# Patient Record
Sex: Female | Born: 1935 | Race: Black or African American | Hispanic: No
Health system: Midwestern US, Community
[De-identification: ages and names within clinical notes are randomized; demographics above are authoritative.]

## PROBLEM LIST (undated history)

## (undated) DIAGNOSIS — R609 Edema, unspecified: Secondary | ICD-10-CM

## (undated) DIAGNOSIS — R935 Abnormal findings on diagnostic imaging of other abdominal regions, including retroperitoneum: Secondary | ICD-10-CM

## (undated) DIAGNOSIS — G40209 Localization-related (focal) (partial) symptomatic epilepsy and epileptic syndromes with complex partial seizures, not intractable, without status epilepticus: Secondary | ICD-10-CM

## (undated) DIAGNOSIS — K319 Disease of stomach and duodenum, unspecified: Secondary | ICD-10-CM

## (undated) DIAGNOSIS — E785 Hyperlipidemia, unspecified: Secondary | ICD-10-CM

## (undated) DIAGNOSIS — E119 Type 2 diabetes mellitus without complications: Secondary | ICD-10-CM

## (undated) DIAGNOSIS — L89152 Pressure ulcer of sacral region, stage 2: Secondary | ICD-10-CM

## (undated) DIAGNOSIS — F411 Generalized anxiety disorder: Secondary | ICD-10-CM

## (undated) DIAGNOSIS — C50912 Malignant neoplasm of unspecified site of left female breast: Secondary | ICD-10-CM

## (undated) DIAGNOSIS — H409 Unspecified glaucoma: Secondary | ICD-10-CM

## (undated) DIAGNOSIS — I10 Essential (primary) hypertension: Secondary | ICD-10-CM

## (undated) DIAGNOSIS — Z853 Personal history of malignant neoplasm of breast: Secondary | ICD-10-CM

## (undated) DIAGNOSIS — Z86718 Personal history of other venous thrombosis and embolism: Secondary | ICD-10-CM

## (undated) DIAGNOSIS — Z794 Long term (current) use of insulin: Secondary | ICD-10-CM

## (undated) DIAGNOSIS — N390 Urinary tract infection, site not specified: Principal | ICD-10-CM

## (undated) DIAGNOSIS — G459 Transient cerebral ischemic attack, unspecified: Secondary | ICD-10-CM

## (undated) DIAGNOSIS — C50919 Malignant neoplasm of unspecified site of unspecified female breast: Secondary | ICD-10-CM

## (undated) DIAGNOSIS — M25561 Pain in right knee: Secondary | ICD-10-CM

## (undated) DIAGNOSIS — L299 Pruritus, unspecified: Secondary | ICD-10-CM

## (undated) DIAGNOSIS — L89309 Pressure ulcer of unspecified buttock, unspecified stage: Secondary | ICD-10-CM

## (undated) DIAGNOSIS — M1990 Unspecified osteoarthritis, unspecified site: Secondary | ICD-10-CM

## (undated) HISTORY — DX: Long term (current) use of insulin: Z79.4

## (undated) HISTORY — PX: CHOLECYSTECTOMY: SHX55

## (undated) HISTORY — DX: Pain in right knee: M25.561

## (undated) HISTORY — DX: Malignant neoplasm of unspecified site of unspecified female breast: C50.919

## (undated) HISTORY — DX: Generalized anxiety disorder: F41.1

## (undated) HISTORY — DX: Essential (primary) hypertension: I10

## (undated) HISTORY — DX: Type 2 diabetes mellitus without complications: E11.9

## (undated) HISTORY — PX: MASTECTOMY: SHX3

## (undated) HISTORY — DX: Unspecified osteoarthritis, unspecified site: M19.90

## (undated) HISTORY — DX: Unspecified glaucoma: H40.9

## (undated) HISTORY — PX: MEDIAL PARTIAL KNEE REPLACEMENT: SHX5965

## (undated) HISTORY — DX: Personal history of malignant neoplasm of breast: Z85.3

## (undated) HISTORY — PX: BREAST BIOPSY: SHX20

## (undated) HISTORY — DX: Pressure ulcer of sacral region, stage 2: L89.152

## (undated) HISTORY — DX: Pruritus, unspecified: L29.9

## (undated) HISTORY — DX: Malignant neoplasm of unspecified site of left female breast: C50.912

## (undated) HISTORY — DX: Hyperlipidemia, unspecified: E78.5

## (undated) HISTORY — DX: Urinary tract infection, site not specified: N39.0

## (undated) HISTORY — PX: ABDOMINAL HYSTERECTOMY: SHX81

## (undated) HISTORY — DX: Personal history of other venous thrombosis and embolism: Z86.718

---

## 1979-02-13 DIAGNOSIS — Z86718 Personal history of other venous thrombosis and embolism: Secondary | ICD-10-CM

## 1979-02-13 HISTORY — DX: Personal history of other venous thrombosis and embolism: Z86.718

## 2010-09-25 LAB — AMB POC PVR, MEAS,POST-VOID RES,US,NON-IMAGING: PVR: 200 cc

## 2010-09-25 LAB — AMB POC URINALYSIS DIP STICK AUTO W/O MICRO
Bilirubin (UA POC): NEGATIVE
Glucose (UA POC): NEGATIVE
Nitrites (UA POC): POSITIVE
Specific gravity (UA POC): 1.02 (ref 1.001–1.035)
Urobilinogen (UA POC): 0.2
pH (UA POC): 5.5 (ref 4.6–8.0)

## 2010-09-25 NOTE — Patient Instructions (Addendum)
MyChart Activation    Thank you for requesting access to MyChart. Please follow the instructions below to securely access and download your online medical record. MyChart allows you to send messages to your doctor, view your test results, renew your prescriptions, schedule appointments, and more.    How Do I Sign Up?    1. In your internet browser, go to [email or web address]  2. Click on the First Time User? Click Here link in the Sign In box. You will be redirect to the New Member Sign Up page.  3. Enter your MyChart Access Code exactly as it appears below. You will not need to use this code after you???ve completed the sign-up process. If you do not sign up before the expiration date, you must request a new code.    MyChart Access Code: [email or web address]  Expires: 12/24/2010 12:30 PM (This is the date your MyChart access code will expire)    4. Enter the last four digits of your Social Security Number (xxxx) and Date of Birth (mm/dd/yyyy) as indicated and click Submit. You will be taken to the next sign-up page.  5. Create a MyChart ID. This will be your MyChart login ID and cannot be changed, so think of one that is secure and easy to remember.  6. Create a MyChart password. You can change your password at any time.  7. Enter your Password Reset Question and Answer. This can be used at a later time if you forget your password.   8. Enter your e-mail address. You will receive e-mail notification when new information is available in MyChart.  9. Click Sign Up. You can now view and download portions of your medical record.  10. Click the Download Summary menu link to download a portable copy of your medical information.    Additional Information    If you have questions, please call [phone]. Remember, MyChart is NOT to be used for urgent needs. For medical emergencies, dial 911.    Have a great weekend!

## 2010-09-25 NOTE — Progress Notes (Signed)
HPI     Karen James is a 77 y.o. female  with incontinence YES ,  voiding dysfunction NO. referred by:  Karen Hides, MD  Voids 9/day, 0/night.   Describes urgency YES, urge incontinence YES.   Describes incontinence of stress variety YES. Wears 1 pads/day.Volume per leak is small to moderate.  Voiding symptoms are none.  Nocturnal enuresis YES. Dysuria YES, hematuria NO, stones NO, utis YES. Feels she does notempty well .     Other symptoms include.     Has had other GYN surgery:NO  Hysterectomy; [id number]    There is no problem list on file for this patient.    Current Outpatient Prescriptions   Medication Sig Dispense Refill   ??? hydrALAZINE (APRESOLINE) 50 mg tablet Take 25 mg by mouth three (3) times daily.         ??? torsemide (DEMADEX) 5 mg tablet Take  by mouth daily.         ??? atorvastatin (LIPITOR) 20 mg tablet Take  by mouth daily.         ??? lisinopril (PRINIVIL, ZESTRIL) 20 mg tablet Take  by mouth daily.         ??? cilostazol (PLETAL) 100 mg tablet Take  by mouth Before breakfast and dinner.         ??? metoprolol (LOPRESSOR) 50 mg tablet Take  by mouth two (2) times a day.         ??? clonazePAM (KLONOPIN) 0.5 mg tablet Take  by mouth nightly as needed.         ??? aspirin 81 mg tablet Take 81 mg by mouth.         ??? insulin mixture 75-25 (HUMALOG MIX 75-25) 100 unit/mL (75-25) Susp by SubCUTAneous route once.           No Known Allergies  Past Medical History   Diagnosis Date   ??? Hypertension    ??? Diabetes mellitus    ??? PVD (peripheral vascular disease)    ??? Breast cancer      Past Surgical History   Procedure Date   ??? Hx coronary artery bypass graft    ??? Hx cholecystectomy    ??? Hx other surgical      Bilat Legs bypass   ??? Hx mastectomy      Left Breast     History     Social History   ??? Marital Status: Widowed     Spouse Name: N/A     Number of Children: N/A   ??? Years of Education: N/A     Occupational History   ??? Not on file.     Social History Main Topics   ??? Smoking status: Never Smoker     ??? Smokeless tobacco: Never Used   ??? Alcohol Use: Not on file   ??? Drug Use: Not on file   ??? Sexually Active: Not on file     Other Topics Concern   ??? Not on file     Social History Narrative   ??? No narrative on file     No family history on file.    Review of Systems    Const: Neg for fever, neg for chills, neg for weight loss, neg for change in appetite, neg for changes in energy level  Eyes: Neg for visual disturbance, neg for pain, neg for discharge  ENT: Neg for difficulty speaking, neg for pain with swallowing, neg for hearing difficulty  Resp: Neg  for Shortness of breath, neg for cough, neg for sputum, neg for hemoptysis  Cardio: Neg for chest pain, neg for rapid heartbeat, neg for irregular heartbeat  GU: Neg for history of kidney stones, neg for frequent UTI, neg for flank pain  GI: Neg for constipation, neg for diarrhea, neg for melena, neg for hematochezia  MSK: + for bone pain, neg for muscular weakness, neg for muscular tenderness  Skin: Neg for skin conditions, neg for skin rashes  Neuro: Neg for focal weakness, neg for numbness, neg for seizures  Psych: Neg for history of psychiatric illness  Endo: Neg for diabetes, neg for thyroid disease, + for excessive urination, neg for excessive thirst, neg for heat intolerance  Lymph: Neg for frequent infections, neg for easy bruising, neg for  lymph node enlargement    OBJECTIVE    General appearance: alert, cooperative, no distress, appears stated age  Neck: supple, symmetrical, trachea midline, no adenopathy, thyroid: not enlarged, symmetric, no tenderness/mass/nodules and no JVD  Lungs: Nl respirations  Heart: RRR  Abdomen: soft, non-tender.  No masses,  no organomegaly  GU Female  Ext Genitalia:within normal limits  Urethral Meatus:within normal limits  Urethra:within normal limits,  Bladder:within normal limits  Vagina:    Atrophy YES;  DC NO; lesions NO; no cystocele; no rectocele;   Cervix:within normal limits  Uterus:within normal limits       No results found for this or any previous visit.    Results for orders placed in visit on 09/25/10   AMB POC URINALYSIS DIP STICK AUTO W/O MICRO       Component Value Range    Color Yellow  (none)     Clarity Cloudy  (none)     Glucose Negative  (none)     Bilirubin Negative  (none)     Ketones Trace  (none)     Spec.Grav. 1.020  1.001 - 1.035     Blood Trace  (none)     pH 5.5  4.6 - 8.0     Protein 100mg /dL  ZOX:WRUEAVWU(JW/JX)    Urobilinogen 0.2 mg/dL      Nitrites Positive  (none)     Leukocyte esterase 4+  (none)    AMB POC PVR, MEAS,POST-VOID RES,US,NON-IMAGING       Component Value Range    PVR 200       Domitila was seen today for new patient.    Diagnoses and associated orders for this visit:    Urinary tract infection, site not specified  - AMB POC URINALYSIS DIP STICK AUTO W/O MICRO  - CULTURE, URINE  - AMB POC PVR, MEAS,POST-VOID RES,US,NON-IMAGING    Nocturnal enuresis  - AMB POC URINALYSIS DIP STICK AUTO W/O MICRO  - CULTURE, URINE  - AMB POC PVR, MEAS,POST-VOID RES,US,NON-IMAGING    Urge incontinence  - AMB POC URINALYSIS DIP STICK AUTO W/O MICRO  - CULTURE, URINE  - AMB POC PVR, MEAS,POST-VOID RES,US,NON-IMAGING    Other Orders  - hydrALAZINE (APRESOLINE) 50 mg tablet; Take 25 mg by mouth three (3) times daily.    - torsemide (DEMADEX) 5 mg tablet; Take  by mouth daily.    - atorvastatin (LIPITOR) 20 mg tablet; Take  by mouth daily.    - lisinopril (PRINIVIL, ZESTRIL) 20 mg tablet; Take  by mouth daily.    - cilostazol (PLETAL) 100 mg tablet; Take  by mouth Before breakfast and dinner.    - metoprolol (LOPRESSOR) 50 mg tablet; Take  by mouth two (  2) times a day.    - clonazePAM (KLONOPIN) 0.5 mg tablet; Take  by mouth nightly as needed.    - aspirin 81 mg tablet; Take 81 mg by mouth.    - insulin mixture 75-25 (HUMALOG MIX 75-25) 100 unit/mL (75-25) Susp; by SubCUTAneous route once.          Will await C&S results and treat then place on prophylaxis.    Given strategies for nocturia.             Alvino Chapel MD FACS           Quality Measure--Urinary Incontinence Characterization Women Aged 2 Years and Older.  Urinary incontinence needs to be characterized at least once within 12 months for patients with a diagnosis of urinary incontinence.    Urinary Incontinence Characteristics:  leaks with urgency  Quality Measure--Urinary Incontinence Characterization Women Aged 24 Years and Older.  Urinary incontinence needs to be characterized at least once within 12 months for patients with a diagnosis of urinary incontinence.    Urinary Incontinence Characteristics:  leaks with urgency

## 2010-09-28 LAB — CULTURE, URINE

## 2010-09-28 NOTE — Telephone Encounter (Signed)
Message copied by Henreitta Leber on Mon Sep 28, 2010  3:13 PM  ------       Message from: Armen Pickup       Created: Mon Sep 28, 2010  2:51 PM         Pt has a UTI with 2 bacteria. She should begin the following treatment and prevention of further UTIs.              Septra DS, #40, 1 tab twice daily for 10 days then 1 tab daily for 6 months to prevent UTIs. Use generic and 5 refills       ----- Message -----          From: Henreitta Leber          Sent: 09/28/2010   1:04 PM            To: Alvino Chapel, MD

## 2010-10-01 MED ORDER — TRIMETHOPRIM-SULFAMETHOXAZOLE 160 MG-800 MG TAB
160-800 mg | ORAL_TABLET | ORAL | Status: DC
Start: 2010-10-01 — End: 2014-01-15

## 2010-10-01 NOTE — Telephone Encounter (Signed)
Spoke to daughter Corrie Dandy and reviewed results.  Rx sent to pharmacy on file.

## 2010-10-23 LAB — AMB POC URINALYSIS DIP STICK AUTO W/ MICRO (MICRO RESULTS)
Bilirubin (UA POC): NEGATIVE
Blood (UA POC): NEGATIVE
Glucose (UA POC): NEGATIVE
Ketones (UA POC): NEGATIVE
Nitrites (UA POC): NEGATIVE
Protein (UA POC): NEGATIVE mg/dL
Specific gravity (UA POC): 1.015 (ref 1.001–1.035)
Urobilinogen (UA POC): 0.2
pH (UA POC): 5.5 (ref 4.6–8.0)

## 2010-10-23 LAB — AMB POC PVR, MEAS,POST-VOID RES,US,NON-IMAGING: PVR: 252 cc

## 2010-10-23 NOTE — Patient Instructions (Signed)
MyChart Activation    Thank you for requesting access to MyChart. Please follow the instructions below to securely access and download your online medical record. MyChart allows you to send messages to your doctor, view your test results, renew your prescriptions, schedule appointments, and more.    How Do I Sign Up?    1. In your internet browser, go to [email or web address]  2. Click on the First Time User? Click Here link in the Sign In box. You will be redirect to the New Member Sign Up page.  3. Enter your MyChart Access Code exactly as it appears below. You will not need to use this code after you???ve completed the sign-up process. If you do not sign up before the expiration date, you must request a new code.    MyChart Access Code: [email or web address]  Expires: 12/24/2010 12:30 PM (This is the date your MyChart access code will expire)    4. Enter the last four digits of your Social Security Number (xxxx) and Date of Birth (mm/dd/yyyy) as indicated and click Submit. You will be taken to the next sign-up page.  5. Create a MyChart ID. This will be your MyChart login ID and cannot be changed, so think of one that is secure and easy to remember.  6. Create a MyChart password. You can change your password at any time.  7. Enter your Password Reset Question and Answer. This can be used at a later time if you forget your password.   8. Enter your e-mail address. You will receive e-mail notification when new information is available in MyChart.  9. Click Sign Up. You can now view and download portions of your medical record.  10. Click the Download Summary menu link to download a portable copy of your medical information.    Additional Information    Remember, MyChart is NOT to be used for urgent needs. For medical emergencies, dial 911.      Have a great weekend!

## 2010-10-23 NOTE — Progress Notes (Signed)
Progress Note    Karen James is a 77 y.o. AA female with nocturia and nocturnal enuresis.  Here for review of this problem.    She had a UTI and was treated with Septra and placed on prophylaxis. Her symptoms have not improved.    PVR today is 257 ML.    UA once again is suspicious for a UTI      Assessment:     1. Recurrent UTI  2. Incomplete bladder emptying  3. Nocturia and nocturnal enuresis       Plan:     - Lengthy discussion with pt and daughter who is a Engineer, civil (consulting). Her daughter will cath her mother prior to bedtime and in the morning.   - Will send urine for C&S  - Will see them back in 4-6 weeks.    Alvino Chapel MD FACS

## 2010-10-23 NOTE — Progress Notes (Signed)
Addended by: Nemiah Commander R on: 10/23/2010 11:40 AM     Modules accepted: Orders

## 2010-10-28 NOTE — Telephone Encounter (Signed)
Message copied by Henreitta Leber on Wed Oct 28, 2010  5:18 PM  ------       Message from: Alvino Chapel       Created: Tue Oct 27, 2010  5:36 PM         No evidence of a UTI.       ----- Message -----          From: Jabier Gauss          Sent: 10/27/2010   2:58 PM            To: Alvino Chapel, MD

## 2010-10-30 NOTE — Telephone Encounter (Signed)
Patient's daughter Corrie Dandy called back and results were reviewed.  She is stating now also that she will be unable to perform the catheterizations for her mother after all.  She would like a home health nurse to be able to come in and do this for her mother.  Will talk to Dr. Gracelyn Nurse

## 2010-11-17 LAB — PTT: aPTT: 37.3 s (ref 24.6–37.7)

## 2010-11-17 LAB — METABOLIC PANEL, BASIC
Anion gap: 5 mmol/L (ref 5–15)
BUN/Creatinine ratio: 20 (ref 12–20)
BUN: 30 MG/DL — ABNORMAL HIGH (ref 7–18)
CO2: 27 MMOL/L (ref 21–32)
Calcium: 9.5 MG/DL (ref 8.4–10.4)
Chloride: 107 MMOL/L (ref 100–108)
Creatinine: 1.5 MG/DL — ABNORMAL HIGH (ref 0.6–1.3)
GFR est AA: 42 mL/min/{1.73_m2} — ABNORMAL LOW (ref >60)
GFR est non-AA: 35 mL/min/{1.73_m2} — ABNORMAL LOW (ref >60)
Glucose: 120 MG/DL — ABNORMAL HIGH (ref 74–99)
Potassium: 4.8 MMOL/L (ref 3.5–5.5)
Sodium: 139 MMOL/L (ref 136–145)

## 2010-11-17 LAB — PROTHROMBIN TIME + INR
INR: 1 (ref 0.0–1.2)
Prothrombin time: 12.6 s (ref 11.5–15.2)

## 2010-11-17 LAB — CBC WITH AUTOMATED DIFF
ABS. BASOPHILS: 0 10*3/uL (ref 0.0–0.06)
ABS. EOSINOPHILS: 0.3 10*3/uL (ref 0.0–0.4)
ABS. LYMPHOCYTES: 2.7 10*3/uL (ref 0.9–3.6)
ABS. MONOCYTES: 0.8 10*3/uL (ref 0.05–1.2)
ABS. NEUTROPHILS: 4.8 10*3/uL (ref 1.8–8.0)
BASOPHILS: 0 % (ref 0–2)
EOSINOPHILS: 3 % (ref 0–5)
HCT: 34.8 % — ABNORMAL LOW (ref 35.0–45.0)
HGB: 11.3 g/dL — ABNORMAL LOW (ref 12.0–16.0)
LYMPHOCYTES: 31 % (ref 21–52)
MCH: 31 PG (ref 24.0–34.0)
MCHC: 32.5 g/dL (ref 31.0–37.0)
MCV: 95.6 FL (ref 74.0–97.0)
MONOCYTES: 9 % (ref 3–10)
MPV: 9.7 FL (ref 9.2–11.8)
NEUTROPHILS: 57 % (ref 40–73)
PLATELET: 314 10*3/uL (ref 135–420)
RBC: 3.64 M/uL — ABNORMAL LOW (ref 4.20–5.30)
RDW: 14 % (ref 11.6–14.5)
WBC: 8.6 10*3/uL (ref 4.6–13.2)

## 2010-11-19 LAB — POC ACTIVATED CLOTTING TIME
Activated Clotting Time (POC): 199 SECS — ABNORMAL HIGH (ref 79–138)
Activated Clotting Time (POC): 157 SECS — ABNORMAL HIGH (ref 79–138)
Activated Clotting Time (POC): 169 SECS — ABNORMAL HIGH (ref 79–138)

## 2010-11-19 LAB — GLUCOSE, POC
Glucose (POC): 106 mg/dL (ref 70–110)
Glucose (POC): 113 mg/dL — ABNORMAL HIGH (ref 70–110)

## 2010-11-19 NOTE — Op Note (Signed)
[facility]                  [address], [place], [place]  [address]                                 OPERATIVE REPORT    PATIENT:     Karen James, Karen James  MRN              [id number]   DATE:       11/19/2010  BILLING:         [id number]  LOCATION:   [id number]  ATTENDING:   Norberto Sorenson, MD  SURGEON:     Norberto Sorenson, MD      PREOPERATIVE DIAGNOSIS: Atherosclerosis with a failing right femoral to  anterior tibial artery bypass graft.    POSTOPERATIVE DIAGNOSIS: Atherosclerosis with a failing right femoral to  anterior tibial artery bypass graft.    PROCEDURE  1. Abdominal aortogram.  2. Bilateral lower extremity runoff.  3. Percutaneous transluminal angioplasty, right femoral to anterior tibial  artery bypass graft.    SURGEON: Norberto Sorenson, MD    ASSISTANTCoralie Carpen    ANESTHESIA: Moderate sedation.    INDICATIONS: This is an 77 year old female, well known to [facility]. The patient was referred to [facility] for evaluation of a right lower extremity bypass graft.  Noninvasive studies were obtained which showed an increased velocity in the  proximal bypass graft of 808 cm per second. This was consistent with a  greater than 70% stenosis. The patient was brought to the angio suite for  further intervention.    FINDINGS  1. Patent infrarenal aorta and bilateral renal arteries. There was some  mild stenosis of the right renal artery.  2. Patent bilateral common iliac, external iliac and hypogastric  arteries.  3. Right lower extremity  a. Patent right femoral to anterior tibial artery bypass graft with a  greater than 80% stenosis proximally. This underwent successful angioplasty  with a 4 x 4 and 5 x 4 balloon with less than 10% residual stenosis. The  profundus femoris and common femoral arteries were also widely patent.  b. Occluded popliteal artery.   c. Single-vessel runoff to the foot. The anterior tibial artery was patent  distal to the previous bypass graft. The peroneal and anterior tibial  arteries were occluded.  4. Left lower extremity  a. Patent common femoral and profundus femoris. The profundus femoris was  diseased with very poor collateralization distally. The superficial femoral  artery was occluded. There was reconstitution of a small portion distally.  Very poor collateralization was seen in the lower thigh.  b. The popliteal artery was reconstituted in its entirety.  c. The tibioperoneal trunk was occluded. The proximal peroneal artery was  patent, the other trifurcation vessels were occluded. There was  reconstitution of the peroneal artery distally, as well as the posterior  tibial artery. The anterior tibial artery was not seen.    DESCRIPTION OF PROCEDURE: After informed consent was obtained, the patient  was brought to the angio suite and placed in the supine position. The left  groin was prepped and draped in the usual sterile fashion. Using ultrasound  guidance, a micropuncture needle was placed into the left common femoral  artery under direct ultrasonic guidance. A microwire was advanced. The  microneedle was exchanged for a  stiffened micro catheter. The dilator and  wire were removed and an Amplatz wire was placed. The micro sheath was  exchanged for a 5-French sheath. Universal flush catheter was placed at the  level of T12 and abdominal aortogram was performed. The findings are as  above. The catheter was brought down the aortic bifurcation and a bilateral  extremity runoff was performed. The findings are as above. Next, the  catheter was advanced into the contralateral external iliac artery with a  Glidewire. The Glidewire was exchanged for an Amplatz. The 5-French sheath  was then exchanged for a 40-cm, 6-French Balkin sheath. Using a road  mapping technique, the image intensifier was placed at 33 degrees LAO. This   revealed a greater than 80% stenosis of the proximal ________ anterior  tibial artery bypass graft. The bypass graft was intubated with a  Glidewire. Next, a 4 x 4 balloon was placed over the wire. It was inflated  to 10 atmospheres and held in place for 2 minutes. This was repeated twice.  Repeat hand injection showed significant residual stenosis of greater than  50%. Next, a 5 x 4 balloon was placed over the wire and this too was was  inflated to 10 atmospheres and held in place for 2 minutes. This was  repeated twice. Repeat hand injections showed some mild residual stenosis  which was felt to be spasm. Next, 200 mcg of nitroglycerin was placed into  the sheath and a repeat hand injection showed a widely patent bypass graft  with no evidence of significant residual stenosis. At this point, the  balloons and wires were removed. The Balkin sheath was exchanged for a  short 6-French sheath. The patient tolerated the procedures well. There  were no complications. She received 5000 units of heparin.             Date:______Time:______Signature________________________________                                     Norberto Sorenson, MD    GAB:wmx  D: 11/19/2010  2:43 P T: 11/19/2010  7:23 P  Job#:  [id number]  CScriptDoc #:  [id number]  cc:   Norberto Sorenson, MD        Maye Hides, MD

## 2010-11-19 NOTE — Op Note (Signed)
[facility]                  [address] [place], [place]  [address]                                 OPERATIVE REPORT    PATIENT:     Karen James, Karen James  MRN              [id number]   DATE:       11/19/2010  BILLING:         [id number]  LOCATION:   [id number]  ATTENDING:   Norberto Sorenson, MD  SURGEON:     Norberto Sorenson, MD      PREOPERATIVE DIAGNOSIS: Atherosclerosis with a failing right femoral to  anterior tibial artery bypass graft.    POSTOPERATIVE DIAGNOSIS: Atherosclerosis with a failing right femoral to  anterior tibial artery bypass graft.    PROCEDURE  1. Abdominal aortogram.  2. Bilateral lower extremity runoff.  3. Percutaneous transluminal angioplasty, right femoral to anterior tibial  artery bypass graft.    SURGEON: Norberto Sorenson, MD    ASSISTANTCoralie Carpen    ANESTHESIA: Moderate sedation.    INDICATIONS: This is an 77 year old female, well known to [facility]. The patient was referred to [facility] for evaluation of a right lower extremity bypass graft.  Noninvasive studies were obtained which showed an increased velocity in the  proximal bypass graft of 808 cm per second. This was consistent with a  greater than 70% stenosis. The patient was brought to the angio suite for  further intervention.    FINDINGS  1. Patent infrarenal aorta and bilateral renal arteries. There was some  mild stenosis of the right renal artery.  2. Patent bilateral common iliac, external iliac and hypogastric  arteries.  3. Right lower extremity  a. Patent right femoral to anterior tibial artery bypass graft with a  greater than 80% stenosis proximally. This underwent successful angioplasty  with a 4 x 4 and 5 x 4 balloon with less than 10% residual stenosis. The  profundus femoris and common femoral arteries were also widely patent.  b. Occluded popliteal artery.  c. Single-vessel runoff to the foot. The anterior tibial  artery was patent  distal to the previous bypass graft. The peroneal and anterior tibial  arteries were occluded.  4. Left lower extremity  a. Patent common femoral and profundus femoris. The profundus femoris was  diseased with very poor collateralization distally. The superficial femoral  artery was occluded. There was reconstitution of a small portion distally.  Very poor collateralization was seen in the lower thigh.  b. The popliteal artery was reconstituted in its entirety.  c. The tibioperoneal trunk was occluded. The proximal peroneal artery was  patent, the other trifurcation vessels were occluded. There was  reconstitution of the peroneal artery distally, as well as the posterior  tibial artery. The anterior tibial artery was not seen.    DESCRIPTION OF PROCEDURE: After informed consent was obtained, the patient  was brought to the angio suite and placed in the supine position. The left  groin was prepped and draped in the usual sterile fashion. Using ultrasound  guidance, a micropuncture needle was placed into the left common femoral  artery under direct ultrasonic guidance. A microwire was advanced. The  microneedle was exchanged for a  stiffened micro catheter. The dilator and  wire were removed and an Amplatz wire was placed. The micro sheath was  exchanged for a 5-French sheath. Universal flush catheter was placed at the  level of T12 and abdominal aortogram was performed. The findings are as  above. The catheter was brought down the aortic bifurcation and a bilateral  extremity runoff was performed. The findings are as above. Next, the  catheter was advanced into the contralateral external iliac artery with a  Glidewire. The Glidewire was exchanged for an Amplatz. The 5-French sheath  was then exchanged for a 40-cm, 6-French Balkin sheath. Using a road  mapping technique, the image intensifier was placed at 33 degrees LAO. This  revealed a greater than 80% stenosis of the proximal ________  anterior  tibial artery bypass graft. The bypass graft was intubated with a  Glidewire. Next, a 4 x 4 balloon was placed over the wire. It was inflated  to 10 atmospheres and held in place for 2 minutes. This was repeated twice.  Repeat hand injection showed significant residual stenosis of greater than  50%. Next, a 5 x 4 balloon was placed over the wire and this too was was  inflated to 10 atmospheres and held in place for 2 minutes. This was  repeated twice. Repeat hand injections showed some mild residual stenosis  which was felt to be spasm. Next, 200 mcg of nitroglycerin was placed into  the sheath and a repeat hand injection showed a widely patent bypass graft  with no evidence of significant residual stenosis. At this point, the  balloons and wires were removed. The Balkin sheath was exchanged for a  short 6-French sheath. The patient tolerated the procedures well. There  were no complications. She received 5000 units of heparin.             Date:______Time:______Signature________________________________                                     Norberto Sorenson, MD    GAB:wmx  D: 11/19/2010  2:43 P T: 11/19/2010  7:23 P  Job#:  [id number]  CScriptDoc #:  [id number]  cc:   Norberto Sorenson, MD        Maye Hides, MD

## 2010-11-20 NOTE — Progress Notes (Signed)
Progress Note    Karen James is a 77 y.o. AA female with urinary incontinence. Here for review of this problem.    Since last seen the pt states that things have dramatically improved. She is doing scheduled voiding and doing much better. She is dry during the day and not wearing a pad. She has some nocturnal enuresis but not that bad.    She states she is emptying her bladder better as well. Very happy with the outcome.    With this new change in her status there is no need to pursue intermittent cath.  There is no problem list on file for this patient.    Current Outpatient Prescriptions   Medication Sig Dispense Refill   ??? trimethoprim-sulfamethoxazole (BACTRIM DS, SEPTRA DS) 160-800 mg per tablet Take one tab twice daily for 10 days and continue with one tab every day for 6 months  40 Tab  5   ??? hydrALAZINE (APRESOLINE) 50 mg tablet Take 25 mg by mouth three (3) times daily.         ??? torsemide (DEMADEX) 5 mg tablet Take  by mouth daily.         ??? atorvastatin (LIPITOR) 20 mg tablet Take  by mouth daily.         ??? lisinopril (PRINIVIL, ZESTRIL) 20 mg tablet Take  by mouth daily.         ??? cilostazol (PLETAL) 100 mg tablet Take  by mouth Before breakfast and dinner.         ??? metoprolol (LOPRESSOR) 50 mg tablet Take  by mouth two (2) times a day.         ??? clonazePAM (KLONOPIN) 0.5 mg tablet Take  by mouth nightly as needed.         ??? aspirin 81 mg tablet Take 81 mg by mouth.         ??? insulin mixture 75-25 (HUMALOG MIX 75-25) 100 unit/mL (75-25) Susp by SubCUTAneous route once.           No Known Allergies  Past Medical History   Diagnosis Date   ??? Hypertension    ??? Diabetes mellitus    ??? PVD (peripheral vascular disease)    ??? Breast cancer      Past Surgical History   Procedure Date   ??? Hx coronary artery bypass graft    ??? Hx cholecystectomy    ??? Hx other surgical      Bilat Legs bypass   ??? Hx mastectomy      Left Breast     History     Social History   ??? Marital Status: Widowed     Spouse Name: N/A      Number of Children: N/A   ??? Years of Education: N/A     Occupational History   ??? Not on file.     Social History Main Topics   ??? Smoking status: Never Smoker    ??? Smokeless tobacco: Never Used   ??? Alcohol Use: Not on file   ??? Drug Use: Not on file   ??? Sexually Active: Not on file     Other Topics Concern   ??? Not on file     Social History Narrative   ??? No narrative on file     No family history on file.        Assessment:     1. Urinary incontinence  2. UTIs  3. Nocturnal enuresis  Plan:     - Scheduled voiding  - Fluid management  - FU prn    Alvino Chapel MD FACS

## 2010-11-20 NOTE — Patient Instructions (Signed)
Please call if you need anything. Have a great weekend!

## 2011-06-28 NOTE — Telephone Encounter (Signed)
Pt does not need refill. She needed to be on prophylaxis for 6 months.

## 2012-03-01 ENCOUNTER — Emergency Department: Payer: Self-pay | Admitting: *Deleted

## 2012-03-01 LAB — COMPREHENSIVE METABOLIC PANEL
Albumin: 2.8 g/dL — ABNORMAL LOW (ref 3.4–5.0)
Anion Gap: 6 — ABNORMAL LOW (ref 7–16)
BUN: 25 mg/dL — ABNORMAL HIGH (ref 7–18)
Calcium, Total: 9.2 mg/dL (ref 8.5–10.1)
Chloride: 112 mmol/L — ABNORMAL HIGH (ref 98–107)
Co2: 27 mmol/L (ref 21–32)
EGFR (African American): 54 — ABNORMAL LOW
EGFR (Non-African Amer.): 47 — ABNORMAL LOW
Glucose: 72 mg/dL (ref 65–99)
Osmolality: 292 (ref 275–301)
Potassium: 5.6 mmol/L — ABNORMAL HIGH (ref 3.5–5.1)
Sodium: 145 mmol/L (ref 136–145)

## 2012-03-01 LAB — CBC WITH DIFFERENTIAL/PLATELET
Basophil #: 0.1 10*3/uL (ref 0.0–0.1)
Eosinophil %: 1.2 %
HCT: 37.8 % (ref 35.0–47.0)
Lymphocyte %: 18.3 %
MCH: 30.9 pg (ref 26.0–34.0)
Monocyte %: 10.3 %
Platelet: 284 10*3/uL (ref 150–440)
RBC: 4.01 10*6/uL (ref 3.80–5.20)
RDW: 13.9 % (ref 11.5–14.5)
WBC: 12.1 10*3/uL — ABNORMAL HIGH (ref 3.6–11.0)

## 2012-03-01 LAB — URINALYSIS, COMPLETE
Bilirubin,UR: NEGATIVE
Glucose,UR: NEGATIVE mg/dL (ref 0–75)
Ketone: NEGATIVE
Leukocyte Esterase: NEGATIVE
Nitrite: NEGATIVE
Ph: 5 (ref 4.5–8.0)
RBC,UR: 1 /HPF (ref 0–5)
Squamous Epithelial: 4
WBC UR: 3 /HPF (ref 0–5)

## 2012-03-01 LAB — TSH: Thyroid Stimulating Horm: 6.97 u[IU]/mL — ABNORMAL HIGH

## 2012-04-05 ENCOUNTER — Encounter

## 2012-05-23 ENCOUNTER — Encounter

## 2013-06-14 HISTORY — PX: BREAST EXCISIONAL BIOPSY: SUR124

## 2013-10-23 ENCOUNTER — Encounter

## 2014-01-10 ENCOUNTER — Inpatient Hospital Stay: Payer: MEDICARE

## 2014-01-10 NOTE — Anesthesia Pre-Procedure Evaluation (Deleted)
Anesthetic History   No history of anesthetic complications            Review of Systems / Medical History  Patient summary reviewed, nursing notes reviewed and pertinent labs reviewed    Pulmonary  Within defined limits                 Neuro/Psych   Within defined limits           Cardiovascular  Within defined limits                     GI/Hepatic/Renal  Within defined limits              Endo/Other  Within defined limits           Other Findings                   Anesthesia Plan

## 2014-01-10 NOTE — H&P (Signed)
Date of Surgery Update:  Karen James was seen and examined.  History and physical has been reviewed. There have been no significant clinical changes since the completion of the originally dated History and Physical.    Signed By: Elberta Spaniel, MD     January 10, 2014 3:48 PM

## 2014-01-10 NOTE — Anesthesia Pre-Procedure Evaluation (Deleted)
Anesthetic History   No history of anesthetic complications            Review of Systems / Medical History  Patient summary reviewed, nursing notes reviewed and pertinent labs reviewed    Pulmonary  Within defined limits                 Neuro/Psych   Within defined limits           Cardiovascular    Hypertension          CABG         GI/Hepatic/Renal  Within defined limits              Endo/Other    Diabetes         Other Findings              Physical Exam    Airway  Mallampati: II  TM Distance: 4 - 6 cm  Neck ROM: normal range of motion   Mouth opening: Normal     Cardiovascular  Regular rate and rhythm,  S1 and S2 normal,  no murmur, click, rub, or gallop             Dental  No notable dental hx       Pulmonary  Breath sounds clear to auscultation               Abdominal  Abdominal exam normal       Other Findings            Anesthetic Plan    ASA: 3  Anesthesia type: MAC            Anesthetic plan and risks discussed with: Patient

## 2014-01-11 NOTE — Anesthesia Post-Procedure Evaluation (Deleted)
Post-Anesthesia Evaluation and Assessment    Patient: Karen James MRN: [id number]  SSN: [id number]    Date of Birth: 1921/12/19  Age: 80 y.o.  Sex: female       Cardiovascular Function/Vital Signs  Visit Vitals   Item Reading   ??? BP 170/65 mmHg   ??? Pulse 62   ??? Temp 36.8 ??C (98.3 ??F)   ??? Resp 16   ??? Ht 5\' 1"  (1.549 m)   ??? Wt 73.171 kg (161 lb 5 oz)   ??? BMI 30.50 kg/m2   ??? SpO2 100%       Patient is status post MAC anesthesia for Procedure(s):  ESOPHAGOGASTRODUODENOSCOPY (EGD).    Nausea/Vomiting: None    Postoperative hydration reviewed and adequate.    Pain:  Pain Scale 1: Numeric (0 - 10) (01/10/14 1558)  Pain Intensity 1: 0 (01/10/14 1558)   Managed    Neurological Status:       At baseline    Mental Status and Level of Consciousness: Alert and oriented     Pulmonary Status:   O2 Device: None (01/10/14 1558)   Adequate oxygenation and airway patent    Complications related to anesthesia: None    Post-anesthesia assessment completed. No concerns    Signed By: Beatriz StallionIAN M Suleman Gunning, MD     January 11, 2014

## 2014-01-15 ENCOUNTER — Inpatient Hospital Stay: Payer: MEDICARE

## 2014-01-15 LAB — GLUCOSE, POC: Glucose (POC): 119 mg/dL — ABNORMAL HIGH (ref 70–110)

## 2014-01-15 MED ORDER — FLUMAZENIL 0.1 MG/ML IV SOLN
0.1 mg/mL | INTRAVENOUS | Status: DC | PRN
Start: 2014-01-15 — End: 2014-01-15

## 2014-01-15 MED ORDER — NALOXONE 0.4 MG/ML INJECTION
0.4 mg/mL | INTRAMUSCULAR | Status: DC | PRN
Start: 2014-01-15 — End: 2014-01-15

## 2014-01-15 MED ORDER — FENTANYL CITRATE (PF) 50 MCG/ML IJ SOLN
50 mcg/mL | INTRAMUSCULAR | Status: DC | PRN
Start: 2014-01-15 — End: 2014-01-15

## 2014-01-15 MED ADMIN — propofol (DIPRIVAN) 10 mg/mL injection: INTRAVENOUS | @ 16:00:00 | NDC 63323026969

## 2014-01-15 MED ADMIN — lidocaine (PF) (XYLOCAINE) 20 mg/mL (2 %) injection: INTRAVENOUS | @ 16:00:00 | NDC 00409428202

## 2014-01-15 MED ADMIN — benzocaine (HURRICAINE) 20 % spray: @ 16:00:00 | NDC 00283061043

## 2014-01-15 MED ADMIN — hydrALAZINE (APRESOLINE) 20 mg/mL injection: INTRAVENOUS | @ 18:00:00 | NDC 17478093401

## 2014-01-15 MED ADMIN — lactated ringers infusion: INTRAVENOUS | @ 16:00:00 | NDC 00409795309

## 2014-01-15 MED FILL — FENTANYL (PF) 100 MCG/2 ML (50 MCG/ML) INTRAVENOUS SYRINGE: 100 mcg/2 mL (50 mcg/mL) | INTRAVENOUS | Qty: 2

## 2014-01-15 MED FILL — LACTATED RINGERS IV: INTRAVENOUS | Qty: 1000

## 2014-01-15 MED FILL — HYDRALAZINE 20 MG/ML IJ SOLN: 20 mg/mL | INTRAMUSCULAR | Qty: 1

## 2014-01-15 MED FILL — MIDAZOLAM 1 MG/ML IJ SOLN: 1 mg/mL | INTRAMUSCULAR | Qty: 2

## 2014-01-15 NOTE — Other (Signed)
Patient and daughter were advised per Dr.Kay in earlier conversation to not take a second dose of Hydralazine this afternoon since patient was given a second dose intravenously today. Only take the third dose later this evening. This was reiterated at time of discharge. Patient and daughter verbalized understanding.

## 2014-01-15 NOTE — Other (Signed)
Dr. Benna Dunks and Dr. Joyce Gross at bedside.

## 2014-01-15 NOTE — Other (Signed)
Patient normally takes Hydralazine 3 times a day. Dr. Joyce GrossKay ordered a 10 mg dose intravenously now. Will recheck in 30 minutes.

## 2014-01-15 NOTE — Other (Signed)
Dr. Joyce GrossKay in to start IV using ultrasound machine. Successfully inserted g. 20 on left arm.

## 2014-01-15 NOTE — Other (Signed)
Dr. Shon BatonBrooks at bedside. Patient's daughter had called him earlier.

## 2014-01-15 NOTE — H&P (Signed)
Date of Surgery Update:  Karen James was seen and examined.  History and physical has been reviewed. There have been no significant clinical changes since the completion of the originally dated History and Physical.    Signed By: Elberta Spaniel, MD     January 15, 2014 12:10 PM

## 2014-01-15 NOTE — Anesthesia Pre-Procedure Evaluation (Signed)
Anesthetic History   No history of anesthetic complications            Review of Systems / Medical History  Patient summary reviewed, nursing notes reviewed and pertinent labs reviewed    Pulmonary  Within defined limits                 Neuro/Psych   Within defined limits           Cardiovascular    Hypertension: well controlled              Exercise tolerance: >4 METS     GI/Hepatic/Renal  Within defined limits              Endo/Other  Within defined limits           Other Findings              Physical Exam    Airway  Mallampati: III  TM Distance: 4 - 6 cm  Neck ROM: decreased range of motion   Mouth opening: Diminished (comment)     Cardiovascular  Regular rate and rhythm,  S1 and S2 normal,  no murmur, click, rub, or gallop  Rhythm: regular  Rate: normal         Dental    Dentition: Edentulous     Pulmonary  Breath sounds clear to auscultation               Abdominal  GI exam deferred       Other Findings            Anesthetic Plan    ASA: 3  Anesthesia type: MAC          Induction: Intravenous  Anesthetic plan and risks discussed with: Patient and Son / Daughter

## 2014-01-15 NOTE — Other (Signed)
Dr. Joyce Gross at bedside evaluating elevated BP.

## 2014-01-15 NOTE — Other (Signed)
Dr. Joyce Gross notified of patient's BP of 165/48, Pulse 61. Dr. Shon Baton has appeared and will evaluate.

## 2014-01-15 NOTE — Procedures (Signed)
EGD Procedure Note    Patient: Karen James MRN: [id number]  SSN: [id number]    Date of Birth: [birth year]  Age: 80 y.o.  Sex: female      Date of Procedure: 01/15/2014      Procedures:  EGD :    HISTORY UPDATE: History and physical has been reviewed. There have been no significant clinical changes since the completion of the originally dated History and Physical.    INDICATION:  Abnormal CT of abdomen     PROCEDURE PERFORMED: EGD    ENDOSCOPIST: Sharman Crate, MD    ASSISTANT:  Endoscopy Technician-1: Caleb Popp  Endoscopy RN-1: Percell Belt, RN    CLASSIFICATION OF PREOPERATIVE RISK: ASA 3 - Patient with moderate systemic disease with functional limitations    ANESTHESIA:  MAC anesthesia    ENDOSCOPE: [id number]                                                                                                              EXTENT OF EXAM: Second portion of the duodenum      DESCRIPTION OF PROCEDURE:   The procedure was discussed with the patient including purpose, risks, benefits and alternatives including but not limited to IV conscious sedation, bleeding, perforation and aspiration and the consent form was signed and witnessed.  A safety timeout was performed.  The patient was then given incremental doses of intravenous Demerol and Versed to achieve moderate conscious sedation.  The patient???s vital signs were monitored at all times including heart rate and rhythm, oxygen saturation, and blood pressure.  The patient was then placed into the left lateral decubitus position.  The Olympus adult diagnostic endoscope was then passed under direct visualization to the second portion of the duodenum.  The endoscope was then slowly withdrawn while closely visualizing the mucosa.  In the stomach a retroflexion was performed and gastric fundus and cardia visualized.  The scope was then removed.  The patient was then transferred to the recovery room.    FINDINGS:    Esophagus:The esophageal mucosa was normal with no ulceration, mass or stricture.  There was no evidence of Barrett's esophagus or reflux esophagitis. Z line at 40 cm.     Stomach: A 2 cm polypoid mass seen in the prepyloric area, pendunculated . Mobile, going into the pyloric channel causing a ball valve effect, multiple biopsies done to r/o malignancy and metastasis. 2-3 5 mm sessile polyps seen in the antrum with mild gastritis, biopsies done to r/o H.Pylori, malignancy.      Duodenum: The duodenum mucosa was normal with no ulceration, mass, stricture and no evidence of villous atrophy. Melanosis seen in the duodenum.     EBL: 5 cc      SPECIMENS:   ID Type Source Tests Collected by Time Destination   1 : bx pyloric mass r/o maligency, r/o metatisis Preservative Duodenum  Ivar Bury Ryne Mctigue, MD 01/15/2014 1219 Pathology       IMPRESSION: A 2 cm polypoid mass seen in the prepyloric area, pendunculated . Mobile,  going into the pyloric channel causing a ball valve effect, multiple biopsies done to r/o malignancy and metastasis. 2-3 5 mm sessile polyps seen in the antrum with mild gastritis, biopsies done to r/o H.Pylori, malignancy. Melanosis of the duodenal mucosa.    PLAN 1: Discharge when sedation criteria are met. 2.  Resume regular Diet as tolerated.  3. Follow up on the biopsy results..     Follow Up:  As scheduled.      Sharman Crate, MD  01/15/2014

## 2014-01-15 NOTE — Anesthesia Post-Procedure Evaluation (Signed)
Post-Anesthesia Evaluation & Assessment    Visit Vitals   Item Reading   ??? BP 157/60 mmHg   ??? Pulse 56   ??? Temp 36.2 ??C (97.1 ??F)   ??? Resp 18   ??? Ht 5' (1.524 m)   ??? Wt 73.029 kg (161 lb)   ??? BMI 31.44 kg/m2   ??? SpO2 98%   ??? Breastfeeding No       Nausea/Vomiting: no nausea and no vomiting    Pain score (VAS): 0    Post-operative hydration adequate.    Mental status & Level of consciousness: orientation per pre-anesthetic level    Neurological status: moves all extremities, sensation grossly intact    Pulmonary status: airway patent, no supplemental oxygen required    Complications related to anesthesia: none    Additional comments:        Jayme CloudSir Arnold M Giovan Pinsky, CRNA  January 15, 2014

## 2014-01-15 NOTE — Procedures (Signed)
Procedures  by Sharman Crate, MD at 01/15/14 1226                Author: Sharman Crate, MD  Service: Gastroenterology  Author Type: Physician       Filed: 01/15/14 1231  Date of Service: 01/15/14 1226  Status: Signed          Editor: Sharman Crate, MD (Physician)            Pre-procedure Diagnoses        1. Abnormal CT of the abdomen [793.6]                           Post-procedure Diagnoses        1. Gastric mass [537.9]        2. Gastric polyps [211.1]        3. Gastritis [535.50]                           Procedures        1. EGD [UEA5409 (Custom)]                                         EGD Procedure Note          Patient: Karen James  MRN: [id number]   SSN: [id number]          Date of Birth: 04-29-1922   Age: 80 y.o.   Sex: female         Date of Procedure: 01/15/2014         Procedures:   EGD :      HISTORY UPDATE: History and physical has been reviewed. There have been no significant clinical changes since the completion of the originally dated History and Physical.      INDICATION:  Abnormal CT of abdomen       PROCEDURE PERFORMED: EGD      ENDOSCOPIST: Sharman Crate, MD      ASSISTANT:  Endoscopy Technician-1: Caleb Popp   Endoscopy RN-1: Percell Belt, RN      CLASSIFICATION OF PREOPERATIVE RISK: ASA 3 - Patient with moderate systemic disease with functional limitations      ANESTHESIA:  MAC anesthesia      ENDOSCOPE: [id number]                                                                                                                EXTENT OF EXAM: Second portion of the duodenum        DESCRIPTION OF PROCEDURE:   The procedure was discussed with the patient including  purpose, risks, benefits and alternatives including but not limited to IV conscious sedation, bleeding, perforation and aspiration and the consent form was signed and witnessed.  A safety timeout was performed.  The patient was then given incremental  doses of  intravenous Demerol and Versed to achieve moderate conscious  sedation.  The patients vital signs were monitored at all times including heart rate and rhythm, oxygen saturation, and blood pressure.  The patient was then placed into the  left lateral decubitus position.  The Olympus adult diagnostic endoscope was then passed under direct visualization to the second portion of the duodenum.  The endoscope was then slowly withdrawn while closely visualizing the mucosa.  In the stomach a  retroflexion was performed and gastric fundus and cardia visualized.  The scope was then removed.  The patient was then transferred to the recovery room.      FINDINGS:    Esophagus:The esophageal mucosa was normal with no ulceration, mass or stricture.  There was no evidence of Barrett's esophagus or reflux  esophagitis. Z line at 40 cm.       Stomach: A 2 cm polypoid mass seen in the prepyloric area, pendunculated . Mobile, going into the pyloric channel causing a ball valve  effect, multiple biopsies done to r/o malignancy and metastasis. 2-3 5 mm sessile polyps seen in the antrum with mild gastritis, biopsies done to r/o H.Pylori, malignancy.        Duodenum: The duodenum mucosa was normal with no ulceration, mass, stricture and no evidence of villous atrophy. Melanosis seen in the  duodenum.       EBL: 5 cc         SPECIMENS:            ID  Type  Source  Tests  Collected by  Time  Destination     1 : bx pyloric mass r/o maligency, r/o metatisis  Preservative  Duodenum    Ivar Bury Jowanna Loeffler, MD  01/15/2014 1219  Pathology           IMPRESSION: A 2 cm polypoid mass seen in the prepyloric area, pendunculated . Mobile, going into the pyloric channel causing a ball valve effect, multiple biopsies done to r/o malignancy and metastasis.  2-3 5 mm sessile polyps seen in the antrum with mild gastritis, biopsies done to r/o H.Pylori, malignancy. Melanosis of the duodenal mucosa.      PLAN 1: Discharge when sedation criteria are met. 2.  Resume regular Diet as tolerated.  3. Follow up on the biopsy results..        Follow Up:   As scheduled.        Sharman Crate, MD   01/15/2014

## 2014-01-16 MED FILL — LACTATED RINGERS IV: INTRAVENOUS | Qty: 250

## 2014-01-16 MED FILL — DIPRIVAN 10 MG/ML INTRAVENOUS EMULSION: 10 mg/mL | INTRAVENOUS | Qty: 50

## 2014-01-16 MED FILL — DIPRIVAN 10 MG/ML INTRAVENOUS EMULSION: 10 mg/mL | INTRAVENOUS | Qty: 64.24

## 2014-01-16 MED FILL — LIDOCAINE (PF) 20 MG/ML (2 %) IJ SOLN: 20 mg/mL (2 %) | INTRAMUSCULAR | Qty: 2

## 2014-01-16 MED FILL — HURRICAINE ONE 20 % MUCOSAL SPRAY: 20 % | Qty: 1.5

## 2015-01-01 ENCOUNTER — Telehealth: Payer: Self-pay | Admitting: Family Medicine

## 2015-01-01 ENCOUNTER — Ambulatory Visit (INDEPENDENT_AMBULATORY_CARE_PROVIDER_SITE_OTHER): Payer: Medicare PPO | Admitting: Family Medicine

## 2015-01-01 ENCOUNTER — Encounter: Payer: Self-pay | Admitting: Family Medicine

## 2015-01-01 ENCOUNTER — Encounter (INDEPENDENT_AMBULATORY_CARE_PROVIDER_SITE_OTHER): Payer: Self-pay

## 2015-01-01 ENCOUNTER — Other Ambulatory Visit: Payer: Self-pay | Admitting: *Deleted

## 2015-01-01 VITALS — BP 118/60 | HR 66 | Temp 97.8°F | Ht 63.0 in | Wt 155.0 lb

## 2015-01-01 DIAGNOSIS — E118 Type 2 diabetes mellitus with unspecified complications: Secondary | ICD-10-CM

## 2015-01-01 DIAGNOSIS — H409 Unspecified glaucoma: Secondary | ICD-10-CM

## 2015-01-01 DIAGNOSIS — Z853 Personal history of malignant neoplasm of breast: Secondary | ICD-10-CM

## 2015-01-01 DIAGNOSIS — M1990 Unspecified osteoarthritis, unspecified site: Secondary | ICD-10-CM | POA: Insufficient documentation

## 2015-01-01 DIAGNOSIS — E785 Hyperlipidemia, unspecified: Secondary | ICD-10-CM

## 2015-01-01 DIAGNOSIS — L89152 Pressure ulcer of sacral region, stage 2: Secondary | ICD-10-CM | POA: Diagnosis not present

## 2015-01-01 DIAGNOSIS — Z01818 Encounter for other preprocedural examination: Secondary | ICD-10-CM | POA: Insufficient documentation

## 2015-01-01 DIAGNOSIS — F411 Generalized anxiety disorder: Secondary | ICD-10-CM

## 2015-01-01 DIAGNOSIS — Z0000 Encounter for general adult medical examination without abnormal findings: Secondary | ICD-10-CM

## 2015-01-01 DIAGNOSIS — M159 Polyosteoarthritis, unspecified: Secondary | ICD-10-CM

## 2015-01-01 DIAGNOSIS — I10 Essential (primary) hypertension: Secondary | ICD-10-CM

## 2015-01-01 DIAGNOSIS — C50912 Malignant neoplasm of unspecified site of left female breast: Secondary | ICD-10-CM

## 2015-01-01 DIAGNOSIS — C50512 Malignant neoplasm of lower-outer quadrant of left female breast: Secondary | ICD-10-CM | POA: Insufficient documentation

## 2015-01-01 DIAGNOSIS — E119 Type 2 diabetes mellitus without complications: Secondary | ICD-10-CM

## 2015-01-01 DIAGNOSIS — E1149 Type 2 diabetes mellitus with other diabetic neurological complication: Secondary | ICD-10-CM | POA: Insufficient documentation

## 2015-01-01 DIAGNOSIS — Z794 Long term (current) use of insulin: Secondary | ICD-10-CM

## 2015-01-01 DIAGNOSIS — M150 Primary generalized (osteo)arthritis: Secondary | ICD-10-CM

## 2015-01-01 HISTORY — DX: Unspecified glaucoma: H40.9

## 2015-01-01 HISTORY — DX: Essential (primary) hypertension: I10

## 2015-01-01 HISTORY — DX: Type 2 diabetes mellitus without complications: E11.9

## 2015-01-01 HISTORY — DX: Unspecified osteoarthritis, unspecified site: M19.90

## 2015-01-01 HISTORY — DX: Malignant neoplasm of unspecified site of left female breast: C50.912

## 2015-01-01 HISTORY — DX: Long term (current) use of insulin: Z79.4

## 2015-01-01 HISTORY — DX: Hyperlipidemia, unspecified: E78.5

## 2015-01-01 HISTORY — DX: Pressure ulcer of sacral region, stage 2: L89.152

## 2015-01-01 HISTORY — DX: Generalized anxiety disorder: F41.1

## 2015-01-01 HISTORY — DX: Personal history of malignant neoplasm of breast: Z85.3

## 2015-01-01 MED ORDER — INSULIN ASPART PROT & ASPART (70-30 MIX) 100 UNIT/ML PEN: 35.0000 [IU] | PEN_INJECTOR | Freq: Two times a day (BID) | SUBCUTANEOUS | Status: DC

## 2015-01-01 MED ORDER — INSULIN ASPART PROT & ASPART (70-30 MIX) 100 UNIT/ML PEN: PEN_INJECTOR | SUBCUTANEOUS | Status: DC

## 2015-01-01 NOTE — Assessment & Plan Note (Signed)
Obtaining lipid panel today. Patient is to continue atorvastatin 40 mg daily.

## 2015-01-01 NOTE — Assessment & Plan Note (Signed)
Patient currently undergoing treatment with Aromasin. I do not have records and therefore I'm not sure of the status of her breast cancer. Awaiting records and referring to oncology.

## 2015-01-01 NOTE — Assessment & Plan Note (Signed)
Caregiver to call back with the type and dosing of insulin.  Patient is to continue current insulin dosing until I get further information. Obtaining A1c today to evaluate.

## 2015-01-01 NOTE — Telephone Encounter (Signed)
LMTCB & instructed patient to have labs drawn @ [facility].

## 2015-01-01 NOTE — Assessment & Plan Note (Signed)
Unclear the status of the patient's preventative healthcare measures. Awaiting records.

## 2015-01-01 NOTE — Assessment & Plan Note (Signed)
Placing referral to ophthalmology.

## 2015-01-01 NOTE — Patient Instructions (Signed)
It was nice to see you today.  We will be in touch regarding her referrals to the eye doctor, oncology and to the wound center.  She should continue her current medications at this time.  Please let me know the type and dosing of her insulin.  Use tylenol 1000 mg three times daily as needed for pain.  Follow up in ~ 3 months.  Take care  Dr. Lacinda Axon

## 2015-01-01 NOTE — Assessment & Plan Note (Addendum)
Well-controlled at this time. Will continue current therapy with lisinopril, Toprol-XL, hydralazine, and torsemide. I discussed with patient and caregiver that we should consider titrating down and/or discontinuing some of these medications if her blood pressure remains well-controlled/low. Obtaining metabolic panel today.

## 2015-01-01 NOTE — Telephone Encounter (Signed)
Order faxed to [facility]

## 2015-01-01 NOTE — Assessment & Plan Note (Signed)
Advised scheduled use of Tylenol for pain. Caregiver is to call if her pain is not well controlled

## 2015-01-01 NOTE — Assessment & Plan Note (Signed)
Small wound that is not infected. Sending to wound center for evaluation and treatment.

## 2015-01-01 NOTE — Progress Notes (Signed)
Subjective:    Patient ID: [name], female    DOB: 05-22-1922, 81 y.o.   MRN: [id number]  HPI   81 year old female with a past medical history of type 2 diabetes, hypertension, hyperlipidemia, history of breast cancer (right) and current cancer of the left breast, osteoarthritis, and glaucoma presents to the clinic today to establish care. Patient currently has complaints of bilateral knee pain.  1) HTN  Well controlled.   Medications - probably XL 50 mg daily, lisinopril 20 mg twice a day, hydralazine 50 mg 4 times a day, torsemide 10 mg daily.  Compliance -  Yes.  ROS: Denies chest pain, SOB, lightheadedness/dizziness  2) HLD  Unsure if this is well controlled. Awaiting records.  Patient compliant with Lipitor with no reported side effects.   3) DM-2  Caregiver and patient unaware of current control. I am awaiting records from her PCP.  Patient is currently taking insulin daily (caregivers does not recall the type or dosing of insulin and will call me back with this information.  4) Breast cancer - left  Caregiver reports that she was diagnosed in December 2015.  After discussion of treatment options patient and family elected to just undergo oral therapy. She is currently taking Aromasin daily.  5) OA; Current left knee pain  Patient reports that she's been expressing intermittent left knee pain for the past few months.  She suffered a fall a few months ago and has had some pain intermittent since then.  Pain is mild to moderate .  Her caregiver has been giving her Aleve or Motrin with some improvement in her pain.  No other relieving factors. Pain exacerbated by certain movements/physical activity.  6) Wound  Patient's caregiver reports that for approximately 3 months she has had a wound on her bottom.  The wound is small and has not healed as of yet.  Caregiver reports that she's been treating it with local wound care and Desitin.  She has not noticed  any redness or drainage from the wound. No recent fevers or chills.  Caregiver states that she does not have any issues with incontinence.  No known exacerbating factors.  PMH, Surgical Hx, Family Hx, Social History reviewed and updated as below.  Past Medical History  Diagnosis Date  . Arthritis   . Breast cancer     Hx of R breast cancer s/p mastectomy; Currently has L breast cancer (Diagnosed 2015).   . Diabetes mellitus without complication     Insulin dependent.   . Hyperlipidemia   . Hypertension   . History of DVT (deep vein thrombosis)    Past Surgical History  Procedure Laterality Date  . Breast surgery      R mastectomy   . Cholecystectomy    . Replacement total knee Right    Family History  Problem Relation Age of Onset  . Diabetes Son   . Diabetes Son   . Heart disease Mother    History   Social History  . Marital Status: Single    Spouse Name: N/A  . Number of Children: N/A  . Years of Education: N/A   Social History Main Topics  . Smoking status: Never Smoker   . Smokeless tobacco: Not on file  . Alcohol Use: No  . Drug Use: No  . Sexual Activity: Not on file   Other Topics Concern  . None   Social History Narrative   Lives with daughter/daughter's husband.   Denita Lung [phone]).  Review of Systems Per HPI with the following additions: Patient reports visual difficulty of the right eye. All other systems negative.     Objective:   Physical Exam Filed Vitals:   01/01/15 1029  BP: 118/60  Pulse: 66  Temp: 97.8 F (36.6 C)   Vital signs reviewed. Exam: Constitutional: well appearing elderly female in no acute distress. Sitting in wheelchair. Eyes: No scleral icterus. No injection of conjunctiva. EOMI.  ENT: NCAT. Oropharynx clear with no exudate. MMM. TM's normal bilaterally.  Lungs: No increased work of breathing. CTAB. No rales, rhonchi or wheezing.  CV: RRR, no murmur. 1-2+ LE edema bilaterally.  Abdomen/GI: Soft,  non-tender; nondistended. No masses or organomegly. No rebound or guarding. MSK: Left and right knees - decreased range of motion. Midline scar noted of right knee. Nontender to palpation. Skin: Patient with approximately 1 cm circumferential stage II sacral decubitus ulcer noted.  Psych: Normal mood and affect. AO x 3. Neuro: No focal deficits.      Assessment & Plan:  See Problem List

## 2015-01-01 NOTE — Telephone Encounter (Signed)
Terri with [facility] called to state that they are unable to find any access to draw labs on pt and gave the option of [facility] lab for arterial stick. Pt was to advise to return home and wait for call from the office to advise the next step.msn

## 2015-01-02 ENCOUNTER — Other Ambulatory Visit
Admission: RE | Admit: 2015-01-02 | Discharge: 2015-01-02 | Disposition: A | Discharge status: Home / Self Care | Payer: Medicare PPO | Source: Ambulatory Visit | Attending: Family Medicine | Admitting: Family Medicine

## 2015-01-02 ENCOUNTER — Encounter: Payer: Self-pay | Admitting: Family Medicine

## 2015-01-02 DIAGNOSIS — E119 Type 2 diabetes mellitus without complications: Secondary | ICD-10-CM | POA: Diagnosis present

## 2015-01-02 DIAGNOSIS — L89159 Pressure ulcer of sacral region, unspecified stage: Secondary | ICD-10-CM | POA: Diagnosis present

## 2015-01-02 LAB — LIPID PANEL
CHOLESTEROL: 102 mg/dL (ref 0–200)
HDL: 35 mg/dL — AB (ref >40)
LDL CALC: 48 mg/dL (ref 0–99)
Total CHOL/HDL Ratio: 2.9 RATIO
Triglycerides: 94 mg/dL (ref <150)
VLDL: 19 mg/dL (ref 0–40)

## 2015-01-02 LAB — COMPREHENSIVE METABOLIC PANEL
ALT: 20 U/L (ref 14–54)
AST: 51 U/L — ABNORMAL HIGH (ref 15–41)
Albumin: 3.2 g/dL — ABNORMAL LOW (ref 3.5–5.0)
Alkaline Phosphatase: 43 U/L (ref 38–126)
Anion gap: 9 (ref 5–15)
BUN: 31 mg/dL — ABNORMAL HIGH (ref 6–20)
CO2: 23 mmol/L (ref 22–32)
Calcium: 9.7 mg/dL (ref 8.9–10.3)
Chloride: 112 mmol/L — ABNORMAL HIGH (ref 101–111)
Creatinine, Ser: 1.35 mg/dL — ABNORMAL HIGH (ref 0.44–1.00)
GFR calc Af Amer: 39 mL/min — ABNORMAL LOW (ref >60)
GFR calc non Af Amer: 33 mL/min — ABNORMAL LOW (ref >60)
GLUCOSE: 184 mg/dL — AB (ref 65–99)
Potassium: 5.8 mmol/L — ABNORMAL HIGH (ref 3.5–5.1)
Sodium: 144 mmol/L (ref 135–145)
Total Bilirubin: 1.1 mg/dL (ref 0.3–1.2)
Total Protein: 5.9 g/dL — ABNORMAL LOW (ref 6.5–8.1)

## 2015-01-02 LAB — HEMOGLOBIN A1C: Hgb A1c MFr Bld: 6.4 % — ABNORMAL HIGH (ref 4.0–6.0)

## 2015-01-02 LAB — CBC
HEMATOCRIT: 34.9 % — AB (ref 35.0–47.0)
Hemoglobin: 11.2 g/dL — ABNORMAL LOW (ref 12.0–16.0)
MCH: 30.3 pg (ref 26.0–34.0)
MCHC: 32.1 g/dL (ref 32.0–36.0)
MCV: 94.4 fL (ref 80.0–100.0)
PLATELETS: 200 10*3/uL (ref 150–440)
RBC: 3.69 MIL/uL — AB (ref 3.80–5.20)
RDW: 14.2 % (ref 11.5–14.5)
WBC: 10.7 10*3/uL (ref 3.6–11.0)

## 2015-01-03 ENCOUNTER — Telehealth: Payer: Self-pay | Admitting: Family Medicine

## 2015-01-03 NOTE — Telephone Encounter (Signed)
Spoke with patient's daughter and POA about recent lab results. I informed her that her cholesterol and diabetes are well controlled at this time. The remainder of her labs are stable except for mild elevation in creatinine.  I discussed this with the daughter and informed her to discontinue the torsemide and have her follow up with me in the next 1-2 weeks. She is in agreement and was very appreciative of the phone call.

## 2015-01-06 ENCOUNTER — Encounter: Payer: Medicare PPO | Attending: Surgery | Admitting: Surgery

## 2015-01-06 DIAGNOSIS — Z853 Personal history of malignant neoplasm of breast: Secondary | ICD-10-CM | POA: Diagnosis not present

## 2015-01-06 DIAGNOSIS — E785 Hyperlipidemia, unspecified: Secondary | ICD-10-CM | POA: Diagnosis not present

## 2015-01-06 DIAGNOSIS — I10 Essential (primary) hypertension: Secondary | ICD-10-CM | POA: Insufficient documentation

## 2015-01-06 DIAGNOSIS — M1990 Unspecified osteoarthritis, unspecified site: Secondary | ICD-10-CM | POA: Insufficient documentation

## 2015-01-06 DIAGNOSIS — E11622 Type 2 diabetes mellitus with other skin ulcer: Secondary | ICD-10-CM | POA: Insufficient documentation

## 2015-01-06 DIAGNOSIS — L89323 Pressure ulcer of left buttock, stage 3: Secondary | ICD-10-CM | POA: Diagnosis present

## 2015-01-06 NOTE — Progress Notes (Addendum)
HILLIARY, JOCK ([id number]) Visit Report for 01/06/2015 Chief Complaint Document Details Patient Name: [name], [name] Date of Service: 01/06/2015 9:00 AM Medical Record Number: [id number] Patient Account Number: [id number] Date of Birth/Sex: 11-Sep-1921 (81 y.o. Female) Treating RN: Primary Care Physician: [name] Other Clinician: Referring Physician: [name] Treating Physician/Extender: Frann Rider in Treatment: 0 Information Obtained from: Patient Chief Complaint Patient presents to the wound care center for a consult due non healing wound. 81 year old patient who comes along with her daughter for a review of a pressure ulcer on the left gluteal region for about 3 months. Electronic Signature(s) Signed: 01/06/2015 9:25:37 AM By: Christin Fudge MD, FACS Entered By: Christin Fudge on 01/06/2015 09:25:37 [name], [name] ([id number]) -------------------------------------------------------------------------------- HPI Details Patient Name: [name] Date of Service: 01/06/2015 9:00 AM Medical Record Number: [id number] Patient Account Number: [id number] Date of Birth/Sex: [birth year] (81 y.o. Female) Treating RN: Primary Care Physician: [name] Other Clinician: Referring Physician: [name] Treating Physician/Extender: Frann Rider in Treatment: 0 History of Present Illness Location: left gluteal region medially Quality: Patient reports No Pain. Severity: Patient states wound are getting worse. Duration: Patient has had the wound for > 3 months prior to seeking treatment at the wound center Timing: Pain in wound is Intermittent (comes and goes Context: The wound appeared gradually over time Modifying Factors: Other treatment(s) tried include:local ointments Associated Signs and Symptoms: Patient reports having difficulty standing for long periods. HPI Description: 81 year old female with a past medical history of type 2 diabetes, hypertension, hyperlipidemia,  history of breast cancer (right) and current cancer of the left breast, osteoarthritis, and glaucoma presents to the wound clinic today a consult of a wound on her sacral region. This has been there for about 3 months now. Her last hemoglobin A1c was 6.4 and her glucose was 184. She is not bed bound and is ambulating with help and uses a walker and sits on a lift chair for most of the day. Electronic Signature(s) Signed: 01/06/2015 9:26:53 AM By: Christin Fudge MD, FACS Previous Signature: 01/06/2015 8:11:06 AM Version By: Christin Fudge MD, FACS Entered By: Christin Fudge on 01/06/2015 09:26:53 [name], [name] ([id number]) -------------------------------------------------------------------------------- Physical Exam Details Patient Name: [name] Date of Service: 01/06/2015 9:00 AM Medical Record Number: [id number] Patient Account Number: [id number] Date of Birth/Sex: October 06, 1921 (81 y.o. Female) Treating RN: Primary Care Physician: [name] Other Clinician: Referring Physician: [name] Treating Physician/Extender: Frann Rider in Treatment: 0 Constitutional . Pulse regular. Respirations normal and unlabored. Afebrile. . Eyes Nonicteric. Reactive to light. Ears, Nose, Mouth, and Throat Lips, teeth, and gums WNL.Marland Kitchen Moist mucosa without lesions . Neck supple and nontender. No palpable supraclavicular or cervical adenopathy. Normal sized without goiter. Respiratory WNL. No retractions.. Cardiovascular Pedal Pulses WNL. No clubbing, cyanosis or edema. Gastrointestinal (GI) Abdomen without masses or tenderness.. No liver or spleen enlargement or tenderness.. Musculoskeletal Adexa without tenderness or enlargement.. Digits and nails w/o clubbing, cyanosis, infection, petechiae, ischemia, or inflammatory conditions.. Integumentary (Hair, Skin) No suspicious lesions. No crepitus or fluctuance. No peri-wound warmth or erythema. No masses.Marland Kitchen Psychiatric Judgement and insight  Intact.. No evidence of depression, anxiety, or agitation.. Notes stage III pressure injury to the left gluteal region medially. Surrounding skin is fine and there is no evidence of cellulitis. Electronic Signature(s) Signed: 01/06/2015 9:27:30 AM By: Christin Fudge MD, FACS Entered By: Christin Fudge on 01/06/2015 09:27:30 [name], [name] ([id number]) -------------------------------------------------------------------------------- Physician Orders Details Patient Name: [name] Date of Service: 01/06/2015 9:00 AM Medical Record Number: [id number] Patient Account  Number: [id number] Date of Birth/Sex: 1922/01/06 (81 y.o. Female) Treating RN: Montey Hora Primary Care Physician: [name] Other Clinician: Referring Physician: [name] Treating Physician/Extender: Frann Rider in Treatment: 0 Verbal / Phone Orders: Yes Clinician: Montey Hora Read Back and Verified: Yes Diagnosis Coding Wound Cleansing Wound #1 Left Coccyx o Clean wound with Normal Saline. o May Shower, gently pat wound dry prior to applying new dressing. Anesthetic Wound #1 Left Coccyx o Topical Lidocaine 4% cream applied to wound bed prior to debridement Skin Barriers/Peri-Wound Care Wound #1 Left Coccyx o Skin Prep Primary Wound Dressing Wound #1 Left Coccyx o Aquacel Ag Secondary Dressing Wound #1 Left Coccyx o Boardered Foam Dressing Dressing Change Frequency Wound #1 Left Coccyx o Change dressing every other day. Follow-up Appointments Wound #1 Left Coccyx o Return Appointment in 1 week. Off-Loading Wound #1 Left Coccyx o Turn and reposition every 2 hours Additional Orders / Instructions [name], [name] ([id number]) Wound #1 Left Coccyx o Increase protein intake. Notes add a multivitamin that includes: zinc, selenium and vitamin C Electronic Signature(s) Signed: 01/06/2015 12:13:47 PM By: Christin Fudge MD, FACS Signed: 01/06/2015 4:26:29 PM By: Montey Hora Entered By: Montey Hora on 01/06/2015 09:11:07 [name], [name] ([id number]) -------------------------------------------------------------------------------- Problem List Details Patient Name: [name] Date of Service: 01/06/2015 9:00 AM Medical Record Number: [id number] Patient Account Number: [id number] Date of Birth/Sex: [birth year] (81 y.o. Female) Treating RN: Primary Care Physician: [name] Other Clinician: Referring Physician: [name] Treating Physician/Extender: Frann Rider in Treatment: 0 Active Problems ICD-10 Encounter Code Description Active Date Diagnosis E11.622 Type 2 diabetes mellitus with other skin ulcer 01/06/2015 Yes L89.323 Pressure ulcer of left buttock, stage 3 01/06/2015 Yes Inactive Problems Resolved Problems Electronic Signature(s) Signed: 01/06/2015 9:24:50 AM By: Christin Fudge MD, FACS Previous Signature: 01/06/2015 9:24:44 AM Version By: Christin Fudge MD, FACS Entered By: Christin Fudge on 01/06/2015 09:24:49 [name], [name] ([id number]) -------------------------------------------------------------------------------- Progress Note Details Patient Name: [name] Date of Service: 01/06/2015 9:00 AM Medical Record Number: [id number] Patient Account Number: [id number] Date of Birth/Sex: 09-05-1921 (81 y.o. Female) Treating RN: Primary Care Physician: [name] Other Clinician: Referring Physician: [name] Treating Physician/Extender: Frann Rider in Treatment: 0 Subjective Chief Complaint Information obtained from Patient Patient presents to the wound care center for a consult due non healing wound. 81 year old patient who comes along with her daughter for a review of a pressure ulcer on the left gluteal region for about 3 months. History of Present Illness (HPI) The following HPI elements were documented for the patient's wound: Location: left gluteal region medially Quality: Patient reports No Pain. Severity:  Patient states wound are getting worse. Duration: Patient has had the wound for > 3 months prior to seeking treatment at the wound center Timing: Pain in wound is Intermittent (comes and goes Context: The wound appeared gradually over time Modifying Factors: Other treatment(s) tried include:local ointments Associated Signs and Symptoms: Patient reports having difficulty standing for long periods. 81 year old female with a past medical history of type 2 diabetes, hypertension, hyperlipidemia, history of breast cancer (right) and current cancer of the left breast, osteoarthritis, and glaucoma presents to the wound clinic today a consult of a wound on her sacral region. This has been there for about 3 months now. Her last hemoglobin A1c was 6.4 and her glucose was 184. She is not bed bound and is ambulating with help and uses a walker and sits on a lift chair for most of the day. Wound History Patient presents with  1 open wound that has been present for approximately since April. Patient has been treating wound in the following manner: desitin. Laboratory tests have not been performed in the last month. Patient reportedly has not tested positive for an antibiotic resistant organism. Patient reportedly has not tested positive for osteomyelitis. Patient reportedly has not had testing performed to evaluate circulation in the legs. Patient History Information obtained from Patient, Caregiver. Allergies No Known Allergies [name], [name] ([id number]) Family History Diabetes - Child, Heart Disease - Mother, Hypertension - Mother, No family history of Cancer, Hereditary Spherocytosis, Kidney Disease, Lung Disease, Seizures, Stroke, Thyroid Problems, Tuberculosis. Social History Never smoker, Marital Status - Widowed, Alcohol Use - Never, Drug Use - No History, Caffeine Use - Never. Medical History Eyes Patient has history of Glaucoma Cardiovascular Patient has history of  Hypertension Endocrine Patient has history of Type II Diabetes Musculoskeletal Patient has history of Osteoarthritis Oncologic Denies history of Received Chemotherapy, Received Radiation Patient is treated with Insulin. Blood sugar is tested. Medical And Surgical History Notes Cardiovascular hyperlipidemia Oncologic hx breast cancer s/p right mastectomy over 10 years ago Review of Systems (ROS) Constitutional Symptoms (General Health) The patient has no complaints or symptoms. Eyes Complains or has symptoms of Glasses / Contacts - glasses. Ear/Nose/Mouth/Throat The patient has no complaints or symptoms. Hematologic/Lymphatic The patient has no complaints or symptoms. Respiratory The patient has no complaints or symptoms. Cardiovascular Complains or has symptoms of LE edema. Gastrointestinal The patient has no complaints or symptoms. Endocrine The patient has no complaints or symptoms. Genitourinary Complains or has symptoms of Kidney failure/ Dialysis - unknown. Immunological The patient has no complaints or symptoms. Integumentary (Skin) [name], [name] ([id number]) The patient has no complaints or symptoms. Musculoskeletal The patient has no complaints or symptoms. Neurologic The patient has no complaints or symptoms. Psychiatric The patient has no complaints or symptoms. Medications aspirin 81 mg tablet,delayed release oral 1 1 tablet,delayed release (DR/EC) oral clonazepam 0.5 mg tablet oral 1 1 tablet oral atorvastatin 40 mg tablet oral 1 1 tablet oral lisinopril 20 mg tablet oral 1 1 tablet oral hydralazine 50 mg tablet oral 1 1 tablet oral exemestane 25 mg tablet oral 1 1 tablet oral Lopressor 50 mg tablet oral 1 1 tablet oral Calcium 600 + D(3) 600 mg (1,500 mg)-200 unit tablet oral tablet oral torsemide 10 mg tablet oral 1 1 tablet oral bimatoprost 0.01 % eye drops ophthalmic 1 1 drops ophthalmic brimonidine 0.1 % eye drops ophthalmic 1 1 drops  ophthalmic Women's Multivitamin Gummies 200 mcg chewable tablet oral tablet,chewable oral omeprazole 20 mg capsule,delayed release oral 1 1 capsule,delayed release(DR/EC) oral Vitamin D3 1,000 unit capsule oral 1 1 capsule oral Objective Constitutional Pulse regular. Respirations normal and unlabored. Afebrile. Vitals Time Taken: 8:33 AM, Height: 63 in, Source: Stated, Weight: 190 lbs, Source: Stated, BMI: 33.7, Temperature: 98.3 F, Pulse: 64 bpm, Respiratory Rate: 16 breaths/min, Blood Pressure: 152/50 mmHg, Capillary Blood Glucose: 89 mg/dl. Eyes Nonicteric. Reactive to light. Ears, Nose, Mouth, and Throat Lips, teeth, and gums WNL.Marland Kitchen Moist mucosa without lesions . Neck supple and nontender. No palpable supraclavicular or cervical adenopathy. Normal sized without goiter. [name], [name] ([id number]) Respiratory WNL. No retractions.. Cardiovascular Pedal Pulses WNL. No clubbing, cyanosis or edema. Gastrointestinal (GI) Abdomen without masses or tenderness.. No liver or spleen enlargement or tenderness.. Musculoskeletal Adexa without tenderness or enlargement.. Digits and nails w/o clubbing, cyanosis, infection, petechiae, ischemia, or inflammatory conditions.Marland Kitchen Psychiatric Judgement and insight Intact.. No evidence of depression, anxiety, or agitation.. General  Notes: stage III pressure injury to the left gluteal region medially. Surrounding skin is fine and there is no evidence of cellulitis. Integumentary (Hair, Skin) No suspicious lesions. No crepitus or fluctuance. No peri-wound warmth or erythema. No masses.. Wound #1 status is Open. Original cause of wound was Pressure Injury. The wound is located on the Left Gluteus. The wound measures 1.2cm length x 1.5cm width x 0.1cm depth; 1.414cm^2 area and 0.141cm^3 volume. The wound is limited to skin breakdown. There is no tunneling or undermining noted. There is a medium amount of serous drainage noted. The wound margin is flat and  intact. There is large (67-100%) red granulation within the wound bed. There is no necrotic tissue within the wound bed. The periwound skin appearance did not exhibit: Callus, Crepitus, Excoriation, Fluctuance, Friable, Induration, Localized Edema, Rash, Scarring, Dry/Scaly, Maceration, Moist, Atrophie Blanche, Cyanosis, Ecchymosis, Hemosiderin Staining, Mottled, Pallor, Rubor, Erythema. Periwound temperature was noted as No Abnormality. The periwound has tenderness on palpation. Assessment Active Problems ICD-10 E11.622 - Type 2 diabetes mellitus with other skin ulcer L89.323 - Pressure ulcer of left buttock, stage 3 [name], [name] ([id number]) This 81 year old patient was not bedbound and has limited mobility has a stage III pressure injury to the left gluteal area medially. I have recommended silver alginate and a foam pad and have discussed at length, the need to offload. I will also discussed a Roho cushion for her wheelchair and the fact that she should not be sitting in a lift chair for long periods of time. We also discussed nutrition and vitamin supplements and her daughter who is her caregiver says she would be very compliant. All questions have been answered and she will come back to see me on a regular weekly basis. Plan Wound Cleansing: Wound #1 Left Coccyx: Clean wound with Normal Saline. May Shower, gently pat wound dry prior to applying new dressing. Anesthetic: Wound #1 Left Coccyx: Topical Lidocaine 4% cream applied to wound bed prior to debridement Skin Barriers/Peri-Wound Care: Wound #1 Left Coccyx: Skin Prep Primary Wound Dressing: Wound #1 Left Coccyx: Aquacel Ag Secondary Dressing: Wound #1 Left Coccyx: Boardered Foam Dressing Dressing Change Frequency: Wound #1 Left Coccyx: Change dressing every other day. Follow-up Appointments: Wound #1 Left Coccyx: Return Appointment in 1 week. Off-Loading: Wound #1 Left Coccyx: Turn and reposition every 2  hours Additional Orders / Instructions: Wound #1 Left Coccyx: Increase protein intake. General Notes: add a multivitamin that includes: zinc, selenium and vitamin C This 81 year old patient was not bedbound and has limited mobility has a stage III pressure injury to the left gluteal area medially. I have recommended silver alginate and a foam pad and have discussed at length, the need to offload. I will also discussed a Roho cushion for her wheelchair and the fact that she should not be sitting in a lift chair for long periods of time. We also discussed nutrition and vitamin supplements and [name], [name] ([id number]) her daughter who is her caregiver says she would be very compliant. All questions have been answered and she will come back to see me on a regular weekly basis. Electronic Signature(s) Signed: 01/06/2015 3:25:56 PM By: Christin Fudge MD, FACS Previous Signature: 01/06/2015 9:29:31 AM Version By: Christin Fudge MD, FACS Entered By: Christin Fudge on 01/06/2015 15:25:56 [name], [name] ([id number]) -------------------------------------------------------------------------------- ROS/PFSH Details Patient Name: [name] Date of Service: 01/06/2015 9:00 AM Medical Record Number: [id number] Patient Account Number: [id number] Date of Birth/Sex: 18-Feb-1922 (81 y.o. Female) Treating RN: Montey Hora Primary Care Physician:  [name] Other Clinician: Referring Physician: [name] Treating Physician/Extender: Frann Rider in Treatment: 0 Information Obtained From Patient Caregiver Wound History Do you currently have one or more open woundso Yes How many open wounds do you currently haveo 1 Approximately how long have you had your woundso since April How have you been treating your wound(s) until nowo desitin Has your wound(s) ever healed and then re-openedo No Have you had any lab work done in the past montho No Have you tested positive for an antibiotic resistant organism  (MRSA, VRE)o No Have you tested positive for osteomyelitis (bone infection)o No Have you had any tests for circulation on your legso No Eyes Complaints and Symptoms: Positive for: Glasses / Contacts - glasses Medical History: Positive for: Glaucoma Cardiovascular Complaints and Symptoms: Positive for: LE edema Medical History: Positive for: Hypertension Past Medical History Notes: hyperlipidemia Genitourinary Complaints and Symptoms: Positive for: Kidney failure/ Dialysis - unknown Constitutional Symptoms (General Health) Complaints and Symptoms: No Complaints or Symptoms Ear/Nose/Mouth/Throat DANIELYS, MADRY ([id number]) Complaints and Symptoms: No Complaints or Symptoms Hematologic/Lymphatic Complaints and Symptoms: No Complaints or Symptoms Respiratory Complaints and Symptoms: No Complaints or Symptoms Gastrointestinal Complaints and Symptoms: No Complaints or Symptoms Endocrine Complaints and Symptoms: No Complaints or Symptoms Medical History: Positive for: Type II Diabetes Time with diabetes: many years Treated with: Insulin Blood sugar tested every day: Yes Tested : BID Immunological Complaints and Symptoms: No Complaints or Symptoms Integumentary (Skin) Complaints and Symptoms: No Complaints or Symptoms Musculoskeletal Complaints and Symptoms: No Complaints or Symptoms Medical History: Positive for: Osteoarthritis Neurologic Complaints and Symptoms: No Complaints or Symptoms DARNELLA, ZEITER ([id number]) Oncologic Medical History: Negative for: Received Chemotherapy; Received Radiation Past Medical History Notes: hx breast cancer s/p right mastectomy over 10 years ago Psychiatric Complaints and Symptoms: No Complaints or Symptoms HBO Extended History Items Eyes: Glaucoma Family and Social History Cancer: No; Diabetes: Yes - Child; Heart Disease: Yes - Mother; Hereditary Spherocytosis: No; Hypertension: Yes - Mother; Kidney Disease: No; Lung  Disease: No; Seizures: No; Stroke: No; Thyroid Problems: No; Tuberculosis: No; Never smoker; Marital Status - Widowed; Alcohol Use: Never; Drug Use: No History; Caffeine Use: Never; Financial Concerns: No; Food, Clothing or Shelter Needs: No; Support System Lacking: No; Transportation Concerns: No; Advanced Directives: Yes (Not Provided); Patient does not want information on Advanced Directives; Living Will: No; Medical Power of Attorney: Yes (Copy provided) Physician Affirmation I have reviewed and agree with the above information. Electronic Signature(s) Signed: 01/06/2015 9:05:40 AM By: Christin Fudge MD, FACS Signed: 01/06/2015 4:26:29 PM By: Montey Hora Entered By: Christin Fudge on 01/06/2015 09:05:39 Settle, [name] ([id number]) -------------------------------------------------------------------------------- SuperBill Details Patient Name: [name] Date of Service: 01/06/2015 Medical Record Number: [id number] Patient Account Number: [id number] Date of Birth/Sex: Jun 13, 1922 (81 y.o. Female) Treating RN: Primary Care Physician: [name] Other Clinician: Referring Physician: [name] Treating Physician/Extender: Frann Rider in Treatment: 0 Diagnosis Coding ICD-10 Codes Code Description E11.622 Type 2 diabetes mellitus with other skin ulcer L89.323 Pressure ulcer of left buttock, stage 3 Facility Procedures CPT4 Code: 29937169 Description: 99213 - WOUND CARE VISIT-LEV 3 EST PT Modifier: Quantity: 1 Physician Procedures CPT4 Code: [id number] Description: 01751 - WC PHYS LEVEL 4 - NEW PT ICD-10 Description Diagnosis E11.622 Type 2 diabetes mellitus with other skin ulcer L89.323 Pressure ulcer of left buttock, stage 3 Modifier: Quantity: 1 Electronic Signature(s) Signed: 01/06/2015 9:29:47 AM By: Christin Fudge MD, FACS Entered By: Christin Fudge on 01/06/2015 09:29:46

## 2015-01-07 ENCOUNTER — Encounter: Payer: Self-pay | Admitting: Oncology

## 2015-01-07 ENCOUNTER — Inpatient Hospital Stay: Payer: Medicare PPO | Attending: Oncology | Admitting: Oncology

## 2015-01-07 VITALS — BP 211/83 | HR 66 | Temp 95.6°F | Wt 152.0 lb

## 2015-01-07 DIAGNOSIS — Z86718 Personal history of other venous thrombosis and embolism: Secondary | ICD-10-CM | POA: Insufficient documentation

## 2015-01-07 DIAGNOSIS — Z170 Estrogen receptor positive status [ER+]: Secondary | ICD-10-CM

## 2015-01-07 DIAGNOSIS — Z79811 Long term (current) use of aromatase inhibitors: Secondary | ICD-10-CM

## 2015-01-07 DIAGNOSIS — Z853 Personal history of malignant neoplasm of breast: Secondary | ICD-10-CM

## 2015-01-07 DIAGNOSIS — Z9011 Acquired absence of right breast and nipple: Secondary | ICD-10-CM

## 2015-01-07 DIAGNOSIS — C50912 Malignant neoplasm of unspecified site of left female breast: Secondary | ICD-10-CM | POA: Insufficient documentation

## 2015-01-07 DIAGNOSIS — Z794 Long term (current) use of insulin: Secondary | ICD-10-CM | POA: Diagnosis not present

## 2015-01-07 DIAGNOSIS — E119 Type 2 diabetes mellitus without complications: Secondary | ICD-10-CM

## 2015-01-07 DIAGNOSIS — I10 Essential (primary) hypertension: Secondary | ICD-10-CM | POA: Insufficient documentation

## 2015-01-07 MED ORDER — EXEMESTANE 25 MG PO TABS: 25.0000 mg | ORAL_TABLET | Freq: Every day | ORAL | Status: DC

## 2015-01-07 NOTE — Progress Notes (Signed)
[name] ([id number]) Visit Report for 01/06/2015 Abuse/Suicide Risk Screen Details Patient Name: [name], [name] Date of Service: 01/06/2015 9:00 AM Medical Record Number: [id number] Patient Account Number: [id number] Date of Birth/Sex: 1922/04/13 (81 y.o. Female) Treating RN: Montey Hora Primary Care Physician: [name] Other Clinician: Referring Physician: [name] Treating Physician/Extender: Frann Rider in Treatment: 0 Abuse/Suicide Risk Screen Items Answer ABUSE/SUICIDE RISK SCREEN: Has anyone close to you tried to hurt or harm you recentlyo No Do you feel uncomfortable with anyone in your familyo No Has anyone forced you do things that you didnot want to doo No Do you have any thoughts of harming yourselfo No Patient displays signs or symptoms of abuse and/or neglect. No Electronic Signature(s) Signed: 01/06/2015 4:26:29 PM By: Montey Hora Entered By: Montey Hora on 01/06/2015 08:44:27 [name], [name] ([id number]) -------------------------------------------------------------------------------- Activities of Daily Living Details Patient Name: [name] Date of Service: 01/06/2015 9:00 AM Medical Record Number: [id number] Patient Account Number: [id number] Date of Birth/Sex: 09/07/1921 (81 y.o. Female) Treating RN: Montey Hora Primary Care Physician: [name] Other Clinician: Referring Physician: [name] Treating Physician/Extender: Frann Rider in Treatment: 0 Activities of Daily Living Items Answer Activities of Daily Living (Please select one for each item) Drive Automobile Not Able Take Medications Need Assistance Use Telephone Need Assistance Care for Appearance Need Assistance Use Toilet Need Assistance Bath / Shower Need Assistance Dress Self Need Assistance Feed Self Need Assistance Walk Need Assistance Get In / Out Bed Need Assistance Housework Need Assistance Prepare Meals Not Able Handle Money Need Assistance Shop for  Self Need Assistance Electronic Signature(s) Signed: 01/06/2015 4:26:29 PM By: Montey Hora Entered By: Montey Hora on 01/06/2015 08:45:30 [name], [name] ([id number]) -------------------------------------------------------------------------------- Education Assessment Details Patient Name: [name] Date of Service: 01/06/2015 9:00 AM Medical Record Number: [id number] Patient Account Number: [id number] Date of Birth/Sex: 06-03-1922 (81 y.o. Female) Treating RN: Montey Hora Primary Care Physician: [name] Other Clinician: Referring Physician: [name] Treating Physician/Extender: Frann Rider in Treatment: 0 Primary Learner Assessed: Caregiver patient is unable to Reason Patient is not Primary Learner: perform wound care Learning Preferences/Education Level/Primary Language Learning Preference: Explanation, Demonstration Highest Education Level: High School Preferred Language: English Cognitive Barrier Assessment/Beliefs Language Barrier: No Translator Needed: No Memory Deficit: No Emotional Barrier: No Cultural/Religious Beliefs Affecting Medical No Care: Physical Barrier Assessment Impaired Vision: No Impaired Hearing: No Decreased Hand dexterity: No Knowledge/Comprehension Assessment Knowledge Level: Medium Comprehension Level: Medium Ability to understand written Medium instructions: Ability to understand verbal Medium instructions: Motivation Assessment Anxiety Level: Calm Cooperation: Cooperative Education Importance: Acknowledges Need Interest in Health Problems: Asks Questions Perception: Coherent Willingness to Engage in Self- Medium Management Activities: Readiness to Engage in Self- Medium Management Activities: [name] ([id number]) Electronic Signature(s) Signed: 01/06/2015 4:26:29 PM By: Montey Hora Entered By: Montey Hora on 01/06/2015 08:46:08 [name], [name]  ([id number]) -------------------------------------------------------------------------------- Fall Risk Assessment Details Patient Name: [name] Date of Service: 01/06/2015 9:00 AM Medical Record Number: [id number] Patient Account Number: [id number] Date of Birth/Sex: 11-08-1921 (81 y.o. Female) Treating RN: Montey Hora Primary Care Physician: [name] Other Clinician: Referring Physician: [name] Treating Physician/Extender: Frann Rider in Treatment: 0 Fall Risk Assessment Items FALL RISK ASSESSMENT: History of falling - immediate or within 3 months 0 No Secondary diagnosis 0 No Ambulatory aid None/bed rest/wheelchair/nurse 0 Yes Crutches/cane/walker 0 No Furniture 0 No IV Access/Saline Lock 0 No Gait/Training Normal/bed rest/immobile 0 Yes Weak 10 Yes Impaired 0 No Mental Status Oriented to own ability 0 Yes Electronic  Signature(s) Signed: 01/06/2015 4:26:29 PM By: Montey Hora Entered By: Montey Hora on 01/06/2015 08:46:29 [name], [name] ([id number]) -------------------------------------------------------------------------------- Nutrition Risk Assessment Details Patient Name: [name] Date of Service: 01/06/2015 9:00 AM Medical Record Number: [id number] Patient Account Number: [id number] Date of Birth/Sex: 05-27-1922 (81 y.o. Female) Treating RN: Montey Hora Primary Care Physician: [name] Other Clinician: Referring Physician: [name] Treating Physician/Extender: Frann Rider in Treatment: 0 Height (in): 63 Weight (lbs): 190 Body Mass Index (BMI): 33.7 Nutrition Risk Assessment Items NUTRITION RISK SCREEN: I have an illness or condition that made me change the kind and/or 0 No amount of food I eat I eat fewer than two meals per day 0 No I eat few fruits and vegetables, or milk products 0 No I have three or more drinks of beer, liquor or wine almost every day 0 No I have tooth or mouth problems that make it hard for me  to eat 0 No I don't always have enough money to buy the food I need 0 No I eat alone most of the time 0 No I take three or more different prescribed or over-the-counter drugs a 1 Yes day Without wanting to, I have lost or gained 10 pounds in the last six 0 No months I am not always physically able to shop, cook and/or feed myself 0 No Nutrition Protocols Good Risk Protocol 0 No interventions needed Moderate Risk Protocol Electronic Signature(s) Signed: 01/06/2015 4:26:29 PM By: Montey Hora Entered By: Montey Hora on 01/06/2015 08:46:37

## 2015-01-07 NOTE — Progress Notes (Signed)
[place] @ [facility] [phone]  Fax:([phone]) [place]  Pranika Finks OB: 1921-08-27  MR#: [id number]  ZSW#:[id number]  Patient Care Team: Coral Spikes, DO as PCP - General (Family Medicine)  CHIEF COMPLAINT:  90.  81 year old lady came today further follow-up regarding carcinoma of left breast diagnosis in October of 2015 [place] ([place]) 2.  Previous history of carcinoma off right breast 10 years ago status post mastectomy no further therapy this surgery was done in [place]  VISIT DIAGNOSIS:     ICD-9-CM ICD-10-CM   1. Breast cancer, left breast 174.9 C50.912 CBC with Differential     Comprehensive metabolic panel     MM Digital Diagnostic Unilat L     US BREAST LTD UNI LEFT INC AXILLA     exemestane (AROMASIN) 25 MG tablet      No history exists.    INTERVAL HISTORY: 81 year old African-American lady came to establish 4" he care.  Patient has recently moved from [place] to [place] to be with her daughter.  Patient had previous history of forearm right breast cancer status post mastectomy 10 years ago and now recently mammogram revealed left breast cancer biopsy was done.  Exact stage is not known but family has decided to try anti-hormonal therapy.  I do not have detailed pathology report available at present time. Tolerating Aromasin very well without any significant bony pains.  REVIEW OF SYSTEMS:   Patient is in wheelchair.  Performance status is 02.  GU: No dysuria or hematuria Musculoskeletal system patient has history of arthritis and bony pains not related to Aromasin. HEENT denies any headache.  Any soreness in the mouth. Cardiac: Patient has a history of hypertension on multiple medication.  Patient also has diabetes which is insulin-dependent.  Patient also had hypercholesterolemia.  She has developed a decubitus ulcer and being referred to wound care lower extremity no edema skin: No rash.  Neurological system no  weakness. GI: No nausea no vomiting no diarrhea Psychiatric system patient is anxious on clonazepam Ambulation is limited walking with the help of walker  As per HPI. Otherwise, a complete review of systems is negatve.  PAST MEDICAL HISTORY: Past Medical History  Diagnosis Date  . Arthritis   . Breast cancer     Hx of R breast cancer s/p mastectomy; Currently has L breast cancer (Diagnosed 2015).   . Diabetes mellitus without complication     Insulin dependent.   . Hyperlipidemia   . Hypertension   . History of DVT (deep vein thrombosis)   . Glaucoma     PAST SURGICAL HISTORY: Past Surgical History  Procedure Laterality Date  . Breast surgery      R mastectomy   . Cholecystectomy    . Replacement total knee Right     FAMILY HISTORY Family History  Problem Relation Age of Onset  . Diabetes Son   . Diabetes Son   . Heart disease Mother     GYNECOLOGIC HISTORY:  No LMP recorded. Patient has had a hysterectomy.     ADVANCED DIRECTIVES: Patient does have advance healthcare directive, Patient   does not desire to make any changes   HEALTH MAINTENANCE: History  Substance Use Topics  . Smoking status: Never Smoker   . Smokeless tobacco: Not on file  . Alcohol Use: No    Last mammogram on the left breast was in October at outside institution  No Known Allergies  Current Outpatient Prescriptions  Medication Sig Dispense Refill  .  aspirin 81 MG tablet Take 81 mg by mouth daily.    Marland Kitchen atorvastatin (LIPITOR) 40 MG tablet Take 40 mg by mouth daily.    . bimatoprost (LUMIGAN) 0.01 % SOLN Place 1 drop into both eyes at bedtime.    . brimonidine (ALPHAGAN P) 0.1 % SOLN Place 1 drop into both eyes 2 (two) times daily.    . Calcium Carb-Cholecalciferol (CALCIUM 600 + D PO) Take by mouth.    . Cholecalciferol (VITAMIN D3) 1000 UNITS CAPS Take by mouth.    . clonazePAM (KLONOPIN) 0.5 MG tablet Take 0.5 mg by mouth daily.    Marland Kitchen CRANBERRY PO Take by mouth.    Marland Kitchen exemestane  (AROMASIN) 25 MG tablet Take 1 tablet (25 mg total) by mouth daily after breakfast. 30 tablet 6  . hydrALAZINE (APRESOLINE) 50 MG tablet Take 50 mg by mouth 4 (four) times daily.    . insulin aspart protamine - aspart (NOVOLOG MIX 70/30 FLEXPEN) (70-30) 100 UNIT/ML FlexPen Inject 0.35 mLs (35 Units total) into the skin 2 (two) times daily. 15 mL 11  . lisinopril (PRINIVIL,ZESTRIL) 20 MG tablet Take 20 mg by mouth 2 (two) times daily.    . metoprolol (LOPRESSOR) 50 MG tablet Take 50 mg by mouth 2 (two) times daily.    . Multiple Vitamins-Minerals (MULTIVITAMIN GUMMIES ADULT PO) Take by mouth.    Marland Kitchen omeprazole (PRILOSEC) 20 MG capsule Take 20 mg by mouth daily.    Marland Kitchen torsemide (DEMADEX) 10 MG tablet Take 10 mg by mouth daily.    . insulin aspart protamine - aspart (NOVOLOG MIX 70/30 FLEXPEN) (70-30) 100 UNIT/ML FlexPen Inject 50 units in the morning and 15 units in the evening (Patient not taking: Reported on 01/07/2015) 15 mL 11   No current facility-administered medications for this visit.    OBJECTIVE: PHYSICAL EXAM: Gen. status: No chills.  No fever.  Patient is in wheelchair.  Performance status is 0 to Head exam was generally normal. There was no scleral icterus or corneal arcus. Mucous membranes were moist. Abdominal exam revealed normal bowel sounds. The abdomen was soft, non-tender, and without masses, organomegaly, or appreciable enlargement of the abdominal aorta. Cardiac: Soft systolic murmur and occasionally irregular heart sounds Skin: Patient does have decubitus ulcer being taken care by wound care physician Examination of breast: Right breast mastectomy no evidence of recurrent disease left breast small palpable mass in left upper and outer quadrant.  Axillary lymph nodes are not palpable Lymphatic system: Supraclavicular, cervical, axillary, inguinal lymph nodes are not palpable Neurological system is difficult exam in Musculoskeletal system arthritis  Filed Vitals:   01/07/15  0858  BP: 211/83  Pulse: 66  Temp: 95.6 F (35.3 C)     Body mass index is 26.93 kg/(m^2).    ECOG FS:2 - Symptomatic, <50% confined to bed  LAB RESULTS:  Lab data was obtained in primary care physician and will get records for review     ASSESSMENT:  Carcinoma of left breast.  Status post biopsy.  Considering patient's age and family has decided just anti-hormonal therapy with Aromasin Patient is tolerating very well. We will try to get last mammogram which was according to family done in [place] in October of 2015 and repeat mammogram in October of 2016 locally Will get pathology report from [place]:   Repeat mammogram and lab in 6 month continue Aromasin Coordinated care with family physician Opted in all the records from the [facility]  Patient expressed understanding and was in agreement with this plan. She also understands that She can call clinic at any time with any questions, concerns, or complaints.    No matching staging information was found for the patient.  Forest Gleason, MD   01/07/2015 12:56 PM

## 2015-01-07 NOTE — Progress Notes (Signed)
THI, KLICH ([id number]) Visit Report for 01/06/2015 Allergy List Details Patient Name: [name], [name] Date of Service: 01/06/2015 9:00 AM Medical Record Number: [id number] Patient Account Number: [id number] Date of Birth/Sex: 06-24-1921 (81 y.o. Female) Treating RN: Montey Hora Primary Care Physician: [name] Other Clinician: Referring Physician: [name] Treating Physician/Extender: Frann Rider in Treatment: 0 Allergies Active Allergies No Known Allergies Allergy Notes Electronic Signature(s) Signed: 01/06/2015 4:26:29 PM By: Montey Hora Entered By: Montey Hora on 01/06/2015 08:37:15 Filice, [name] ([id number]) -------------------------------------------------------------------------------- Arrival Information Details Patient Name: [name] Date of Service: 01/06/2015 9:00 AM Medical Record Number: [id number] Patient Account Number: [id number] Date of Birth/Sex: [birth year] (81 y.o. Female) Treating RN: Montey Hora Primary Care Physician: [name] Other Clinician: Referring Physician: [name] Treating Physician/Extender: Frann Rider in Treatment: 0 Visit Information Patient Arrived: Wheel Chair Arrival Time: 08:31 Accompanied By: dtr Transfer Assistance: Manual Patient Identification Verified: Yes Secondary Verification Process Yes Completed: Patient Has Alerts: Yes Patient Alerts: DMII Electronic Signature(s) Signed: 01/06/2015 4:26:29 PM By: Montey Hora Entered By: Montey Hora on 01/06/2015 08:33:27 Hawthorne, [name] ([id number]) -------------------------------------------------------------------------------- Clinic Level of Care Assessment Details Patient Name: [name] Date of Service: 01/06/2015 9:00 AM Medical Record Number: [id number] Patient Account Number: [id number] Date of Birth/Sex: 1921-08-24 (81 y.o. Female) Treating RN: Montey Hora Primary Care Physician: [name] Other Clinician: Referring Physician:  [name] Treating Physician/Extender: Frann Rider in Treatment: 0 Clinic Level of Care Assessment Items TOOL 2 Quantity Score []  - Use when only an EandM is performed on the INITIAL visit 0 ASSESSMENTS - Nursing Assessment / Reassessment X - General Physical Exam (combine w/ comprehensive assessment (listed just 1 20 below) when performed on new pt. evals) X - Comprehensive Assessment (HX, ROS, Risk Assessments, Wounds Hx, etc.) 1 25 ASSESSMENTS - Wound and Skin Assessment / Reassessment X - Simple Wound Assessment / Reassessment - one wound 1 5 []  - Complex Wound Assessment / Reassessment - multiple wounds 0 []  - Dermatologic / Skin Assessment (not related to wound area) 0 ASSESSMENTS - Ostomy and/or Continence Assessment and Care []  - Incontinence Assessment and Management 0 []  - Ostomy Care Assessment and Management (repouching, etc.) 0 PROCESS - Coordination of Care X - Simple Patient / Family Education for ongoing care 1 15 []  - Complex (extensive) Patient / Family Education for ongoing care 0 X - Staff obtains Consents, Records, Test Results / Process Orders 1 10 []  - Staff telephones HHA, Nursing Homes / Clarify orders / etc 0 []  - Routine Transfer to another Facility (non-emergent condition) 0 []  - Routine Hospital Admission (non-emergent condition) 0 []  - New Admissions / Biomedical engineer / Ordering NPWT, Apligraf, etc. 0 []  - Emergency Hospital Admission (emergent condition) 0 X - Simple Discharge Coordination 1 10 Wardrop, Justene ([id number]) []  - Complex (extensive) Discharge Coordination 0 PROCESS - Special Needs []  - Pediatric / Minor Patient Management 0 []  - Isolation Patient Management 0 []  - Hearing / Language / Visual special needs 0 []  - Assessment of Community assistance (transportation, D/C planning, etc.) 0 []  - Additional assistance / Altered mentation 0 []  - Support Surface(s) Assessment (bed, cushion, seat, etc.) 0 INTERVENTIONS - Wound  Cleansing / Measurement X - Wound Imaging (photographs - any number of wounds) 1 5 []  - Wound Tracing (instead of photographs) 0 X - Simple Wound Measurement - one wound 1 5 []  - Complex Wound Measurement - multiple wounds 0 X - Simple Wound Cleansing - one wound 1 5 []  -  Complex Wound Cleansing - multiple wounds 0 INTERVENTIONS - Wound Dressings X - Small Wound Dressing one or multiple wounds 1 10 []  - Medium Wound Dressing one or multiple wounds 0 []  - Large Wound Dressing one or multiple wounds 0 []  - Application of Medications - injection 0 INTERVENTIONS - Miscellaneous []  - External ear exam 0 []  - Specimen Collection (cultures, biopsies, blood, body fluids, etc.) 0 []  - Specimen(s) / Culture(s) sent or taken to Lab for analysis 0 []  - Patient Transfer (multiple staff / Harrel Lemon Lift / Similar devices) 0 []  - Simple Staple / Suture removal (25 or less) 0 []  - Complex Staple / Suture removal (26 or more) 0 Flight, Catheryne ([id number]) []  - Hypo / Hyperglycemic Management (close monitor of Blood Glucose) 0 []  - Ankle / Brachial Index (ABI) - do not check if billed separately 0 Has the patient been seen at the hospital within the last three years: Yes Total Score: 110 Level Of Care: New/Established - Level 3 Electronic Signature(s) Signed: 01/06/2015 4:26:29 PM By: Montey Hora Entered By: Montey Hora on 01/06/2015 09:11:53 Doolan, [name] ([id number]) -------------------------------------------------------------------------------- Encounter Discharge Information Details Patient Name: [name] Date of Service: 01/06/2015 9:00 AM Medical Record Number: [id number] Patient Account Number: [id number] Date of Birth/Sex: [birth year] (81 y.o. Female) Treating RN: Montey Hora Primary Care Physician: [name] Other Clinician: Referring Physician: [name] Treating Physician/Extender: Frann Rider in Treatment: 0 Encounter Discharge Information Items Discharge Pain Level:  0 Discharge Condition: Stable Ambulatory Status: Wheelchair Discharge Destination: Home Private Transportation: Auto Accompanied By: dtr Schedule Follow-up Appointment: Yes Medication Reconciliation completed and No provided to Patient/Care Shareese Macha: Clinical Summary of Care: Electronic Signature(s) Signed: 01/06/2015 4:26:29 PM By: Montey Hora Entered By: Montey Hora on 01/06/2015 09:12:47 Rasmusson, [name] ([id number]) -------------------------------------------------------------------------------- Multi Wound Chart Details Patient Name: [name] Date of Service: 01/06/2015 9:00 AM Medical Record Number: [id number] Patient Account Number: [id number] Date of Birth/Sex: June 16, 1921 (81 y.o. Female) Treating RN: Montey Hora Primary Care Physician: [name] Other Clinician: Referring Physician: [name] Treating Physician/Extender: Frann Rider in Treatment: 0 Vital Signs Height(in): 63 Capillary Blood 89 Glucose(mg/dl): Weight(lbs): 190 Pulse(bpm): 73 Body Mass Index(BMI): 34 Blood Pressure Temperature(F): 98.3 152/50 (mmHg): Respiratory Rate 16 (breaths/min): Photos: [1:No Photos] [N/A:N/A] Wound Location: [1:Left Coccyx] [N/A:N/A] Wounding Event: [1:Pressure Injury] [N/A:N/A] Primary Etiology: [1:Pressure Ulcer] [N/A:N/A] Comorbid History: [1:Glaucoma, Hypertension, Type II Diabetes, Osteoarthritis] [N/A:N/A] Date Acquired: [1:09/13/2014] [N/A:N/A] Weeks of Treatment: [1:0] [N/A:N/A] Wound Status: [1:Open] [N/A:N/A] Measurements L x W x D 1.2x1.5x0.1 [N/A:N/A] (cm) Area (cm) : [1:1.414] [N/A:N/A] Volume (cm) : [1:0.141] [N/A:N/A] % Reduction in Area: [1:0.00%] [N/A:N/A] % Reduction in Volume: 0.00% [N/A:N/A] Classification: [1:Category/Stage II] [N/A:N/A] Exudate Amount: [1:Medium] [N/A:N/A] Exudate Type: [1:Serous] [N/A:N/A] Exudate Color: [1:amber] [N/A:N/A] Wound Margin: [1:Flat and Intact] [N/A:N/A] Granulation Amount: [1:Large  (67-100%)] [N/A:N/A] Granulation Quality: [1:Red] [N/A:N/A] Necrotic Amount: [1:None Present (0%)] [N/A:N/A] Exposed Structures: [1:Fascia: No Fat: No Tendon: No Muscle: No Joint: No Bone: No] [N/A:N/A] Limited to Skin Breakdown Epithelialization: Small (1-33%) N/A N/A Periwound Skin Texture: Edema: No N/A N/A Excoriation: No Induration: No Callus: No Crepitus: No Fluctuance: No Friable: No Rash: No Scarring: No Periwound Skin Maceration: No N/A N/A Moisture: Moist: No Dry/Scaly: No Periwound Skin Color: Atrophie Blanche: No N/A N/A Cyanosis: No Ecchymosis: No Erythema: No Hemosiderin Staining: No Mottled: No Pallor: No Rubor: No Temperature: No Abnormality N/A N/A Tenderness on Yes N/A N/A Palpation: Wound Preparation: Ulcer Cleansing: N/A N/A Rinsed/Irrigated with Saline Topical Anesthetic Applied: Other: lidocaine 4%  Treatment Notes Electronic Signature(s) Signed: 01/06/2015 4:26:29 PM By: Montey Hora Entered By: Montey Hora on 01/06/2015 09:09:28 [name] ([id number]) -------------------------------------------------------------------------------- Multi-Disciplinary Care Plan Details Patient Name: [name] Date of Service: 01/06/2015 9:00 AM Medical Record Number: [id number] Patient Account Number: [id number] Date of Birth/Sex: [birth year] (81 y.o. Female) Treating RN: Montey Hora Primary Care Physician: [name] Other Clinician: Referring Physician: [name] Treating Physician/Extender: Frann Rider in Treatment: 0 Active Inactive Abuse / Safety / Falls / Self Care Management Nursing Diagnoses: Potential for falls Goals: Patient will remain injury free Date Initiated: 01/06/2015 Goal Status: Active Interventions: Assess fall risk on admission and as needed Notes: Orientation to the Wound Care Program Nursing Diagnoses: Knowledge deficit related to the wound healing center program Goals: Patient/caregiver will verbalize  understanding of the [facility] Date Initiated: 01/06/2015 Goal Status: Active Interventions: Provide education on orientation to the wound center Notes: Pressure Nursing Diagnoses: Knowledge deficit related to management of pressures ulcers Potential for impaired tissue integrity related to pressure, friction, moisture, and shear Goals: Patient will remain free from development of additional pressure ulcers FLOYCE, BUJAK ([id number]) Date Initiated: 01/06/2015 Goal Status: Active Interventions: Assess offloading mechanisms upon admission and as needed Assess potential for pressure ulcer upon admission and as needed Provide education on pressure ulcers Notes: Wound/Skin Impairment Nursing Diagnoses: Impaired tissue integrity Goals: Ulcer/skin breakdown will have a volume reduction of 30% by week 4 Date Initiated: 01/06/2015 Goal Status: Active Ulcer/skin breakdown will have a volume reduction of 50% by week 8 Date Initiated: 01/06/2015 Goal Status: Active Ulcer/skin breakdown will have a volume reduction of 80% by week 12 Date Initiated: 01/06/2015 Goal Status: Active Ulcer/skin breakdown will heal within 14 weeks Date Initiated: 01/06/2015 Goal Status: Active Interventions: Assess ulceration(s) every visit Notes: Electronic Signature(s) Signed: 01/06/2015 4:26:29 PM By: Montey Hora Entered By: Montey Hora on 01/06/2015 09:09:16 Husband, [name] ([id number]) -------------------------------------------------------------------------------- Patient/Caregiver Education Details Patient Name: [name] Date of Service: 01/06/2015 9:00 AM Medical Record Number: [id number] Patient Account Number: [id number] Date of Birth/Gender: 1921-09-09 (81 y.o. Female) Treating RN: Montey Hora Primary Care Physician: [name] Other Clinician: Referring Physician: [name] Treating Physician/Extender: Frann Rider in Treatment: 0 Education  Assessment Education Provided To: Patient and Caregiver Education Topics Provided Offloading: Handouts: Other: pressure relief and offloading Methods: Explain/Verbal Responses: State content correctly Wound/Skin Impairment: Handouts: Other: wound care as ordered Methods: Demonstration, Explain/Verbal Responses: State content correctly Electronic Signature(s) Signed: 01/06/2015 4:26:29 PM By: Montey Hora Entered By: Montey Hora on 01/06/2015 09:22:16 Steury, [name] ([id number]) -------------------------------------------------------------------------------- Wound Assessment Details Patient Name: [name] Date of Service: 01/06/2015 9:00 AM Medical Record Number: [id number] Patient Account Number: [id number] Date of Birth/Sex: May 28, 1922 (81 y.o. Female) Treating RN: Montey Hora Primary Care Physician: [name] Other Clinician: Referring Physician: [name] Treating Physician/Extender: Frann Rider in Treatment: 0 Wound Status Wound Number: 1 Primary Pressure Ulcer Etiology: Wound Location: Left Gluteus Wound Open Wounding Event: Pressure Injury Status: Date Acquired: 09/13/2014 Comorbid Glaucoma, Hypertension, Type II Weeks Of Treatment: 0 History: Diabetes, Osteoarthritis Clustered Wound: No Photos Photo Uploaded By: Montey Hora on 01/06/2015 11:08:53 Wound Measurements Length: (cm) 1.2 Width: (cm) 1.5 Depth: (cm) 0.1 Area: (cm) 1.414 Volume: (cm) 0.141 % Reduction in Area: 0% % Reduction in Volume: 0% Epithelialization: Small (1-33%) Tunneling: No Undermining: No Wound Description Classification: Category/Stage III Wound Margin: Flat and Intact Exudate Amount: Medium Exudate Type: Serous Exudate Color: amber Foul Odor After Cleansing: No Wound Bed Granulation Amount: Large (67-100%) Exposed  Structure Granulation Quality: Red Fascia Exposed: No Necrotic Amount: None Present (0%) Fat Layer Exposed: No Tendon Exposed:  No Clapsaddle, Bette ([id number]) Muscle Exposed: No Joint Exposed: No Bone Exposed: No Limited to Skin Breakdown Periwound Skin Texture Texture Color No Abnormalities Noted: No No Abnormalities Noted: No Callus: No Atrophie Blanche: No Crepitus: No Cyanosis: No Excoriation: No Ecchymosis: No Fluctuance: No Erythema: No Friable: No Hemosiderin Staining: No Induration: No Mottled: No Localized Edema: No Pallor: No Rash: No Rubor: No Scarring: No Temperature / Pain Moisture Temperature: No Abnormality No Abnormalities Noted: No Tenderness on Palpation: Yes Dry / Scaly: No Maceration: No Moist: No Wound Preparation Ulcer Cleansing: Rinsed/Irrigated with Saline Topical Anesthetic Applied: Other: lidocaine 4%, Treatment Notes Wound #1 (Left Coccyx) 1. Cleansed with: Clean wound with Normal Saline 2. Anesthetic Topical Lidocaine 4% cream to wound bed prior to debridement 3. Peri-wound Care: Skin Prep 4. Dressing Applied: Aquacel Ag 5. Secondary Dressing Applied Bordered Foam Dressing Electronic Signature(s) Signed: 01/06/2015 9:30:00 AM By: Montey Hora Entered By: Montey Hora on 01/06/2015 09:30:00 Stgermaine, [name] ([id number]) -------------------------------------------------------------------------------- Vitals Details Patient Name: [name] Date of Service: 01/06/2015 9:00 AM Medical Record Number: [id number] Patient Account Number: [id number] Date of Birth/Sex: [birth year] (81 y.o. Female) Treating RN: Montey Hora Primary Care Physician: [name] Other Clinician: Referring Physician: [name] Treating Physician/Extender: Frann Rider in Treatment: 0 Vital Signs Time Taken: 08:33 Temperature (F): 98.3 Height (in): 63 Pulse (bpm): 64 Source: Stated Respiratory Rate (breaths/min): 16 Weight (lbs): 190 Blood Pressure (mmHg): 152/50 Source: Stated Capillary Blood Glucose (mg/dl): 89 Body Mass Index (BMI): 33.7 Reference Range: 80 -  120 mg / dl Electronic Signature(s) Signed: 01/06/2015 4:26:29 PM By: Montey Hora Entered By: Montey Hora on 01/06/2015 08:36:51

## 2015-01-07 NOTE — Progress Notes (Signed)
Patient does have living will.  Never smoked.  Patient referred by Dr. Lacinda Axon for breast cancer.

## 2015-01-13 ENCOUNTER — Telehealth: Payer: Self-pay | Admitting: Family Medicine

## 2015-01-13 ENCOUNTER — Encounter: Payer: Medicare PPO | Attending: Surgery | Admitting: Surgery

## 2015-01-13 DIAGNOSIS — I10 Essential (primary) hypertension: Secondary | ICD-10-CM | POA: Diagnosis not present

## 2015-01-13 DIAGNOSIS — E11622 Type 2 diabetes mellitus with other skin ulcer: Secondary | ICD-10-CM | POA: Diagnosis not present

## 2015-01-13 DIAGNOSIS — M1990 Unspecified osteoarthritis, unspecified site: Secondary | ICD-10-CM | POA: Diagnosis not present

## 2015-01-13 DIAGNOSIS — L89323 Pressure ulcer of left buttock, stage 3: Secondary | ICD-10-CM | POA: Diagnosis present

## 2015-01-13 DIAGNOSIS — E785 Hyperlipidemia, unspecified: Secondary | ICD-10-CM | POA: Insufficient documentation

## 2015-01-13 DIAGNOSIS — C50912 Malignant neoplasm of unspecified site of left female breast: Secondary | ICD-10-CM | POA: Insufficient documentation

## 2015-01-13 NOTE — Progress Notes (Signed)
SHAYLEN, NEPHEW ([id number]) Visit Report for 01/13/2015 Chief Complaint Document Details Patient Name: [name], [name] Date of Service: 01/13/2015 8:45 AM Medical Record Number: [id number] Patient Account Number: [id number] Date of Birth/Sex: 1921/11/30 (81 y.o. Female) Treating RN: Primary Care Physician: [name] Other Clinician: Referring Physician: [name] Treating Physician/Extender: Frann Rider in Treatment: 1 Information Obtained from: Patient Chief Complaint Patient presents to the wound care center for a consult due non healing wound. 81 year old patient who comes along with her daughter for a review of a pressure ulcer on the left gluteal region for about 3 months. Electronic Signature(s) Signed: 01/13/2015 9:04:07 AM By: Christin Fudge MD, FACS Entered By: Christin Fudge on 01/13/2015 09:04:07 LAURAJEAN, HOSEK ([id number]) -------------------------------------------------------------------------------- HPI Details Patient Name: [name] Date of Service: 01/13/2015 8:45 AM Medical Record Number: [id number] Patient Account Number: [id number] Date of Birth/Sex: 1921/07/22 (81 y.o. Female) Treating RN: Primary Care Physician: [name] Other Clinician: Referring Physician: [name] Treating Physician/Extender: Frann Rider in Treatment: 1 History of Present Illness Location: left gluteal region medially Quality: Patient reports No Pain. Severity: Patient states wound are getting worse. Duration: Patient has had the wound for > 3 months prior to seeking treatment at the wound center Timing: Pain in wound is Intermittent (comes and goes Context: The wound appeared gradually over time Modifying Factors: Other treatment(s) tried include:local ointments Associated Signs and Symptoms: Patient reports having difficulty standing for long periods. HPI Description: 81 year old female with a past medical history of type 2 diabetes, hypertension, hyperlipidemia, history  of breast cancer (right) and current cancer of the left breast, osteoarthritis, and glaucoma presents to the wound clinic today a consult of a wound on her sacral region. This has been there for about 3 months now. Her last hemoglobin A1c was 6.4 and her glucose was 184. She is not bed bound and is ambulating with help and uses a walker and sits on a lift chair for most of the day. Electronic Signature(s) Signed: 01/13/2015 9:04:15 AM By: Christin Fudge MD, FACS Entered By: Christin Fudge on 01/13/2015 09:04:15 WELMA, MCCOMBS ([id number]) -------------------------------------------------------------------------------- Physical Exam Details Patient Name: [name] Date of Service: 01/13/2015 8:45 AM Medical Record Number: [id number] Patient Account Number: [id number] Date of Birth/Sex: [birth year] (81 y.o. Female) Treating RN: Primary Care Physician: [name] Other Clinician: Referring Physician: [name] Treating Physician/Extender: Frann Rider in Treatment: 1 Constitutional . Pulse regular. Respirations normal and unlabored. Afebrile. . Eyes Nonicteric. Reactive to light. Ears, Nose, Mouth, and Throat Lips, teeth, and gums WNL.Marland Kitchen Moist mucosa without lesions . Neck supple and nontender. No palpable supraclavicular or cervical adenopathy. Normal sized without goiter. Respiratory WNL. No retractions.. Cardiovascular Pedal Pulses WNL. No clubbing, cyanosis or edema. Lymphatic No adneopathy. No adenopathy. No adenopathy. Musculoskeletal Adexa without tenderness or enlargement.. Digits and nails w/o clubbing, cyanosis, infection, petechiae, ischemia, or inflammatory conditions.. Integumentary (Hair, Skin) No suspicious lesions. No crepitus or fluctuance. No peri-wound warmth or erythema. No masses.Marland Kitchen Psychiatric Judgement and insight Intact.. No evidence of depression, anxiety, or agitation.. Notes excellent healing of the pressure wound on the left gluteal region with good  epithelialization and surrounding skin. Electronic Signature(s) Signed: 01/13/2015 9:04:51 AM By: Christin Fudge MD, FACS Entered By: Christin Fudge on 01/13/2015 09:04:51 [name] ([id number]) -------------------------------------------------------------------------------- Physician Orders Details Patient Name: [name] Date of Service: 01/13/2015 8:45 AM Medical Record Patient Account Number: [id number] Number: Afful, RN, BSN, Treating RN: [birth year] (81 y.o. Velva Harman Date of Birth/Sex: Female) Other Clinician: Primary Care Physician: [name]  Treating Camary Sosa Referring Physician: [name] Physician/Extender: [name] in Treatment: 1 Verbal / Phone Orders: Yes Clinician: Afful, RN, BSN, Rita Read Back and Verified: Yes Diagnosis Coding Wound Cleansing Wound #1 Left Gluteus o Clean wound with Normal Saline. o May Shower, gently pat wound dry prior to applying new dressing. Anesthetic Wound #1 Left Gluteus o Topical Lidocaine 4% cream applied to wound bed prior to debridement Skin Barriers/Peri-Wound Care Wound #1 Left Gluteus o Skin Prep Primary Wound Dressing Wound #1 Left Gluteus o Prisma Ag Secondary Dressing Wound #1 Left Gluteus o Boardered Foam Dressing Dressing Change Frequency Wound #1 Left Gluteus o Change dressing every other day. Follow-up Appointments Wound #1 Left Gluteus o Return Appointment in 1 week. Off-Loading Wound #1 Left Gluteus o Turn and reposition every 2 hours Deadwyler, Omayra ([id number]) Additional Orders / Instructions Wound #1 Left Gluteus o Increase protein intake. Electronic Signature(s) Signed: 01/13/2015 12:31:06 PM By: Christin Fudge MD, FACS Signed: 01/13/2015 3:44:26 PM By: Regan Lemming BSN, RN Entered By: Regan Lemming on 01/13/2015 08:59:42 DENECIA, BRUNETTE ([id number]) -------------------------------------------------------------------------------- Problem List Details Patient Name: [name] Date of Service: 01/13/2015 8:45 AM Medical Record Number: [id number] Patient Account Number: [id number] Date of Birth/Sex: 06-Sep-1921 (81 y.o. Female) Treating RN: Primary Care Physician: [name] Other Clinician: Referring Physician: [name] Treating Physician/Extender: Frann Rider in Treatment: 1 Active Problems ICD-10 Encounter Code Description Active Date Diagnosis E11.622 Type 2 diabetes mellitus with other skin ulcer 01/06/2015 Yes L89.323 Pressure ulcer of left buttock, stage 3 01/06/2015 Yes Inactive Problems Resolved Problems Electronic Signature(s) Signed: 01/13/2015 9:03:58 AM By: Christin Fudge MD, FACS Entered By: Christin Fudge on 01/13/2015 09:03:58 Arndt, [name] ([id number]) -------------------------------------------------------------------------------- Progress Note Details Patient Name: [name] Date of Service: 01/13/2015 8:45 AM Medical Record Number: [id number] Patient Account Number: [id number] Date of Birth/Sex: 1922/02/02 (81 y.o. Female) Treating RN: Primary Care Physician: [name] Other Clinician: Referring Physician: [name] Treating Physician/Extender: Frann Rider in Treatment: 1 Subjective Chief Complaint Information obtained from Patient Patient presents to the wound care center for a consult due non healing wound. 81 year old patient who comes along with her daughter for a review of a pressure ulcer on the left gluteal region for about 3 months. History of Present Illness (HPI) The following HPI elements were documented for the patient's wound: Location: left gluteal region medially Quality: Patient reports No Pain. Severity: Patient states wound are getting worse. Duration: Patient has had the wound for > 3 months prior to seeking treatment at the wound center Timing: Pain in wound is Intermittent (comes and goes Context: The wound appeared gradually over time Modifying Factors: Other treatment(s) tried  include:local ointments Associated Signs and Symptoms: Patient reports having difficulty standing for long periods. 81 year old female with a past medical history of type 2 diabetes, hypertension, hyperlipidemia, history of breast cancer (right) and current cancer of the left breast, osteoarthritis, and glaucoma presents to the wound clinic today a consult of a wound on her sacral region. This has been there for about 3 months now. Her last hemoglobin A1c was 6.4 and her glucose was 184. She is not bed bound and is ambulating with help and uses a walker and sits on a lift chair for most of the day. Objective Constitutional Pulse regular. Respirations normal and unlabored. Afebrile. Vitals Time Taken: 8:48 AM, Height: 63 in, Weight: 190 lbs, BMI: 33.7, Temperature: 98.1 F, Pulse: 64 bpm, Respiratory Rate: 16 breaths/min, Blood Pressure: 162/50 mmHg. Eyes Wellman, Keturah ([id number]) Nonicteric. Reactive to  light. Ears, Nose, Mouth, and Throat Lips, teeth, and gums WNL.Marland Kitchen Moist mucosa without lesions . Neck supple and nontender. No palpable supraclavicular or cervical adenopathy. Normal sized without goiter. Respiratory WNL. No retractions.. Cardiovascular Pedal Pulses WNL. No clubbing, cyanosis or edema. Lymphatic No adneopathy. No adenopathy. No adenopathy. Musculoskeletal Adexa without tenderness or enlargement.. Digits and nails w/o clubbing, cyanosis, infection, petechiae, ischemia, or inflammatory conditions.Marland Kitchen Psychiatric Judgement and insight Intact.. No evidence of depression, anxiety, or agitation.. General Notes: excellent healing of the pressure wound on the left gluteal region with good epithelialization and surrounding skin. Integumentary (Hair, Skin) No suspicious lesions. No crepitus or fluctuance. No peri-wound warmth or erythema. No masses.. Wound #1 status is Open. Original cause of wound was Pressure Injury. The wound is located on the Left Gluteus. The wound  measures 0.3cm length x 0.5cm width x 0.1cm depth; 0.118cm^2 area and 0.012cm^3 volume. The wound is limited to skin breakdown. There is a none present amount of drainage noted. The wound margin is flat and intact. There is large (67-100%) red granulation within the wound bed. There is no necrotic tissue within the wound bed. The periwound skin appearance exhibited: Dry/Scaly. The periwound skin appearance did not exhibit: Callus, Crepitus, Excoriation, Fluctuance, Friable, Induration, Localized Edema, Rash, Scarring, Maceration, Moist, Atrophie Blanche, Cyanosis, Ecchymosis, Hemosiderin Staining, Mottled, Pallor, Rubor, Erythema. Periwound temperature was noted as No Abnormality. The periwound has tenderness on palpation. Assessment Active Problems ICD-10 E11.622 - Type 2 diabetes mellitus with other skin ulcer Whitesel, Sally ([id number]) T51.761 - Pressure ulcer of left buttock, stage 3 The patient and her daughter have been very compliant with management and there is excellent resolution of her problem. I continue with Prisma and a border foam and all the offloading techniques we have discussed in detail. Her nutrition is also continuing to improve and I will see her back next week. Plan Wound Cleansing: Wound #1 Left Gluteus: Clean wound with Normal Saline. May Shower, gently pat wound dry prior to applying new dressing. Anesthetic: Wound #1 Left Gluteus: Topical Lidocaine 4% cream applied to wound bed prior to debridement Skin Barriers/Peri-Wound Care: Wound #1 Left Gluteus: Skin Prep Primary Wound Dressing: Wound #1 Left Gluteus: Prisma Ag Secondary Dressing: Wound #1 Left Gluteus: Boardered Foam Dressing Dressing Change Frequency: Wound #1 Left Gluteus: Change dressing every other day. Follow-up Appointments: Wound #1 Left Gluteus: Return Appointment in 1 week. Off-Loading: Wound #1 Left Gluteus: Turn and reposition every 2 hours Additional Orders / Instructions: Wound  #1 Left Gluteus: Increase protein intake. JENILYN, MAGANA ([id number]) The patient and her daughter have been very compliant with management and there is excellent resolution of her problem. I continue with Prisma and a border foam and all the offloading techniques we have discussed in detail. Her nutrition is also continuing to improve and I will see her back next week. Electronic Signature(s) Signed: 01/13/2015 9:05:46 AM By: Christin Fudge MD, FACS Entered By: Christin Fudge on 01/13/2015 09:05:46 Victory, [name] ([id number]) -------------------------------------------------------------------------------- SuperBill Details Patient Name: [name] Date of Service: 01/13/2015 Medical Record Number: [id number] Patient Account Number: [id number] Date of Birth/Sex: 04/22/1922 (81 y.o. Female) Treating RN: Primary Care Physician: [name] Other Clinician: Referring Physician: [name] Treating Physician/Extender: Frann Rider in Treatment: 1 Diagnosis Coding ICD-10 Codes Code Description E11.622 Type 2 diabetes mellitus with other skin ulcer L89.323 Pressure ulcer of left buttock, stage 3 Facility Procedures CPT4 Code: [id number] Description: 96789 - WOUND CARE VISIT-LEV 2 EST PT Modifier: Quantity: 1 Physician Procedures CPT4 Code:  [id number] Description: 99213 - WC PHYS LEVEL 3 - EST PT ICD-10 Description Diagnosis E11.622 Type 2 diabetes mellitus with other skin ulcer L89.323 Pressure ulcer of left buttock, stage 3 Modifier: Quantity: 1 Electronic Signature(s) Signed: 01/13/2015 9:05:58 AM By: Christin Fudge MD, FACS Entered By: Christin Fudge on 01/13/2015 09:05:58

## 2015-01-13 NOTE — Progress Notes (Signed)
[name] ([id number]) Visit Report for 01/13/2015 Arrival Information Details Patient Name: [name], [name] Date of Service: 01/13/2015 8:45 AM Medical Record Number: [id number] Patient Account Number: [id number] Date of Birth/Sex: Aug 08, 1921 (81 y.o. Female) Treating RN: Afful, RN, BSN, Velva Harman Primary Care Physician: [name] Other Clinician: Referring Physician: [name] Treating Physician/Extender: Frann Rider in Treatment: 1 Visit Information History Since Last Visit Added or deleted any medications: No Patient Arrived: Wheel Chair Any new allergies or adverse reactions: No Arrival Time: 08:45 Had a fall or experienced change in No activities of daily living that may affect Accompanied By: dtr risk of falls: Transfer Assistance: None Signs or symptoms of abuse/neglect since last No Patient Identification Verified: Yes visito Secondary Verification Process Yes Has Dressing in Place as Prescribed: Yes Completed: Pain Present Now: No Patient Has Alerts: Yes Patient Alerts: DMII Electronic Signature(s) Signed: 01/13/2015 3:44:26 PM By: Regan Lemming BSN, RN Entered By: Regan Lemming on 01/13/2015 09:06:35 [name] ([id number]) -------------------------------------------------------------------------------- Clinic Level of Care Assessment Details Patient Name: [name] Date of Service: 01/13/2015 8:45 AM Medical Record Number: [id number] Patient Account Number: [id number] Date of Birth/Sex: [birth year] (81 y.o. Female) Treating RN: Afful, RN, BSN, Velva Harman Primary Care Physician: [name] Other Clinician: Referring Physician: [name] Treating Physician/Extender: Frann Rider in Treatment: 1 Clinic Level of Care Assessment Items TOOL 4 Quantity Score []  - Use when only an EandM is performed on FOLLOW-UP visit 0 ASSESSMENTS - Nursing Assessment / Reassessment X - Reassessment of Co-morbidities (includes updates in patient status) 1 10 X - Reassessment of  Adherence to Treatment Plan 1 5 ASSESSMENTS - Wound and Skin Assessment / Reassessment X - Simple Wound Assessment / Reassessment - one wound 1 5 []  - Complex Wound Assessment / Reassessment - multiple wounds 0 []  - Dermatologic / Skin Assessment (not related to wound area) 0 ASSESSMENTS - Focused Assessment []  - Circumferential Edema Measurements - multi extremities 0 []  - Nutritional Assessment / Counseling / Intervention 0 []  - Lower Extremity Assessment (monofilament, tuning fork, pulses) 0 []  - Peripheral Arterial Disease Assessment (using hand held doppler) 0 ASSESSMENTS - Ostomy and/or Continence Assessment and Care []  - Incontinence Assessment and Management 0 []  - Ostomy Care Assessment and Management (repouching, etc.) 0 PROCESS - Coordination of Care X - Simple Patient / Family Education for ongoing care 1 15 []  - Complex (extensive) Patient / Family Education for ongoing care 0 []  - Staff obtains Programmer, systems, Records, Test Results / Process Orders 0 []  - Staff telephones HHA, Nursing Homes / Clarify orders / etc 0 []  - Routine Transfer to another Facility (non-emergent condition) 0 [name] ([id number]) []  - Routine Hospital Admission (non-emergent condition) 0 []  - New Admissions / Biomedical engineer / Ordering NPWT, Apligraf, etc. 0 []  - Emergency Hospital Admission (emergent condition) 0 X - Simple Discharge Coordination 1 10 []  - Complex (extensive) Discharge Coordination 0 PROCESS - Special Needs []  - Pediatric / Minor Patient Management 0 []  - Isolation Patient Management 0 []  - Hearing / Language / Visual special needs 0 []  - Assessment of Community assistance (transportation, D/C planning, etc.) 0 []  - Additional assistance / Altered mentation 0 []  - Support Surface(s) Assessment (bed, cushion, seat, etc.) 0 INTERVENTIONS - Wound Cleansing / Measurement X - Simple Wound Cleansing - one wound 1 5 []  - Complex Wound Cleansing - multiple wounds 0 []  - Wound  Imaging (photographs - any number of wounds) 0 []  - Wound Tracing (instead of photographs) 0 []  -  Simple Wound Measurement - one wound 0 []  - Complex Wound Measurement - multiple wounds 0 INTERVENTIONS - Wound Dressings X - Small Wound Dressing one or multiple wounds 1 10 []  - Medium Wound Dressing one or multiple wounds 0 []  - Large Wound Dressing one or multiple wounds 0 []  - Application of Medications - topical 0 []  - Application of Medications - injection 0 INTERVENTIONS - Miscellaneous []  - External ear exam 0 [name] ([id number]) []  - Specimen Collection (cultures, biopsies, blood, body fluids, etc.) 0 []  - Specimen(s) / Culture(s) sent or taken to Lab for analysis 0 []  - Patient Transfer (multiple staff / Harrel Lemon Lift / Similar devices) 0 []  - Simple Staple / Suture removal (25 or less) 0 []  - Complex Staple / Suture removal (26 or more) 0 []  - Hypo / Hyperglycemic Management (close monitor of Blood Glucose) 0 []  - Ankle / Brachial Index (ABI) - do not check if billed separately 0 X - Vital Signs 1 5 Has the patient been seen at the hospital within the last three years: Yes Total Score: 65 Level Of Care: New/Established - Level 2 Electronic Signature(s) Signed: 01/13/2015 3:44:26 PM By: Regan Lemming BSN, RN Entered By: Regan Lemming on 01/13/2015 09:05:34 [name] ([id number]) -------------------------------------------------------------------------------- Lower Extremity Assessment Details Patient Name: [name] Date of Service: 01/13/2015 8:45 AM Medical Record Number: [id number] Patient Account Number: [id number] Date of Birth/Sex: 24-Jul-1921 (81 y.o. Female) Treating RN: Afful, RN, BSN, Velva Harman Primary Care Physician: [name] Other Clinician: Referring Physician: [name] Treating Physician/Extender: Frann Rider in Treatment: 1 Electronic Signature(s) Signed: 01/13/2015 3:44:26 PM By: Regan Lemming BSN, RN Entered By: Regan Lemming on 01/13/2015  08:52:21 [name] ([id number]) -------------------------------------------------------------------------------- Multi Wound Chart Details Patient Name: [name] Date of Service: 01/13/2015 8:45 AM Medical Record Number: [id number] Patient Account Number: [id number] Date of Birth/Sex: 1922/01/13 (81 y.o. Female) Treating RN: Baruch Gouty, RN, BSN, Velva Harman Primary Care Physician: [name] Other Clinician: Referring Physician: [name] Treating Physician/Extender: Frann Rider in Treatment: 1 Vital Signs Height(in): 63 Pulse(bpm): 64 Weight(lbs): 190 Blood Pressure 162/50 (mmHg): Body Mass Index(BMI): 34 Temperature(F): 98.1 Respiratory Rate 16 (breaths/min): Photos: [1:No Photos] [N/A:N/A] Wound Location: [1:Left Gluteus] [N/A:N/A] Wounding Event: [1:Pressure Injury] [N/A:N/A] Primary Etiology: [1:Pressure Ulcer] [N/A:N/A] Comorbid History: [1:Glaucoma, Hypertension, Type II Diabetes, Osteoarthritis] [N/A:N/A] Date Acquired: [1:09/13/2014] [N/A:N/A] Weeks of Treatment: [1:1] [N/A:N/A] Wound Status: [1:Open] [N/A:N/A] Measurements L x W x D 0.3x0.5x0.1 [N/A:N/A] (cm) Area (cm) : [1:0.118] [N/A:N/A] Volume (cm) : [1:0.012] [N/A:N/A] % Reduction in Area: [1:91.70%] [N/A:N/A] % Reduction in Volume: 91.50% [N/A:N/A] Classification: [1:Category/Stage III] [N/A:N/A] Exudate Amount: [1:None Present] [N/A:N/A] Wound Margin: [1:Flat and Intact] [N/A:N/A] Granulation Amount: [1:Large (67-100%)] [N/A:N/A] Granulation Quality: [1:Red] [N/A:N/A] Necrotic Amount: [1:None Present (0%)] [N/A:N/A] Exposed Structures: [1:Fascia: No Fat: No Tendon: No Muscle: No Joint: No Bone: No Limited to Skin Breakdown] [N/A:N/A] Epithelialization: Large (67-100%) N/A N/A Periwound Skin Texture: Edema: No N/A N/A Excoriation: No Induration: No Callus: No Crepitus: No Fluctuance: No Friable: No Rash: No Scarring: No Periwound Skin Dry/Scaly: Yes N/A N/A Moisture: Maceration:  No Moist: No Periwound Skin Color: Atrophie Blanche: No N/A N/A Cyanosis: No Ecchymosis: No Erythema: No Hemosiderin Staining: No Mottled: No Pallor: No Rubor: No Temperature: No Abnormality N/A N/A Tenderness on Yes N/A N/A Palpation: Wound Preparation: Ulcer Cleansing: N/A N/A Rinsed/Irrigated with Saline Topical Anesthetic Applied: None Treatment Notes Electronic Signature(s) Signed: 01/13/2015 3:44:26 PM By: Regan Lemming BSN, RN Entered By: Regan Lemming on 01/13/2015 08:59:04 Kitt, Jeremiah (  [id number]) -------------------------------------------------------------------------------- Multi-Disciplinary Care Plan Details Patient Name: [name], [name] Date of Service: 01/13/2015 8:45 AM Medical Record Number: [id number] Patient Account Number: [id number] Date of Birth/Sex: 1922/04/20 (81 y.o. Female) Treating RN: Afful, RN, BSN, [place] Primary Care Physician: [name] Other Clinician: Referring Physician: [name] Treating Physician/Extender: Frann Rider in Treatment: 1 Active Inactive Abuse / Safety / Falls / Self Care Management Nursing Diagnoses: Potential for falls Goals: Patient will remain injury free Date Initiated: 01/06/2015 Goal Status: Active Interventions: Assess fall risk on admission and as needed Notes: Orientation to the Wound Care Program Nursing Diagnoses: Knowledge deficit related to the wound healing center program Goals: Patient/caregiver will verbalize understanding of the [place] [facility] Date Initiated: 01/06/2015 Goal Status: Active Interventions: Provide education on orientation to the wound center Notes: Pressure Nursing Diagnoses: Knowledge deficit related to management of pressures ulcers Potential for impaired tissue integrity related to pressure, friction, moisture, and shear Goals: Patient will remain free from development of additional pressure ulcers KRISTIANE, MORSCH ([id number]) Date Initiated: 01/06/2015 Goal  Status: Active Interventions: Assess offloading mechanisms upon admission and as needed Assess potential for pressure ulcer upon admission and as needed Provide education on pressure ulcers Notes: Wound/Skin Impairment Nursing Diagnoses: Impaired tissue integrity Goals: Ulcer/skin breakdown will have a volume reduction of 30% by week 4 Date Initiated: 01/06/2015 Goal Status: Active Ulcer/skin breakdown will have a volume reduction of 50% by week 8 Date Initiated: 01/06/2015 Goal Status: Active Ulcer/skin breakdown will have a volume reduction of 80% by week 12 Date Initiated: 01/06/2015 Goal Status: Active Ulcer/skin breakdown will heal within 14 weeks Date Initiated: 01/06/2015 Goal Status: Active Interventions: Assess ulceration(s) every visit Notes: Electronic Signature(s) Signed: 01/13/2015 3:44:26 PM By: Regan Lemming BSN, RN Entered By: Regan Lemming on 01/13/2015 08:58:53 Mehra, [name] ([id number]) -------------------------------------------------------------------------------- Pain Assessment Details Patient Name: [name] Date of Service: 01/13/2015 8:45 AM Medical Record Number: [id number] Patient Account Number: [id number] Date of Birth/Sex: 07/20/1921 (81 y.o. Female) Treating RN: Baruch Gouty, RN, BSN, Velva Harman Primary Care Physician: [name] Other Clinician: Referring Physician: [name] Treating Physician/Extender: Frann Rider in Treatment: 1 Active Problems Location of Pain Severity and Description of Pain Patient Has Paino No Site Locations Pain Management and Medication Current Pain Management: Electronic Signature(s) Signed: 01/13/2015 3:44:26 PM By: Regan Lemming BSN, RN Entered By: Regan Lemming on 01/13/2015 08:51:45 Zaino, [name] ([id number]) -------------------------------------------------------------------------------- Wound Assessment Details Patient Name: [name] Date of Service: 01/13/2015 8:45 AM Medical Record Number: [id number] Patient Account  Number: [id number] Date of Birth/Sex: 18-Nov-1921 (81 y.o. Female) Treating RN: Afful, RN, BSN, [place] Primary Care Physician: [name] Other Clinician: Referring Physician: [name] Treating Physician/Extender: Frann Rider in Treatment: 1 Wound Status Wound Number: 1 Primary Pressure Ulcer Etiology: Wound Location: Left Gluteus Wound Open Wounding Event: Pressure Injury Status: Date Acquired: 09/13/2014 Comorbid Glaucoma, Hypertension, Type II Weeks Of Treatment: 1 History: Diabetes, Osteoarthritis Clustered Wound: No Photos Photo Uploaded By: Regan Lemming on 01/13/2015 15:36:00 Wound Measurements Length: (cm) 0.3 Width: (cm) 0.5 Depth: (cm) 0.1 Area: (cm) 0.118 Volume: (cm) 0.012 % Reduction in Area: 91.7% % Reduction in Volume: 91.5% Epithelialization: Large (67-100%) Wound Description Classification: Category/Stage III Wound Margin: Flat and Intact Exudate Amount: None Present Foul Odor After Cleansing: No Wound Bed Granulation Amount: Large (67-100%) Exposed Structure Granulation Quality: Red Fascia Exposed: No Necrotic Amount: None Present (0%) Fat Layer Exposed: No Tendon Exposed: No Muscle Exposed: No Joint Exposed: No Apsey, Melodye ([id number]) Bone Exposed: No Limited to Skin Breakdown  Periwound Skin Texture Texture Color No Abnormalities Noted: No No Abnormalities Noted: No Callus: No Atrophie Blanche: No Crepitus: No Cyanosis: No Excoriation: No Ecchymosis: No Fluctuance: No Erythema: No Friable: No Hemosiderin Staining: No Induration: No Mottled: No Localized Edema: No Pallor: No Rash: No Rubor: No Scarring: No Temperature / Pain Moisture Temperature: No Abnormality No Abnormalities Noted: No Tenderness on Palpation: Yes Dry / Scaly: Yes Maceration: No Moist: No Wound Preparation Ulcer Cleansing: Rinsed/Irrigated with Saline Topical Anesthetic Applied: None Treatment Notes Wound #1 (Left Gluteus) 1. Cleansed  with: Clean wound with Normal Saline 3. Peri-wound Care: Skin Prep 4. Dressing Applied: Prisma Ag 5. Secondary Dressing Applied Bordered Foam Dressing Electronic Signature(s) Signed: 01/13/2015 3:44:26 PM By: Regan Lemming BSN, RN Entered By: Regan Lemming on 01/13/2015 08:57:51 Basso, Chauna ([id number]) -------------------------------------------------------------------------------- [place] Details Patient Name: [name] Date of Service: 01/13/2015 8:45 AM Medical Record Number: [id number] Patient Account Number: [id number] Date of Birth/Sex: 1922-03-15 (81 y.o. Female) Treating RN: Afful, RN, BSN, [place] Primary Care Physician: [name] Other Clinician: Referring Physician: [name] Treating Physician/Extender: Frann Rider in Treatment: 1 Vital Signs Time Taken: 08:48 Temperature (F): 98.1 Height (in): 63 Pulse (bpm): 64 Weight (lbs): 190 Respiratory Rate (breaths/min): 16 Body Mass Index (BMI): 33.7 Blood Pressure (mmHg): 162/50 Reference Range: 80 - 120 mg / dl Electronic Signature(s) Signed: 01/13/2015 3:44:26 PM By: Regan Lemming BSN, RN Entered By: Regan Lemming on 01/13/2015 08:52:14

## 2015-01-13 NOTE — Telephone Encounter (Signed)
Left msg to call office to reschedule appt/msn

## 2015-01-14 ENCOUNTER — Other Ambulatory Visit: Payer: Self-pay | Admitting: Family Medicine

## 2015-01-14 ENCOUNTER — Encounter: Payer: Self-pay | Admitting: Family Medicine

## 2015-01-14 DIAGNOSIS — C50912 Malignant neoplasm of unspecified site of left female breast: Secondary | ICD-10-CM

## 2015-01-14 MED ORDER — EXEMESTANE 25 MG PO TABS: 25.0000 mg | ORAL_TABLET | Freq: Every day | ORAL | Status: DC

## 2015-01-14 MED ORDER — BRIMONIDINE TARTRATE 0.1 % OP SOLN: 1.0000 [drp] | Freq: Two times a day (BID) | OPHTHALMIC | Status: DC

## 2015-01-17 ENCOUNTER — Encounter: Payer: Self-pay | Admitting: Family Medicine

## 2015-01-20 ENCOUNTER — Encounter: Payer: Medicare PPO | Admitting: Surgery

## 2015-01-20 DIAGNOSIS — L89323 Pressure ulcer of left buttock, stage 3: Secondary | ICD-10-CM | POA: Diagnosis not present

## 2015-01-20 NOTE — Progress Notes (Addendum)
[name], [name] ([id number]) Visit Report for 01/20/2015 Chief Complaint Document Details Patient Name: SALEM, Yang Date of Service: 01/20/2015 9:30 AM Medical Record Number: [id number] Patient Account Number: [id number] Date of Birth/Sex: July 10, 1921 (81 y.o. Female) Treating RN: Primary Care Physician: [name] Other Clinician: Referring Physician: [name] Treating Physician/Extender: Frann Rider in Treatment: 2 Information Obtained from: Patient Chief Complaint Patient presents to the wound care center for a consult due non healing wound. 81 year old patient who comes along with her daughter for a review of a pressure ulcer on the left gluteal region for about 3 months. Electronic Signature(s) Signed: 01/20/2015 10:13:29 AM By: Christin Fudge MD, FACS Entered By: Christin Fudge on 01/20/2015 10:13:29 [name], Yang ([id number]) -------------------------------------------------------------------------------- HPI Details Patient Name: [name] Date of Service: 01/20/2015 9:30 AM Medical Record Number: [id number] Patient Account Number: [id number] Date of Birth/Sex: 06-May-1922 (81 y.o. Female) Treating RN: Primary Care Physician: [name] Other Clinician: Referring Physician: [name] Treating Physician/Extender: Frann Rider in Treatment: 2 History of Present Illness Location: left gluteal region medially Quality: Patient reports No Pain. Severity: Patient states wound are getting worse. Duration: Patient has had the wound for > 3 months prior to seeking treatment at the wound center Timing: Pain in wound is Intermittent (comes and goes Context: The wound appeared gradually over time Modifying Factors: Other treatment(s) tried include:local ointments Associated Signs and Symptoms: Patient reports having difficulty standing for long periods. HPI Description: 81 year old female with a past medical history of type 2 diabetes, hypertension, hyperlipidemia, history  of breast cancer (right) and current cancer of the left breast, osteoarthritis, and glaucoma presents to the wound clinic today a consult of a wound on her sacral region. This has been there for about 3 months now. Her last hemoglobin A1c was 6.4 and her glucose was 184. She is not bed bound and is ambulating with help and uses a walker and sits on a lift chair for most of the day. Electronic Signature(s) Signed: 01/20/2015 10:13:35 AM By: Christin Fudge MD, FACS Entered By: Christin Fudge on 01/20/2015 10:13:35 [name], Yang ([id number]) -------------------------------------------------------------------------------- Physical Exam Details Patient Name: [name] Date of Service: 01/20/2015 9:30 AM Medical Record Number: [id number] Patient Account Number: [id number] Date of Birth/Sex: [birth year] (81 y.o. Female) Treating RN: Primary Care Physician: [name] Other Clinician: Referring Physician: [name] Treating Physician/Extender: Frann Rider in Treatment: 2 Constitutional . Pulse regular. Respirations normal and unlabored. Afebrile. . Eyes Nonicteric. Reactive to light. Ears, Nose, Mouth, and Throat Lips, teeth, and gums WNL.Marland Kitchen Moist mucosa without lesions . Neck supple and nontender. No palpable supraclavicular or cervical adenopathy. Normal sized without goiter. Respiratory WNL. No retractions.. Breath sounds WNL, No rubs, rales, rhonchi, or wheeze.. Cardiovascular Heart rhythm and rate regular, no murmur or gallop.. Pedal Pulses WNL. No clubbing, cyanosis or edema. Chest Breasts symmetical and no nipple discharge.. Breast tissue WNL, no masses, lumps, or tenderness.. Lymphatic No adneopathy. No adenopathy. No adenopathy. Musculoskeletal Adexa without tenderness or enlargement.. Digits and nails w/o clubbing, cyanosis, infection, petechiae, ischemia, or inflammatory conditions.. Integumentary (Hair, Skin) No suspicious lesions. No crepitus or fluctuance. No  peri-wound warmth or erythema. No masses.Marland Kitchen Psychiatric Judgement and insight Intact.. No evidence of depression, anxiety, or agitation.. Notes The wound is looking very good and most of it is generalized. The surrounding skin is supple. Electronic Signature(s) Signed: 01/20/2015 10:14:00 AM By: Christin Fudge MD, FACS Entered By: Christin Fudge on 01/20/2015 10:14:00 [name] ([id number]) -------------------------------------------------------------------------------- Physician Orders Details Patient Name: [name] Date  of Service: 01/20/2015 9:30 AM Medical Record Number: [id number] Patient Account Number: [id number] Date of Birth/Sex: 23-Dec-1921 (81 y.o. Female) Treating RN: Cornell Barman Primary Care Physician: [name] Other Clinician: Referring Physician: [name] Treating Physician/Extender: Frann Rider in Treatment: 2 Verbal / Phone Orders: Yes Clinician: Cornell Barman Read Back and Verified: Yes Diagnosis Coding Wound Cleansing Wound #1 Left Gluteus o Clean wound with Normal Saline. o May Shower, gently pat wound dry prior to applying new dressing. Skin Barriers/Peri-Wound Care Wound #1 Left Gluteus o Skin Prep Primary Wound Dressing Wound #1 Left Gluteus o Prisma Ag Secondary Dressing Wound #1 Left Gluteus o Boardered Foam Dressing Dressing Change Frequency Wound #1 Left Gluteus o Change dressing every other day. Follow-up Appointments Wound #1 Left Gluteus o Return Appointment in 1 week. Off-Loading Wound #1 Left Gluteus o Turn and reposition every 2 hours Additional Orders / Instructions Wound #1 Left Gluteus o Increase protein intake. [name], Yang ([id number]) Electronic Signature(s) Signed: 01/20/2015 10:20:46 AM By: Gretta Cool RN, BSN, Kim RN, BSN Signed: 01/20/2015 1:11:05 PM By: Christin Fudge MD, FACS Entered By: Gretta Cool RN, BSN, Kim on 01/20/2015 10:13:27 [name], Yang  ([id number]) -------------------------------------------------------------------------------- Problem List Details Patient Name: [name] Date of Service: 01/20/2015 9:30 AM Medical Record Number: [id number] Patient Account Number: [id number] Date of Birth/Sex: 12/03/1921 (81 y.o. Female) Treating RN: Primary Care Physician: [name] Other Clinician: Referring Physician: [name] Treating Physician/Extender: Frann Rider in Treatment: 2 Active Problems ICD-10 Encounter Code Description Active Date Diagnosis E11.622 Type 2 diabetes mellitus with other skin ulcer 01/06/2015 Yes L89.323 Pressure ulcer of left buttock, stage 3 01/06/2015 Yes Inactive Problems Resolved Problems Electronic Signature(s) Signed: 01/20/2015 10:13:18 AM By: Christin Fudge MD, FACS Entered By: Christin Fudge on 01/20/2015 10:13:18 Konczal, [name] ([id number]) -------------------------------------------------------------------------------- Progress Note Details Patient Name: [name] Date of Service: 01/20/2015 9:30 AM Medical Record Number: [id number] Patient Account Number: [id number] Date of Birth/Sex: [birth year] (81 y.o. Female) Treating RN: Primary Care Physician: [name] Other Clinician: Referring Physician: [name] Treating Physician/Extender: Frann Rider in Treatment: 2 Subjective Chief Complaint Information obtained from Patient Patient presents to the wound care center for a consult due non healing wound. 82 year old patient who comes along with her daughter for a review of a pressure ulcer on the left gluteal region for about 3 months. History of Present Illness (HPI) The following HPI elements were documented for the patient's wound: Location: left gluteal region medially Quality: Patient reports No Pain. Severity: Patient states wound are getting worse. Duration: Patient has had the wound for > 3 months prior to seeking treatment at the wound center Timing: Pain in  wound is Intermittent (comes and goes Context: The wound appeared gradually over time Modifying Factors: Other treatment(s) tried include:local ointments Associated Signs and Symptoms: Patient reports having difficulty standing for long periods. 81 year old female with a past medical history of type 2 diabetes, hypertension, hyperlipidemia, history of breast cancer (right) and current cancer of the left breast, osteoarthritis, and glaucoma presents to the wound clinic today a consult of a wound on her sacral region. This has been there for about 3 months now. Her last hemoglobin A1c was 6.4 and her glucose was 184. She is not bed bound and is ambulating with help and uses a walker and sits on a lift chair for most of the day. Objective Constitutional Pulse regular. Respirations normal and unlabored. Afebrile. Vitals Time Taken: 9:58 AM, Height: 63 in, Weight: 190 lbs, BMI: 33.7, Temperature: 98.2 F, Pulse: 68  bpm, Respiratory Rate: 18 breaths/min, Blood Pressure: 199/54 mmHg. General Notes: Patient took BP medication this morning. Patient has had difficult morning per daughter. Caregiver and patient should see PCP to address BP. Patient and caregiver understands and agrees. [name], Yang ([id number]) Eyes Nonicteric. Reactive to light. Ears, Nose, Mouth, and Throat Lips, teeth, and gums WNL.Marland Kitchen Moist mucosa without lesions . Neck supple and nontender. No palpable supraclavicular or cervical adenopathy. Normal sized without goiter. Respiratory WNL. No retractions.. Breath sounds WNL, No rubs, rales, rhonchi, or wheeze.. Cardiovascular Heart rhythm and rate regular, no murmur or gallop.. Pedal Pulses WNL. No clubbing, cyanosis or edema. Chest Breasts symmetical and no nipple discharge.. Breast tissue WNL, no masses, lumps, or tenderness.. Lymphatic No adneopathy. No adenopathy. No adenopathy. Musculoskeletal Adexa without tenderness or enlargement.. Digits and nails w/o clubbing,  cyanosis, infection, petechiae, ischemia, or inflammatory conditions.Marland Kitchen Psychiatric Judgement and insight Intact.. No evidence of depression, anxiety, or agitation.. General Notes: The wound is looking very good and most of it is generalized. The surrounding skin is supple. Integumentary (Hair, Skin) No suspicious lesions. No crepitus or fluctuance. No peri-wound warmth or erythema. No masses.. Wound #1 status is Open. Original cause of wound was Pressure Injury. The wound is located on the Left Gluteus. The wound measures 0.2cm length x 0.2cm width x 0cm depth; 0.031cm^2 area and 0.003cm^3 volume. The wound is limited to skin breakdown. There is no tunneling or undermining noted. There is a small amount of serous drainage noted. The wound margin is flat and intact. There is large (67-100%) red granulation within the wound bed. There is no necrotic tissue within the wound bed. The periwound skin appearance exhibited: Dry/Scaly. The periwound skin appearance did not exhibit: Callus, Crepitus, Excoriation, Fluctuance, Friable, Induration, Localized Edema, Rash, Scarring, Maceration, Moist, Atrophie Blanche, Cyanosis, Ecchymosis, Hemosiderin Staining, Mottled, Pallor, Rubor, Erythema. Periwound temperature was noted as No Abnormality. The periwound has tenderness on palpation. [name], Yang ([id number]) Assessment Active Problems ICD-10 E11.622 - Type 2 diabetes mellitus with other skin ulcer L89.323 - Pressure ulcer of left buttock, stage 3 The patient's wound is doing very well with the offloading and the application of Prisma with a foam border. She has not received a foam cushion for her wheelchair yet and we will work with her insurance company. She will come back and see as next week. Plan Wound Cleansing: Wound #1 Left Gluteus: Clean wound with Normal Saline. May Shower, gently pat wound dry prior to applying new dressing. Skin Barriers/Peri-Wound Care: Wound #1 Left Gluteus: Skin  Prep Primary Wound Dressing: Wound #1 Left Gluteus: Prisma Ag Secondary Dressing: Wound #1 Left Gluteus: Boardered Foam Dressing Dressing Change Frequency: Wound #1 Left Gluteus: Change dressing every other day. Follow-up Appointments: Wound #1 Left Gluteus: Return Appointment in 1 week. Off-Loading: Wound #1 Left Gluteus: Turn and reposition every 2 hours Additional Orders / Instructions: Wound #1 Left Gluteus: Increase protein intake. [name], Yang ([id number]) The patient's wound is doing very well with the offloading and the application of Prisma with a foam border. She has not received a foam cushion for her wheelchair yet and we will work with her insurance company. She will come back and see as next week. Electronic Signature(s) Signed: 01/20/2015 10:17:57 AM By: Christin Fudge MD, FACS Entered By: Christin Fudge on 01/20/2015 10:17:57 Bardales, [name] ([id number]) -------------------------------------------------------------------------------- SuperBill Details Patient Name: [name] Date of Service: 01/20/2015 Medical Record Number: [id number] Patient Account Number: [id number] Date of Birth/Sex: 1922/01/17 (81 y.o. Female) Treating RN: Primary Care Physician:  [name] Other Clinician: Referring Physician: [name] Treating Physician/Extender: Frann Rider in Treatment: 2 Diagnosis Coding ICD-10 Codes Code Description E11.622 Type 2 diabetes mellitus with other skin ulcer L89.323 Pressure ulcer of left buttock, stage 3 Facility Procedures CPT4 Code: [id number] Description: [phone] - WOUND CARE VISIT-LEV 2 EST PT Modifier: Quantity: 1 Physician Procedures CPT4 Code: [id number] Description: 79728 - WC PHYS LEVEL 3 - EST PT ICD-10 Description Diagnosis E11.622 Type 2 diabetes mellitus with other skin ulcer L89.323 Pressure ulcer of left buttock, stage 3 Modifier: Quantity: 1 Electronic Signature(s) Signed: 01/20/2015 10:18:11 AM By: Christin Fudge MD,  FACS Entered By: Christin Fudge on 01/20/2015 10:18:11

## 2015-01-20 NOTE — Progress Notes (Signed)
DALLAS, SCORSONE ([id number]) Visit Report for 01/20/2015 Arrival Information Details Patient Name: [name], [name] Date of Service: 01/20/2015 9:30 AM Medical Record Number: [id number] Patient Account Number: [id number] Date of Birth/Sex: 03-25-1922 (81 y.o. Female) Treating RN: Cornell Barman Primary Care Physician: [name] Other Clinician: Referring Physician: [name] Treating Physician/Extender: Frann Rider in Treatment: 2 Visit Information History Since Last Visit Added or deleted any medications: No Patient Arrived: Wheel Chair Any new allergies or adverse reactions: No Arrival Time: 10:01 Had a fall or experienced change in No activities of daily living that may affect Accompanied By: daughter risk of falls: Transfer Assistance: Manual Signs or symptoms of abuse/neglect since last No Patient Identification Verified: Yes visito Secondary Verification Process Yes Hospitalized since last visit: No Completed: Has Dressing in Place as Prescribed: Yes Patient Has Alerts: Yes Pain Present Now: No Patient Alerts: DMII Electronic Signature(s) Signed: 01/20/2015 10:20:46 AM By: Gretta Cool, RN, BSN, Kim RN, BSN Entered By: Gretta Cool, RN, BSN, Kim on 01/20/2015 10:02:23 [name] ([id number]) -------------------------------------------------------------------------------- Clinic Level of Care Assessment Details Patient Name: [name] Date of Service: 01/20/2015 9:30 AM Medical Record Number: [id number] Patient Account Number: [id number] Date of Birth/Sex: 08-19-1921 (81 y.o. Female) Treating RN: Cornell Barman Primary Care Physician: [name] Other Clinician: Referring Physician: [name] Treating Physician/Extender: Frann Rider in Treatment: 2 Clinic Level of Care Assessment Items TOOL 4 Quantity Score []  - Use when only an EandM is performed on FOLLOW-UP visit 0 ASSESSMENTS - Nursing Assessment / Reassessment []  - Reassessment of Co-morbidities (includes updates in  patient status) 0 X - Reassessment of Adherence to Treatment Plan 1 5 ASSESSMENTS - Wound and Skin Assessment / Reassessment X - Simple Wound Assessment / Reassessment - one wound 1 5 []  - Complex Wound Assessment / Reassessment - multiple wounds 0 []  - Dermatologic / Skin Assessment (not related to wound area) 0 ASSESSMENTS - Focused Assessment []  - Circumferential Edema Measurements - multi extremities 0 []  - Nutritional Assessment / Counseling / Intervention 0 []  - Lower Extremity Assessment (monofilament, tuning fork, pulses) 0 []  - Peripheral Arterial Disease Assessment (using hand held doppler) 0 ASSESSMENTS - Ostomy and/or Continence Assessment and Care []  - Incontinence Assessment and Management 0 []  - Ostomy Care Assessment and Management (repouching, etc.) 0 PROCESS - Coordination of Care X - Simple Patient / Family Education for ongoing care 1 15 []  - Complex (extensive) Patient / Family Education for ongoing care 0 []  - Staff obtains Programmer, systems, Records, Test Results / Process Orders 0 []  - Staff telephones HHA, Nursing Homes / Clarify orders / etc 0 []  - Routine Transfer to another Facility (non-emergent condition) 0 Tooker, Elbert ([id number]) []  - Routine Hospital Admission (non-emergent condition) 0 []  - New Admissions / Biomedical engineer / Ordering NPWT, Apligraf, etc. 0 []  - Emergency Hospital Admission (emergent condition) 0 X - Simple Discharge Coordination 1 10 []  - Complex (extensive) Discharge Coordination 0 PROCESS - Special Needs []  - Pediatric / Minor Patient Management 0 []  - Isolation Patient Management 0 []  - Hearing / Language / Visual special needs 0 []  - Assessment of Community assistance (transportation, D/C planning, etc.) 0 []  - Additional assistance / Altered mentation 0 []  - Support Surface(s) Assessment (bed, cushion, seat, etc.) 0 INTERVENTIONS - Wound Cleansing / Measurement X - Simple Wound Cleansing - one wound 1 5 []  - Complex Wound  Cleansing - multiple wounds 0 X - Wound Imaging (photographs - any number of wounds) 1 5 []  - Wound Tracing (  instead of photographs) 0 X - Simple Wound Measurement - one wound 1 5 []  - Complex Wound Measurement - multiple wounds 0 INTERVENTIONS - Wound Dressings X - Small Wound Dressing one or multiple wounds 1 10 []  - Medium Wound Dressing one or multiple wounds 0 []  - Large Wound Dressing one or multiple wounds 0 []  - Application of Medications - topical 0 []  - Application of Medications - injection 0 INTERVENTIONS - Miscellaneous []  - External ear exam 0 Gacek, Duyen ([id number]) []  - Specimen Collection (cultures, biopsies, blood, body fluids, etc.) 0 []  - Specimen(s) / Culture(s) sent or taken to Lab for analysis 0 []  - Patient Transfer (multiple staff / Harrel Lemon Lift / Similar devices) 0 []  - Simple Staple / Suture removal (25 or less) 0 []  - Complex Staple / Suture removal (26 or more) 0 []  - Hypo / Hyperglycemic Management (close monitor of Blood Glucose) 0 []  - Ankle / Brachial Index (ABI) - do not check if billed separately 0 X - Vital Signs 1 5 Has the patient been seen at the hospital within the last three years: Yes Total Score: 65 Level Of Care: New/Established - Level 2 Electronic Signature(s) Signed: 01/20/2015 10:20:46 AM By: Gretta Cool, RN, BSN, Kim RN, BSN Entered By: Gretta Cool, RN, BSN, Kim on 01/20/2015 10:15:54 [name] ([id number]) -------------------------------------------------------------------------------- Encounter Discharge Information Details Patient Name: [name] Date of Service: 01/20/2015 9:30 AM Medical Record Number: [id number] Patient Account Number: [id number] Date of Birth/Sex: Oct 03, 1921 (81 y.o. Female) Treating RN: Cornell Barman Primary Care Physician: [name] Other Clinician: Referring Physician: [name] Treating Physician/Extender: Frann Rider in Treatment: 2 Encounter Discharge Information Items Discharge Pain Level:  0 Discharge Condition: Stable Ambulatory Status: Wheelchair Discharge Destination: Home Transportation: Private Auto Accompanied By: daughter Schedule Follow-up Appointment: Yes Medication Reconciliation completed Yes and provided to Patient/Care Harvest Stanco: Provided on Clinical Summary of Care: 01/20/2015 Form Type Recipient Paper Patient VD Electronic Signature(s) Signed: 01/20/2015 10:20:46 AM By: Ruthine Dose Previous Signature: 01/20/2015 10:20:46 AM Version By: Gretta Cool RN, BSN, Kim RN, BSN Entered By: Ruthine Dose on 01/20/2015 10:20:46 Bovey, [name] ([id number]) -------------------------------------------------------------------------------- Multi Wound Chart Details Patient Name: [name] Date of Service: 01/20/2015 9:30 AM Medical Record Number: [id number] Patient Account Number: [id number] Date of Birth/Sex: December 24, 1921 (81 y.o. Female) Treating RN: Cornell Barman Primary Care Physician: [name] Other Clinician: Referring Physician: [name] Treating Physician/Extender: Frann Rider in Treatment: 2 Vital Signs Height(in): 63 Pulse(bpm): 68 Weight(lbs): 190 Blood Pressure 199/54 (mmHg): Body Mass Index(BMI): 34 Temperature(F): 98.2 Respiratory Rate 18 (breaths/min): Photos: [1:No Photos] [N/A:N/A] Wound Location: [1:Left Gluteus] [N/A:N/A] Wounding Event: [1:Pressure Injury] [N/A:N/A] Primary Etiology: [1:Pressure Ulcer] [N/A:N/A] Comorbid History: [1:Glaucoma, Hypertension, Type II Diabetes, Osteoarthritis] [N/A:N/A] Date Acquired: [1:09/13/2014] [N/A:N/A] Weeks of Treatment: [1:2] [N/A:N/A] Wound Status: [1:Open] [N/A:N/A] Measurements L x W x D 0.2x0.2x0 [N/A:N/A] (cm) Area (cm) : [1:0.031] [N/A:N/A] Volume (cm) : [1:0.003] [N/A:N/A] % Reduction in Area: [1:97.80%] [N/A:N/A] % Reduction in Volume: 97.90% [N/A:N/A] Classification: [1:Category/Stage III] [N/A:N/A] Exudate Amount: [1:Small] [N/A:N/A] Exudate Type: [1:Serous] [N/A:N/A] Exudate  Color: [1:amber] [N/A:N/A] Wound Margin: [1:Flat and Intact] [N/A:N/A] Granulation Amount: [1:Large (67-100%)] [N/A:N/A] Granulation Quality: [1:Red] [N/A:N/A] Necrotic Amount: [1:None Present (0%)] [N/A:N/A] Exposed Structures: [1:Fascia: No Fat: No Tendon: No Muscle: No Joint: No Bone: No] [N/A:N/A] Limited to Skin Breakdown Epithelialization: Large (67-100%) N/A N/A Periwound Skin Texture: Edema: No N/A N/A Excoriation: No Induration: No Callus: No Crepitus: No Fluctuance: No Friable: No Rash: No Scarring: No Periwound Skin Dry/Scaly: Yes N/A N/A  Moisture: Maceration: No Moist: No Periwound Skin Color: Atrophie Blanche: No N/A N/A Cyanosis: No Ecchymosis: No Erythema: No Hemosiderin Staining: No Mottled: No Pallor: No Rubor: No Temperature: No Abnormality N/A N/A Tenderness on Yes N/A N/A Palpation: Wound Preparation: Ulcer Cleansing: N/A N/A Rinsed/Irrigated with Saline Topical Anesthetic Applied: None Treatment Notes Electronic Signature(s) Signed: 01/20/2015 10:20:46 AM By: Gretta Cool, RN, BSN, Kim RN, BSN Entered By: Gretta Cool, RN, BSN, Kim on 01/20/2015 10:08:55 [name] ([id number]) -------------------------------------------------------------------------------- Multi-Disciplinary Care Plan Details Patient Name: [name] Date of Service: 01/20/2015 9:30 AM Medical Record Number: [id number] Patient Account Number: [id number] Date of Birth/Sex: Oct 18, 1921 (81 y.o. Female) Treating RN: Cornell Barman Primary Care Physician: [name] Other Clinician: Referring Physician: [name] Treating Physician/Extender: Frann Rider in Treatment: 2 Active Inactive Abuse / Safety / Falls / Self Care Management Nursing Diagnoses: Potential for falls Goals: Patient will remain injury free Date Initiated: 01/06/2015 Goal Status: Active Interventions: Assess fall risk on admission and as needed Notes: Orientation to the Wound Care Program Nursing  Diagnoses: Knowledge deficit related to the wound healing center program Goals: Patient/caregiver will verbalize understanding of the [facility] Program Date Initiated: 01/06/2015 Goal Status: Active Interventions: Provide education on orientation to the wound center Notes: Pressure Nursing Diagnoses: Knowledge deficit related to management of pressures ulcers Potential for impaired tissue integrity related to pressure, friction, moisture, and shear Goals: Patient will remain free from development of additional pressure ulcers MACKINZIE, VUNCANNON ([id number]) Date Initiated: 01/06/2015 Goal Status: Active Interventions: Assess offloading mechanisms upon admission and as needed Assess potential for pressure ulcer upon admission and as needed Provide education on pressure ulcers Notes: Wound/Skin Impairment Nursing Diagnoses: Impaired tissue integrity Goals: Ulcer/skin breakdown will have a volume reduction of 30% by week 4 Date Initiated: 01/06/2015 Goal Status: Active Ulcer/skin breakdown will have a volume reduction of 50% by week 8 Date Initiated: 01/06/2015 Goal Status: Active Ulcer/skin breakdown will have a volume reduction of 80% by week 12 Date Initiated: 01/06/2015 Goal Status: Active Ulcer/skin breakdown will heal within 14 weeks Date Initiated: 01/06/2015 Goal Status: Active Interventions: Assess ulceration(s) every visit Notes: Electronic Signature(s) Signed: 01/20/2015 10:20:46 AM By: Gretta Cool, RN, BSN, Kim RN, BSN Entered By: Gretta Cool, RN, BSN, Kim on 01/20/2015 10:08:48 [name] ([id number]) -------------------------------------------------------------------------------- Pain Assessment Details Patient Name: [name] Date of Service: 01/20/2015 9:30 AM Medical Record Number: [id number] Patient Account Number: [id number] Date of Birth/Sex: 08/10/1921 (81 y.o. Female) Treating RN: Cornell Barman Primary Care Physician: [name] Other Clinician: Referring  Physician: [name] Treating Physician/Extender: Frann Rider in Treatment: 2 Active Problems Location of Pain Severity and Description of Pain Patient Has Paino No Site Locations Pain Management and Medication Current Pain Management: Electronic Signature(s) Signed: 01/20/2015 10:20:46 AM By: Gretta Cool, RN, BSN, Kim RN, BSN Entered By: Gretta Cool, RN, BSN, Kim on 01/20/2015 10:02:28 [name] ([id number]) -------------------------------------------------------------------------------- Patient/Caregiver Education Details Patient Name: [name] Date of Service: 01/20/2015 9:30 AM Medical Record Number: [id number] Patient Account Number: [id number] Date of Birth/Gender: 1922-03-15 (81 y.o. Female) Treating RN: Cornell Barman Primary Care Physician: [name] Other Clinician: Referring Physician: [name] Treating Physician/Extender: Frann Rider in Treatment: 2 Education Assessment Education Provided To: Patient and Caregiver Education Topics Provided Wound/Skin Impairment: Handouts: Other: continue wound care as prescribed Electronic Signature(s) Signed: 01/20/2015 10:20:46 AM By: Gretta Cool, RN, BSN, Kim RN, BSN Entered By: Gretta Cool, RN, BSN, Kim on 01/20/2015 10:20:12 [name] ([id number]) -------------------------------------------------------------------------------- Wound Assessment Details Patient Name: [name] Date of Service: 01/20/2015 9:30  AM Medical Record Number: [id number] Patient Account Number: [id number] Date of Birth/Sex: [birth year] (81 y.o. Female) Treating RN: Cornell Barman Primary Care Physician: [name] Other Clinician: Referring Physician: [name] Treating Physician/Extender: Frann Rider in Treatment: 2 Wound Status Wound Number: 1 Primary Pressure Ulcer Etiology: Wound Location: Left Gluteus Wound Open Wounding Event: Pressure Injury Status: Date Acquired: 09/13/2014 Comorbid Glaucoma, Hypertension, Type II Weeks Of  Treatment: 2 History: Diabetes, Osteoarthritis Clustered Wound: No Wound Measurements Length: (cm) 0.2 % Redu Width: (cm) 0.2 % Redu Depth: (cm) 0 Epithe Area: (cm) 0.031 Tunne Volume: (cm) 0.003 Under Zero values entered for Length, Width or Depth are replaced with 0.1 to facilitate the calculation of Area and Volume. ction in Area: 97.8% ction in Volume: 97.9% lialization: Large (67-100%) ling: No mining: No Wound Description Classification: Category/Stage III Wound Margin: Flat and Intact Exudate Amount: Small Exudate Type: Serous Exudate Color: amber Foul Odor After Cleansing: No Wound Bed Granulation Amount: Large (67-100%) Exposed Structure Granulation Quality: Red Fascia Exposed: No Necrotic Amount: None Present (0%) Fat Layer Exposed: No Tendon Exposed: No Muscle Exposed: No Joint Exposed: No Bone Exposed: No Limited to Skin Breakdown Periwound Skin Texture Texture Color No Abnormalities Noted: No No Abnormalities Noted: No Callus: No Atrophie Blanche: No AMNA, WELKER ([id number]) Crepitus: No Cyanosis: No Excoriation: No Ecchymosis: No Fluctuance: No Erythema: No Friable: No Hemosiderin Staining: No Induration: No Mottled: No Localized Edema: No Pallor: No Rash: No Rubor: No Scarring: No Temperature / Pain Moisture Temperature: No Abnormality No Abnormalities Noted: No Tenderness on Palpation: Yes Dry / Scaly: Yes Maceration: No Moist: No Wound Preparation Ulcer Cleansing: Rinsed/Irrigated with Saline Topical Anesthetic Applied: None Treatment Notes Wound #1 (Left Gluteus) 1. Cleansed with: Clean wound with Normal Saline 4. Dressing Applied: Prisma Ag 5. Secondary Dressing Applied Bordered Foam Dressing Electronic Signature(s) Signed: 01/20/2015 10:20:46 AM By: Gretta Cool, RN, BSN, Kim RN, BSN Entered By: Gretta Cool, RN, BSN, Kim on 01/20/2015 10:08:29 [name]  ([id number]) -------------------------------------------------------------------------------- Vitals Details Patient Name: [name] Date of Service: 01/20/2015 9:30 AM Medical Record Number: [id number] Patient Account Number: [id number] Date of Birth/Sex: 01-31-1922 (81 y.o. Female) Treating RN: Cornell Barman Primary Care Physician: [name] Other Clinician: Referring Physician: [name] Treating Physician/Extender: Frann Rider in Treatment: 2 Vital Signs Time Taken: 09:58 Temperature (F): 98.2 Height (in): 63 Pulse (bpm): 68 Weight (lbs): 190 Respiratory Rate (breaths/min): 18 Body Mass Index (BMI): 33.7 Blood Pressure (mmHg): 199/54 Reference Range: 80 - 120 mg / dl Notes Patient took BP medication this morning. Patient has had difficult morning per daughter. Caregiver and patient should see PCP to address BP. Patient and caregiver understands and agrees. Electronic Signature(s) Signed: 01/20/2015 10:20:46 AM By: Gretta Cool, RN, BSN, Kim RN, BSN Entered By: Gretta Cool, RN, BSN, Kim on 01/20/2015 10:05:19

## 2015-01-22 LAB — HM DIABETES EYE EXAM

## 2015-01-27 ENCOUNTER — Encounter: Payer: Medicare PPO | Admitting: Surgery

## 2015-01-27 DIAGNOSIS — L89323 Pressure ulcer of left buttock, stage 3: Secondary | ICD-10-CM | POA: Diagnosis not present

## 2015-01-28 ENCOUNTER — Encounter: Payer: Self-pay | Admitting: Family Medicine

## 2015-01-28 NOTE — Progress Notes (Signed)
[name] ([id number]) Visit Report for 01/27/2015 Arrival Information Details Patient Name: [name], [name] 01/27/2015 10:15 Date of Service: AM Medical Record [id number] Number: Patient Account Number: [id number] June 20, 1921 (81 y.o. Treating RN: Montey Hora Date of Birth/Sex: Female) Other Clinician: Primary Care Physician: [name] Treating Britto, Errol Referring Physician: [name] Physician/Extender: [name] in Treatment: 3 Visit Information History Since Last Visit Added or deleted any medications: No Patient Arrived: Wheel Chair Any new allergies or adverse reactions: No Arrival Time: 10:40 Had a fall or experienced change in No activities of daily living that may affect Accompanied By: dtr risk of falls: Transfer Assistance: Manual Signs or symptoms of abuse/neglect since last No Patient Identification Verified: Yes visito Secondary Verification Process Yes Hospitalized since last visit: No Completed: Pain Present Now: No Patient Has Alerts: Yes Patient Alerts: DMII Electronic Signature(s) Signed: 01/27/2015 5:54:31 PM By: Montey Hora Entered By: Montey Hora on 01/27/2015 10:48:33 Labo, Jakelin ([id number]) -------------------------------------------------------------------------------- Clinic Level of Care Assessment Details Patient Name: [name] 01/27/2015 10:15 Date of Service: AM Medical Record [id number] Number: Patient Account Number: [id number] 1922/02/25 (81 y.o. Treating RN: Montey Hora Date of Birth/Sex: Female) Other Clinician: Primary Care Physician: [name] Treating Britto, Errol Referring Physician: [name] Physician/Extender: [name] in Treatment: 3 Clinic Level of Care Assessment Items TOOL 4 Quantity Score []  - Use when only an EandM is performed on FOLLOW-UP visit 0 ASSESSMENTS - Nursing Assessment / Reassessment X - Reassessment of Co-morbidities (includes updates in patient status) 1 10 X - Reassessment of  Adherence to Treatment Plan 1 5 ASSESSMENTS - Wound and Skin Assessment / Reassessment []  - Simple Wound Assessment / Reassessment - one wound 0 []  - Complex Wound Assessment / Reassessment - multiple wounds 0 []  - Dermatologic / Skin Assessment (not related to wound area) 0 ASSESSMENTS - Focused Assessment []  - Circumferential Edema Measurements - multi extremities 0 []  - Nutritional Assessment / Counseling / Intervention 0 []  - Lower Extremity Assessment (monofilament, tuning fork, pulses) 0 []  - Peripheral Arterial Disease Assessment (using hand held doppler) 0 ASSESSMENTS - Ostomy and/or Continence Assessment and Care []  - Incontinence Assessment and Management 0 []  - Ostomy Care Assessment and Management (repouching, etc.) 0 PROCESS - Coordination of Care X - Simple Patient / Family Education for ongoing care 1 15 []  - Complex (extensive) Patient / Family Education for ongoing care 0 []  - Staff obtains Programmer, systems, Records, Test Results / Process Orders 0 []  - Staff telephones HHA, Nursing Homes / Clarify orders / etc 0 Vanmaanen, Neha ([id number]) []  - Routine Transfer to another Facility (non-emergent condition) 0 []  - Routine Hospital Admission (non-emergent condition) 0 []  - New Admissions / Biomedical engineer / Ordering NPWT, Apligraf, etc. 0 []  - Emergency Hospital Admission (emergent condition) 0 X - Simple Discharge Coordination 1 10 []  - Complex (extensive) Discharge Coordination 0 PROCESS - Special Needs []  - Pediatric / Minor Patient Management 0 []  - Isolation Patient Management 0 []  - Hearing / Language / Visual special needs 0 []  - Assessment of Community assistance (transportation, D/C planning, etc.) 0 []  - Additional assistance / Altered mentation 0 []  - Support Surface(s) Assessment (bed, cushion, seat, etc.) 0 INTERVENTIONS - Wound Cleansing / Measurement []  - Simple Wound Cleansing - one wound 0 []  - Complex Wound Cleansing - multiple wounds 0 []  - Wound  Imaging (photographs - any number of wounds) 0 []  - Wound Tracing (instead of photographs) 0 []  - Simple Wound Measurement - one wound 0 []  -  Complex Wound Measurement - multiple wounds 0 INTERVENTIONS - Wound Dressings []  - Small Wound Dressing one or multiple wounds 0 []  - Medium Wound Dressing one or multiple wounds 0 []  - Large Wound Dressing one or multiple wounds 0 []  - Application of Medications - topical 0 []  - Application of Medications - injection 0 Hatchell, Micki ([id number]) INTERVENTIONS - Miscellaneous []  - External ear exam 0 []  - Specimen Collection (cultures, biopsies, blood, body fluids, etc.) 0 []  - Specimen(s) / Culture(s) sent or taken to Lab for analysis 0 []  - Patient Transfer (multiple staff / Harrel Lemon Lift / Similar devices) 0 []  - Simple Staple / Suture removal (25 or less) 0 []  - Complex Staple / Suture removal (26 or more) 0 []  - Hypo / Hyperglycemic Management (close monitor of Blood Glucose) 0 []  - Ankle / Brachial Index (ABI) - do not check if billed separately 0 X - Vital Signs 1 5 Has the patient been seen at the hospital within the last three years: Yes Total Score: 45 Level Of Care: New/Established - Level 2 Electronic Signature(s) Signed: 01/27/2015 5:54:31 PM By: Montey Hora Entered By: Montey Hora on 01/27/2015 11:03:55 Tsao, [name] ([id number]) -------------------------------------------------------------------------------- Encounter Discharge Information Details Patient Name: [name] 01/27/2015 10:15 Date of Service: AM Medical Record [id number] Number: Patient Account Number: [id number] [birth year] (81 y.o. Treating RN: Montey Hora Date of Birth/Sex: Female) Other Clinician: Primary Care Physician: [name] Treating Christin Fudge Referring Physician: [name] Physician/Extender: [name] in Treatment: 3 Encounter Discharge Information Items Discharge Pain Level: 0 Discharge Condition: Stable Ambulatory Status:  Wheelchair Discharge Destination: Home Transportation: Private Auto Accompanied By: dtr Schedule Follow-up Appointment: No Medication Reconciliation completed and provided to Patient/Care No Leshea Jaggers: Provided on Clinical Summary of Care: 01/27/2015 Form Type Recipient Paper Patient VD Electronic Signature(s) Signed: 01/27/2015 11:11:48 AM By: Ruthine Dose Entered By: Ruthine Dose on 01/27/2015 11:11:48 Ostrosky, Tyjai ([id number]) -------------------------------------------------------------------------------- Multi-Disciplinary Care Plan Details Patient Name: BRENNLEY, CURTICE 01/27/2015 10:15 Date of Service: AM Medical Record [id number] Number: Patient Account Number: [id number] [birth year] (81 y.o. Treating RN: Montey Hora Date of Birth/Sex: Female) Other Clinician: Primary Care Physician: [name] Treating Christin Fudge Referring Physician: [name] Physician/Extender: [name] in Treatment: 3 Active Inactive Electronic Signature(s) Signed: 01/27/2015 5:54:31 PM By: Montey Hora Entered By: Montey Hora on 01/27/2015 11:03:16 Sedberry, [name] ([id number]) -------------------------------------------------------------------------------- Patient/Caregiver Education Details Patient Name: CEASIA, ELWELL 01/27/2015 10:15 Date of Service: AM Medical Record [id number] Number: Patient Account Number: [id number] [birth year] (81 y.o. Treating RN: Montey Hora Date of Birth/Gender: Female) Other Clinician: Primary Care Physician: [name] Treating Christin Fudge Referring Physician: [name] Physician/Extender: [name] in Treatment: 3 Education Assessment Education Provided To: Patient and Caregiver Education Topics Provided Pressure: Handouts: Other: continue offloaing and get wheechair cushoin Methods: Demonstration, Explain/Verbal Responses: State content correctly Electronic Signature(s) Signed: 01/27/2015 5:54:31 PM By: Montey Hora Entered By: Montey Hora on  01/27/2015 11:04:44 Poinsett, Haleema ([id number]) -------------------------------------------------------------------------------- Wound Assessment Details Patient Name: NITISHA, CIVELLO 01/27/2015 10:15 Date of Service: AM Medical Record [id number] Number: Patient Account Number: [id number] 28-Jun-1921 (81 y.o. Treating RN: Montey Hora Date of Birth/Sex: Female) Other Clinician: Primary Care Physician: [name] Treating Britto, Errol Referring Physician: [name] Physician/Extender: Weeks in Treatment: 3 Wound Status Wound Number: 1 Primary Etiology: Pressure Ulcer Wound Location: Left Gluteus Wound Status: Healed - Epithelialized Wounding Event: Pressure Injury Date Acquired: 09/13/2014 Weeks Of Treatment: 3 Clustered Wound: No Photos Photo Uploaded By: Gretta Cool, RN, BSN, Kim on 01/27/2015 18:27:31 Wound Measurements Length: (cm) 0 %  Reduction in Width: (cm) 0 % Reduction in Depth: (cm) 0 Area: (cm) 0 Volume: (cm) 0 Area: 100% Volume: 100% Wound Description Classification: Category/Stage III Periwound Skin Texture Texture Color No Abnormalities Noted: No No Abnormalities Noted: No Moisture No Abnormalities Noted: No Electronic Signature(s) LLOYD, CULLINAN ([id number]) Signed: 01/27/2015 5:54:31 PM By: Montey Hora Entered By: Montey Hora on 01/27/2015 11:02:48 Fox, [name] ([id number]) -------------------------------------------------------------------------------- Vitals Details Patient Name: LORE, POLKA 01/27/2015 10:15 Date of Service: AM Medical Record [id number] Number: Patient Account Number: [id number] 25-Jun-1921 (81 y.o. Treating RN: Montey Hora Date of Birth/Sex: Female) Other Clinician: Primary Care Physician: [name] Treating Britto, Errol Referring Physician: [name] Physician/Extender: Weeks in Treatment: 3 Vital Signs Time Taken: 10:43 Temperature (F): 98.3 Height (in): 63 Pulse (bpm): 67 Weight (lbs): 190 Respiratory Rate  (breaths/min): 16 Body Mass Index (BMI): 33.7 Blood Pressure (mmHg): 132/56 Reference Range: 80 - 120 mg / dl Electronic Signature(s) Signed: 01/27/2015 5:54:31 PM By: Montey Hora Entered By: Montey Hora on 01/27/2015 10:48:56

## 2015-01-28 NOTE — Progress Notes (Signed)
[name] ([id number]) Visit Report for 01/27/2015 Chief Complaint Document Details Patient Name: [name], [name] 01/27/2015 10:15 Date of Service: AM Medical Record [id number] Number: Patient Account Number: [id number] 03/19/1922 (81 y.o. Treating RN: Montey Hora Date of Birth/Sex: Female) Other Clinician: Primary Care Physician: [name] Treating [name] Referring Physician: [name] Physician/Extender: [name] in Treatment: 3 Information Obtained from: Patient Chief Complaint Patient presents to the wound care center for a consult due non healing wound. 81 year old patient who comes along with her daughter for a review of a pressure ulcer on the left gluteal region for about 3 months. Electronic Signature(s) Signed: 01/27/2015 11:05:32 AM By: [name] Fudge MD, FACS Entered By: [name] on 01/27/2015 11:05:32 [name] ([id number]) -------------------------------------------------------------------------------- HPI Details Patient Name: [name] 01/27/2015 10:15 Date of Service: AM Medical Record [id number] Number: Patient Account Number: [id number] May 06, 1922 (81 y.o. Treating RN: Montey Hora Date of Birth/Sex: Female) Other Clinician: Primary Care Physician: [name] Treating [name] Referring Physician: [name] Physician/Extender: Weeks in Treatment: 3 History of Present Illness Location: left gluteal region medially Quality: Patient reports No Pain. Severity: Patient states wound are getting worse. Duration: Patient has had the wound for > 3 months prior to seeking treatment at the wound center Timing: Pain in wound is Intermittent (comes and goes Context: The wound appeared gradually over time Modifying Factors: Other treatment(s) tried include:local ointments Associated Signs and Symptoms: Patient reports having difficulty standing for long periods. HPI Description: 81 year old female with a past medical history of type 2  diabetes, hypertension, hyperlipidemia, history of breast cancer (right) and current cancer of the left breast, osteoarthritis, and glaucoma presents to the wound clinic today a consult of a wound on her sacral region. This has been there for about 3 months now. Her last hemoglobin A1c was 6.4 and her glucose was 184. She is not bed bound and is ambulating with help and uses a walker and sits on a lift chair for most of the day. Electronic Signature(s) Signed: 01/27/2015 11:05:38 AM By: [name] Fudge MD, FACS Entered By: [name] on 01/27/2015 11:05:38 [name] ([id number]) -------------------------------------------------------------------------------- Physical Exam Details Patient Name: [name] 01/27/2015 10:15 Date of Service: AM Medical Record [id number] Number: Patient Account Number: [id number] 12-16-1921 (81 y.o. Treating RN: Montey Hora Date of Birth/Sex: Female) Other Clinician: Primary Care Physician: [name] Treating [name] Referring Physician: [name] Physician/Extender: Weeks in Treatment: 3 Constitutional . Pulse regular. Respirations normal and unlabored. Afebrile. . Eyes Nonicteric. Reactive to light. Ears, Nose, Mouth, and Throat Lips, teeth, and gums WNL.Marland Kitchen Moist mucosa without lesions . Neck supple and nontender. No palpable supraclavicular or cervical adenopathy. Normal sized without goiter. Respiratory WNL. No retractions.. Cardiovascular Pedal Pulses WNL. No clubbing, cyanosis or edema. Gastrointestinal (GI) Abdomen without masses or tenderness.. No liver or spleen enlargement or tenderness.. Musculoskeletal Adexa without tenderness or enlargement.. Digits and nails w/o clubbing, cyanosis, infection, petechiae, ischemia, or inflammatory conditions.. Integumentary (Hair, Skin) No suspicious lesions. No crepitus or fluctuance. No peri-wound warmth or erythema. No masses.Marland Kitchen Psychiatric Judgement and insight Intact.. No  evidence of depression, anxiety, or agitation.. Notes After gently washing the area with saline the wound is completely healed and there is no pressure ulceration. Electronic Signature(s) Signed: 01/27/2015 11:06:34 AM By: [name] Fudge MD, FACS Entered By: [name] on 01/27/2015 11:06:33 [name] ([id number]) -------------------------------------------------------------------------------- Physician Orders Details Patient Name: [name] 01/27/2015 10:15 Date of Service: AM Medical Record [id number] Number: Patient Account Number: [id number] 07/21/1921 (81 y.o. Treating RN: Marjory Lies,  Di Kindle Date of Birth/Sex: Female) Other Clinician: Primary Care Physician: [name] Treating [name] Referring Physician: [name] Physician/Extender: [name] in Treatment: 3 Verbal / Phone Orders: Yes Clinician: Montey Hora Read Back and Verified: Yes Diagnosis Coding Discharge From [facility] o Discharge from [place] Signature(s) Signed: 01/27/2015 12:25:57 PM By: [name] Fudge MD, FACS Signed: 01/27/2015 5:54:31 PM By: Montey Hora Entered By: Montey Hora on 01/27/2015 11:03:36 [name] ([id number]) -------------------------------------------------------------------------------- Problem List Details Patient Name: [name] 01/27/2015 10:15 Date of Service: AM Medical Record [id number] Number: Patient Account Number: [id number] 1922-04-25 (81 y.o. Treating RN: Montey Hora Date of Birth/Sex: Female) Other Clinician: Primary Care Physician: [name] Treating [name] Referring Physician: [name] Physician/Extender: Weeks in Treatment: 3 Active Problems ICD-10 Encounter Code Description Active Date Diagnosis E11.622 Type 2 diabetes mellitus with other skin ulcer 01/06/2015 Yes L89.323 Pressure ulcer of left buttock, stage 3 01/06/2015 Yes Inactive Problems Resolved Problems Electronic Signature(s) Signed: 01/27/2015  11:05:25 AM By: [name] Fudge MD, FACS Entered By: [name] on 01/27/2015 11:05:25 [name] ([id number]) -------------------------------------------------------------------------------- Progress Note Details Patient Name: DAILIN, SOSNOWSKI 01/27/2015 10:15 Date of Service: AM Medical Record [id number] Number: Patient Account Number: [id number] 09-26-1921 (81 y.o. Treating RN: Montey Hora Date of Birth/Sex: Female) Other Clinician: Primary Care Physician: [name] Treating [name] Referring Physician: [name] Physician/Extender: [name] in Treatment: 3 Subjective Chief Complaint Information obtained from Patient Patient presents to the wound care center for a consult due non healing wound. 81 year old patient who comes along with her daughter for a review of a pressure ulcer on the left gluteal region for about 3 months. History of Present Illness (HPI) The following HPI elements were documented for the patient's wound: Location: left gluteal region medially Quality: Patient reports No Pain. Severity: Patient states wound are getting worse. Duration: Patient has had the wound for > 3 months prior to seeking treatment at the wound center Timing: Pain in wound is Intermittent (comes and goes Context: The wound appeared gradually over time Modifying Factors: Other treatment(s) tried include:local ointments Associated Signs and Symptoms: Patient reports having difficulty standing for long periods. 81 year old female with a past medical history of type 2 diabetes, hypertension, hyperlipidemia, history of breast cancer (right) and current cancer of the left breast, osteoarthritis, and glaucoma presents to the wound clinic today a consult of a wound on her sacral region. This has been there for about 3 months now. Her last hemoglobin A1c was 6.4 and her glucose was 184. She is not bed bound and is ambulating with help and uses a walker and sits on a lift chair for most of  the day. Objective Constitutional Pulse regular. Respirations normal and unlabored. Afebrile. Vitals Time Taken: 10:43 AM, Height: 63 in, Weight: 190 lbs, BMI: 33.7, Temperature: 98.3 F, Pulse: 67 bpm, Respiratory Rate: 16 breaths/min, Blood Pressure: 132/56 mmHg. AHMIA, COLFORD ([id number]) Eyes Nonicteric. Reactive to light. Ears, Nose, Mouth, and Throat Lips, teeth, and gums WNL.Marland Kitchen Moist mucosa without lesions . Neck supple and nontender. No palpable supraclavicular or cervical adenopathy. Normal sized without goiter. Respiratory WNL. No retractions.. Cardiovascular Pedal Pulses WNL. No clubbing, cyanosis or edema. Gastrointestinal (GI) Abdomen without masses or tenderness.. No liver or spleen enlargement or tenderness.. Musculoskeletal Adexa without tenderness or enlargement.. Digits and nails w/o clubbing, cyanosis, infection, petechiae, ischemia, or inflammatory conditions.Marland Kitchen Psychiatric Judgement and insight Intact.. No evidence of depression, anxiety, or agitation.. General Notes: After gently washing the area with saline the wound is completely healed  and there is no pressure ulceration. Integumentary (Hair, Skin) No suspicious lesions. No crepitus or fluctuance. No peri-wound warmth or erythema. No masses.. Wound #1 status is Healed - Epithelialized. Original cause of wound was Pressure Injury. The wound is located on the Left Gluteus. The wound measures 0cm length x 0cm width x 0cm depth; 0cm^2 area and 0cm^3 volume. Assessment Active Problems ICD-10 E11.622 - Type 2 diabetes mellitus with other skin ulcer L89.323 - Pressure ulcer of left buttock, stage 3 Pete, Yoko ([id number]) The wound is completely healed and after careful review I have recommended we try the foam dressing for protection over this area and we have again gone over the discussion regarding offloading techniques. she has still not received her Roho cushion and we will put in a call again to her  insurance company. We have discussed nutrition and vitamin supplements and we will discharge her from the wound care services and will see her back as needed. Plan Discharge From [facility] Services: Discharge from [place] The wound is completely healed and after careful review I have recommended we try the foam dressing for protection over this area and we have again gone over the discussion regarding offloading techniques. she has still not received her Roho cushion and we will put in a call again to her insurance company. We have discussed nutrition and vitamin supplements and we will discharge her from the wound care services and will see her back as needed. Electronic Signature(s) Signed: 01/27/2015 11:08:43 AM By: [name] Fudge MD, FACS Entered By: [name] on 01/27/2015 11:08:43 [name] ([id number]) -------------------------------------------------------------------------------- SuperBill Details Patient Name: [name] Date of Service: 01/27/2015 Medical Record Number: [id number] Patient Account Number: [id number] Date of Birth/Sex: [birth year] (81 y.o. Female) Treating RN: Montey Hora Primary Care Physician: [name] Other Clinician: Referring Physician: [name] Treating Physician/Extender: Frann Rider in Treatment: 3 Diagnosis Coding ICD-10 Codes Code Description E11.622 Type 2 diabetes mellitus with other skin ulcer L89.323 Pressure ulcer of left buttock, stage 3 Facility Procedures CPT4 Code: [id number] Description: [phone] - WOUND CARE VISIT-LEV 2 EST PT Modifier: Quantity: 1 Physician Procedures CPT4 Code: [id number] Description: [id number] - WC PHYS LEVEL 3 - EST PT ICD-10 Description Diagnosis E11.622 Type 2 diabetes mellitus with other skin ulcer L89.323 Pressure ulcer of left buttock, stage 3 Modifier: Quantity: 1 Electronic Signature(s) Signed: 01/27/2015 11:08:56 AM By: [name] Fudge MD, FACS Entered By: [name] on 01/27/2015  11:08:56

## 2015-01-29 ENCOUNTER — Encounter: Payer: Self-pay | Admitting: Family Medicine

## 2015-01-29 ENCOUNTER — Ambulatory Visit (INDEPENDENT_AMBULATORY_CARE_PROVIDER_SITE_OTHER): Payer: Medicare PPO | Admitting: Family Medicine

## 2015-01-29 VITALS — BP 118/74 | HR 64 | Temp 98.5°F | Ht 63.0 in | Wt 153.0 lb

## 2015-01-29 DIAGNOSIS — M159 Polyosteoarthritis, unspecified: Secondary | ICD-10-CM

## 2015-01-29 DIAGNOSIS — N390 Urinary tract infection, site not specified: Secondary | ICD-10-CM | POA: Diagnosis not present

## 2015-01-29 DIAGNOSIS — Z993 Dependence on wheelchair: Secondary | ICD-10-CM

## 2015-01-29 DIAGNOSIS — M150 Primary generalized (osteo)arthritis: Secondary | ICD-10-CM

## 2015-01-29 LAB — POCT URINALYSIS DIPSTICK
Bilirubin, UA: NEGATIVE
Glucose, UA: 500
Ketones, UA: NEGATIVE
Nitrite, UA: POSITIVE
PROTEIN UA: 100
RBC UA: NEGATIVE
Spec Grav, UA: 1.025
UROBILINOGEN UA: 0.2
pH, UA: 5.5

## 2015-01-29 MED ORDER — METHYLPREDNISOLONE ACETATE 40 MG/ML IJ SUSP: 40.0000 mg | Freq: Once | INTRAMUSCULAR | Status: DC

## 2015-01-29 MED ORDER — CEPHALEXIN 250 MG PO CAPS: 250.0000 mg | ORAL_CAPSULE | Freq: Three times a day (TID) | ORAL | Status: DC

## 2015-01-29 NOTE — Progress Notes (Signed)
Pre visit review using our clinic review tool, if applicable. No additional management support is needed unless otherwise documented below in the visit note. 

## 2015-01-29 NOTE — Patient Instructions (Signed)
It was nice to see you today.  Take the antibiotic as prescribed.  Follow up in October.  Decrease the insulin to 25 units daily.  Take care  Dr. Lacinda Axon

## 2015-01-30 ENCOUNTER — Other Ambulatory Visit: Payer: Self-pay | Admitting: *Deleted

## 2015-01-30 DIAGNOSIS — N390 Urinary tract infection, site not specified: Secondary | ICD-10-CM | POA: Insufficient documentation

## 2015-01-30 MED ORDER — GLUCOSE BLOOD VI STRP: ORAL_STRIP | Status: DC

## 2015-01-30 MED ORDER — TRUEPLUS LANCETS 30G MISC: Status: DC

## 2015-01-30 MED ORDER — METOPROLOL TARTRATE 50 MG PO TABS
50.0000 mg | ORAL_TABLET | Freq: Two times a day (BID) | ORAL | Status: DC
Start: 2015-01-30 — End: 2015-02-20

## 2015-01-30 MED ORDER — HYDRALAZINE HCL 50 MG PO TABS: 50.0000 mg | ORAL_TABLET | Freq: Four times a day (QID) | ORAL | Status: DC

## 2015-01-30 MED ORDER — INSULIN PEN NEEDLE 31G X 5 MM MISC: Status: DC

## 2015-01-30 NOTE — Progress Notes (Addendum)
Subjective:  Patient ID: [name], female    DOB: 02/18/1922  Age: 81 y.o. MRN: [id number]  CC: ? UTI; Also worsening R knee pain   HPI:  81 year old female presents for an acute visit with complaints of possible UTI. Patient also complains of R knee pain.  1) ? UTI  Patient reports dysuria and urinary frequency for the past 3 days.  No exacerbating or relieving factors.  No interventions tried.  No associated fever, chills, nausea, vomiting. No abdominal pain.  2) R knee pain  Patient has a known history of osteoarthritis.  She reports recent worsening of her right knee pain.  No known inciting factor as of recent although she had a fall several months ago.  She's been taking tramadol with no improvement.  Pain is exacerbated by physical activity.  No other associated symptoms.  Patient would like to discuss other therapeutic options at this time.  Social Hx  - Nonsmoker.  Review of Systems  Constitutional: Negative for fever and chills.  Genitourinary: Positive for dysuria and frequency.  Musculoskeletal:       R knee pain.   Objective:  BP 118/74 mmHg  Pulse 64  Temp(Src) 98.5 F (36.9 C) (Oral)  Ht 5\' 3"  (1.6 m)  Wt 153 lb (69.4 kg)  BMI 27.11 kg/m2  SpO2 96%  BP/Weight 03/12/2015 3/32/9518 01/16/1659  Systolic BP 630 160 109  Diastolic BP 62 66 66  Wt. (Lbs) 152 - -  BMI 26.93 - -   Physical Exam  Constitutional:  Chronically ill appearing elderly female; in wheel chair. NAD.   Cardiovascular: Normal rate and regular rhythm.   Pulmonary/Chest: Effort normal and breath sounds normal. No respiratory distress. She has no wheezes. She has no rales.  Abdominal: Soft. She exhibits no distension. There is no tenderness. There is no rebound and no guarding.  Musculoskeletal:  Knee: Right Normal to inspection with no erythema or effusion or obvious bony abnormalities.  Patellar glide with marked crepitus.    Psychiatric: She has a normal mood and  affect.   Urinalysis    Component Value Date/Time   COLORURINE YELLOW 02/18/2015 1440   COLORURINE Yellow 03/01/2012 2104   APPEARANCEUR TURBID* 02/18/2015 1440   APPEARANCEUR Cloudy 03/01/2012 2104   LABSPEC 1.015 02/18/2015 1440   LABSPEC 1.013 03/01/2012 2104   PHURINE 6.0 02/18/2015 1440   PHURINE 5.0 03/01/2012 2104   GLUCOSEU Negative 03/17/2015 1102   GLUCOSEU Negative 03/01/2012 2104   HGBUR TRACE* 02/18/2015 1440   HGBUR Negative 03/01/2012 2104   BILIRUBINUR Negative 03/17/2015 1102   BILIRUBINUR neg 03/06/2015 1200   BILIRUBINUR NEGATIVE 02/18/2015 1440   BILIRUBINUR Negative 03/01/2012 2104   KETONESUR NEGATIVE 02/18/2015 1440   KETONESUR Negative 03/01/2012 2104   PROTEINUR >=300 03/06/2015 1200   PROTEINUR 30* 02/18/2015 1440   PROTEINUR Negative 03/01/2012 2104   UROBILINOGEN 0.2 03/06/2015 1200   UROBILINOGEN 0.2 02/18/2015 1440   NITRITE Negative 03/17/2015 1102   NITRITE neg 03/06/2015 1200   NITRITE NEGATIVE 02/18/2015 1440   NITRITE Negative 03/01/2012 2104   LEUKOCYTESUR 3+* 03/17/2015 1102   LEUKOCYTESUR large (3+)* 03/06/2015 1200   LEUKOCYTESUR Negative 03/01/2012 2104   Procedure: L knee injection.  Consent signed and scanned into record. Medication:  1 cc Depomedrol, 4 cc Lidocaine 1% without epi Preparation: area cleansed with alcohol and betadine Time Out taken  Injection  Landmarks identified Above medication injected using a standard anteromedial approach.  Patient tolerated well without bleeding or  paresthesias   Assessment & Plan:   Problem List Items Addressed This Visit    Osteoarthritis    Steroid injection given in L knee today.       Wheelchair dependent    Patient has severe osteoarthritis of the knee in addition to other comorbidities that make ambulation difficult. Additionally, her gait instability cannot be resolved with the use of a cane/crutches/walker. She is in need of a wheelchair. She has a full time caregiver at  home.       RESOLVED: UTI (urinary tract infection) - Primary    New Problem. Urinalysis consistent with UTI. Obtaining culture and treating with Keflex; Rx given today.      Relevant Orders   POCT Urinalysis Dipstick (Completed)   Urine culture (Completed)      Meds ordered this encounter  Medications  . DISCONTD: cephALEXin (KEFLEX) 250 MG capsule    Sig: Take 1 capsule (250 mg total) by mouth 3 (three) times daily.    Dispense:  21 capsule    Refill:  0  . DISCONTD: methylPREDNISolone acetate (DEPO-MEDROL) injection 40 mg    Sig:     Follow-up: As scheduled (Oct)   [name], DO

## 2015-01-30 NOTE — Assessment & Plan Note (Signed)
New Problem. Urinalysis consistent with UTI. Obtaining culture and treating with Keflex; Rx given today.

## 2015-01-30 NOTE — Assessment & Plan Note (Addendum)
Steroid injection given in L knee today.

## 2015-01-31 LAB — URINE CULTURE: Colony Count: >=100000

## 2015-02-10 ENCOUNTER — Ambulatory Visit (INDEPENDENT_AMBULATORY_CARE_PROVIDER_SITE_OTHER): Payer: Medicare PPO | Admitting: Family Medicine

## 2015-02-10 ENCOUNTER — Encounter: Payer: Self-pay | Admitting: Family Medicine

## 2015-02-10 VITALS — BP 110/70 | HR 65 | Temp 98.4°F | Ht 63.0 in | Wt 153.0 lb

## 2015-02-10 DIAGNOSIS — M25561 Pain in right knee: Secondary | ICD-10-CM | POA: Diagnosis not present

## 2015-02-10 DIAGNOSIS — L299 Pruritus, unspecified: Secondary | ICD-10-CM | POA: Diagnosis not present

## 2015-02-10 HISTORY — DX: Pain in right knee: M25.561

## 2015-02-10 HISTORY — DX: Pruritus, unspecified: L29.9

## 2015-02-10 MED ORDER — OMEPRAZOLE 20 MG PO CPDR: 20.0000 mg | DELAYED_RELEASE_CAPSULE | Freq: Every day | ORAL | Status: DC

## 2015-02-10 MED ORDER — FLUOCINOLONE ACETONIDE 0.01 % EX SHAM: MEDICATED_SHAMPOO | CUTANEOUS | Status: DC

## 2015-02-10 NOTE — Patient Instructions (Signed)
We will be in touch regarding your referral to dermatology.  Use the shampoo as prescribed.  Take care  Dr. Lacinda Axon

## 2015-02-10 NOTE — Progress Notes (Signed)
Subjective:  Patient ID: [name], female    DOB: Aug 01, 1921  Age: 81 y.o. MRN: [id number]  CC: R Knee Pain  HPI 81 year old female with a PMH of OA presents to the clinic today for evaluation of R knee pain. Also concerned about itchy scalp.  1) R knee pain  Patient is accompanied by her daughter today.    Daughter reports that she has had R knee pain since our last visit (8/17).  Pain is severe and makes ambulation difficult.  Patient has a long standing history of OA and has had a partial knee replacement (Medial compartment).  Patient's daughter/caregiver has noticed some associated swelling.  No redness.  Pain is exacerbated by activity. She has been taking tramadol with no improvement.  2) Itchy scalp  Has been going on for quite some time (weeks to months).  No associated redness, irritation, dryness.  No known inciting factor.   No exacerbating or relieving factors.  Social Hx  - Nonsmoker.  Review of Systems  Constitutional: Negative for fever.  Musculoskeletal: Positive for joint swelling and gait problem.  Skin: Negative for color change.   Objective:  BP 110/70 mmHg  Pulse 65  Temp(Src) 98.4 F (36.9 C) (Oral)  Ht 5\' 3"  (1.6 m)  Wt 153 lb (69.4 kg)  BMI 27.11 kg/m2  SpO2 97%  BP/Weight 02/10/2015 01/29/2015 02/14/4096  Systolic BP 353 299 242  Diastolic BP 70 74 83  Wt. (Lbs) 153 153 152  BMI 27.11 27.11 26.93    Physical Exam  Constitutional: She appears well-developed. No distress.  Pulmonary/Chest: Effort normal.  Musculoskeletal:  Knee: Right Inspection - scar noted medially.  Palpation markedly decreased in all planes.  Patellar glide with crepitus.  Neurological: She is alert.  Skin:  Scalp - No lesions, dryness, scale, or erythema noted.  Psychiatric: She has a normal mood and affect.   Lab Results  Component Value Date   WBC 10.7 01/02/2015   HGB 11.2* 01/02/2015   HCT 34.9* 01/02/2015   PLT 200 01/02/2015   GLUCOSE 184*  01/02/2015   CHOL 102 01/02/2015   TRIG 94 01/02/2015   HDL 35* 01/02/2015   LDLCALC 48 01/02/2015   ALT 20 01/02/2015   AST 51* 01/02/2015   NA 144 01/02/2015   K 5.8* 01/02/2015   CL 112* 01/02/2015   CREATININE 1.35* 01/02/2015   BUN 31* 01/02/2015   CO2 23 01/02/2015   HGBA1C 6.4* 01/02/2015   Procedure: R knee injection Consent signed and scanned into record. Medication:  1 cc Depo medrol, 1 cc Lidocaine 1% without epi Preparation: area cleansed with alcohol and betadine Time Out taken  Injection  Landmarks identified Above medication injected using a lateral approach. Patient tolerated well without bleeding or paresthesias.  Assessment & Plan:   Problem List Items Addressed This Visit    Itchy scalp    No abnormalities noted. Treating empirically with topical steroid shampoo (fluocinolone). Rx given. Referring to Derm.      Relevant Orders   Ambulatory referral to Dermatology   Right knee pain - Primary    Patient and caregiver requesting injection. Given history of partial knee replacement I discussed this with 2 colleagues (Dr. Nori Riis and Dr. Micheline Chapman; Sports medicine).  They agreed that injection was appropriate. Injection performed today.         Meds ordered this encounter  Medications  . Fluocinolone Acetonide 0.01 % SHAM    Sig: Apply no more than 1 ounce to  scalp once daily; work into [employer] and allow to remain on scalp for ~5 minutes. Remove from hair and scalp by rinsing thoroughly with water.    Dispense:  120 mL    Refill:  0  . omeprazole (PRILOSEC) 20 MG capsule    Sig: Take 1 capsule (20 mg total) by mouth daily.    Dispense:  90 capsule    Refill:  1   Follow-up: As scheduled in October.   [name], DO

## 2015-02-10 NOTE — Assessment & Plan Note (Signed)
Patient and caregiver requesting injection. Given history of partial knee replacement I discussed this with 2 colleagues (Dr. Nori Riis and Dr. Micheline Chapman; Sports medicine).  They agreed that injection was appropriate. Injection performed today.

## 2015-02-10 NOTE — Assessment & Plan Note (Addendum)
No abnormalities noted. Treating empirically with topical steroid shampoo (fluocinolone). Rx given. Referring to Derm.

## 2015-02-18 ENCOUNTER — Observation Stay (HOSPITAL_COMMUNITY)
Admission: EM | Admit: 2015-02-18 | Discharge: 2015-02-20 | Disposition: A | Discharge status: Home / Self Care | Payer: Medicare PPO | Attending: Internal Medicine | Admitting: Internal Medicine

## 2015-02-18 ENCOUNTER — Encounter: Payer: Self-pay | Admitting: Family Medicine

## 2015-02-18 ENCOUNTER — Observation Stay (HOSPITAL_COMMUNITY): Payer: Medicare PPO

## 2015-02-18 ENCOUNTER — Encounter (HOSPITAL_COMMUNITY): Payer: Self-pay | Admitting: *Deleted

## 2015-02-18 ENCOUNTER — Emergency Department (HOSPITAL_COMMUNITY): Payer: Medicare PPO

## 2015-02-18 DIAGNOSIS — I129 Hypertensive chronic kidney disease with stage 1 through stage 4 chronic kidney disease, or unspecified chronic kidney disease: Secondary | ICD-10-CM | POA: Diagnosis not present

## 2015-02-18 DIAGNOSIS — Z86718 Personal history of other venous thrombosis and embolism: Secondary | ICD-10-CM | POA: Diagnosis not present

## 2015-02-18 DIAGNOSIS — M1990 Unspecified osteoarthritis, unspecified site: Secondary | ICD-10-CM | POA: Diagnosis not present

## 2015-02-18 DIAGNOSIS — R262 Difficulty in walking, not elsewhere classified: Secondary | ICD-10-CM | POA: Diagnosis not present

## 2015-02-18 DIAGNOSIS — Z23 Encounter for immunization: Secondary | ICD-10-CM | POA: Insufficient documentation

## 2015-02-18 DIAGNOSIS — Z794 Long term (current) use of insulin: Secondary | ICD-10-CM | POA: Diagnosis not present

## 2015-02-18 DIAGNOSIS — N183 Chronic kidney disease, stage 3 unspecified: Secondary | ICD-10-CM | POA: Diagnosis present

## 2015-02-18 DIAGNOSIS — Z7982 Long term (current) use of aspirin: Secondary | ICD-10-CM | POA: Diagnosis not present

## 2015-02-18 DIAGNOSIS — R4781 Slurred speech: Secondary | ICD-10-CM | POA: Diagnosis not present

## 2015-02-18 DIAGNOSIS — E1122 Type 2 diabetes mellitus with diabetic chronic kidney disease: Secondary | ICD-10-CM | POA: Diagnosis not present

## 2015-02-18 DIAGNOSIS — G459 Transient cerebral ischemic attack, unspecified: Principal | ICD-10-CM

## 2015-02-18 DIAGNOSIS — R531 Weakness: Secondary | ICD-10-CM | POA: Diagnosis not present

## 2015-02-18 DIAGNOSIS — B370 Candidal stomatitis: Secondary | ICD-10-CM | POA: Diagnosis not present

## 2015-02-18 DIAGNOSIS — I10 Essential (primary) hypertension: Secondary | ICD-10-CM | POA: Diagnosis present

## 2015-02-18 DIAGNOSIS — E785 Hyperlipidemia, unspecified: Secondary | ICD-10-CM | POA: Diagnosis not present

## 2015-02-18 DIAGNOSIS — Z853 Personal history of malignant neoplasm of breast: Secondary | ICD-10-CM | POA: Insufficient documentation

## 2015-02-18 DIAGNOSIS — R29810 Facial weakness: Secondary | ICD-10-CM | POA: Diagnosis not present

## 2015-02-18 DIAGNOSIS — B961 Klebsiella pneumoniae [K. pneumoniae] as the cause of diseases classified elsewhere: Secondary | ICD-10-CM | POA: Insufficient documentation

## 2015-02-18 DIAGNOSIS — E1149 Type 2 diabetes mellitus with other diabetic neurological complication: Secondary | ICD-10-CM

## 2015-02-18 DIAGNOSIS — R410 Disorientation, unspecified: Secondary | ICD-10-CM

## 2015-02-18 DIAGNOSIS — N390 Urinary tract infection, site not specified: Secondary | ICD-10-CM | POA: Insufficient documentation

## 2015-02-18 DIAGNOSIS — E119 Type 2 diabetes mellitus without complications: Secondary | ICD-10-CM | POA: Diagnosis not present

## 2015-02-18 HISTORY — DX: Transient cerebral ischemic attack, unspecified: G45.9

## 2015-02-18 HISTORY — DX: Pressure ulcer of unspecified buttock, unspecified stage: L89.309

## 2015-02-18 HISTORY — DX: Unspecified glaucoma: H40.9

## 2015-02-18 HISTORY — DX: Type 2 diabetes mellitus without complications: E11.9

## 2015-02-18 LAB — COMPREHENSIVE METABOLIC PANEL
ALT: 21 U/L (ref 14–54)
ANION GAP: 5 (ref 5–15)
AST: 22 U/L (ref 15–41)
Albumin: 2.6 g/dL — ABNORMAL LOW (ref 3.5–5.0)
Alkaline Phosphatase: 60 U/L (ref 38–126)
BUN: 21 mg/dL — ABNORMAL HIGH (ref 6–20)
CALCIUM: 9.5 mg/dL (ref 8.9–10.3)
CHLORIDE: 107 mmol/L (ref 101–111)
CO2: 27 mmol/L (ref 22–32)
Creatinine, Ser: 1.05 mg/dL — ABNORMAL HIGH (ref 0.44–1.00)
GFR calc Af Amer: 52 mL/min — ABNORMAL LOW (ref >60)
GFR calc non Af Amer: 45 mL/min — ABNORMAL LOW (ref >60)
Glucose, Bld: 247 mg/dL — ABNORMAL HIGH (ref 65–99)
Potassium: 4.1 mmol/L (ref 3.5–5.1)
SODIUM: 139 mmol/L (ref 135–145)
Total Bilirubin: 0.6 mg/dL (ref 0.3–1.2)
Total Protein: 5.2 g/dL — ABNORMAL LOW (ref 6.5–8.1)

## 2015-02-18 LAB — CBC WITH DIFFERENTIAL/PLATELET
Basophils Absolute: 0 10*3/uL (ref 0.0–0.1)
Basophils Relative: 0 % (ref 0–1)
EOS ABS: 0.2 10*3/uL (ref 0.0–0.7)
EOS PCT: 3 % (ref 0–5)
HCT: 35.2 % — ABNORMAL LOW (ref 36.0–46.0)
Hemoglobin: 11.3 g/dL — ABNORMAL LOW (ref 12.0–15.0)
LYMPHS ABS: 2.8 10*3/uL (ref 0.7–4.0)
Lymphocytes Relative: 35 % (ref 12–46)
MCH: 30.3 pg (ref 26.0–34.0)
MCHC: 32.1 g/dL (ref 30.0–36.0)
MCV: 94.4 fL (ref 78.0–100.0)
MONOS PCT: 6 % (ref 3–12)
Monocytes Absolute: 0.5 10*3/uL (ref 0.1–1.0)
Neutro Abs: 4.4 10*3/uL (ref 1.7–7.7)
Neutrophils Relative %: 55 % (ref 43–77)
PLATELETS: 338 10*3/uL (ref 150–400)
RBC: 3.73 MIL/uL — ABNORMAL LOW (ref 3.87–5.11)
RDW: 13.4 % (ref 11.5–15.5)
WBC: 8 10*3/uL (ref 4.0–10.5)

## 2015-02-18 LAB — GLUCOSE, CAPILLARY: GLUCOSE-CAPILLARY: 211 mg/dL — AB (ref 65–99)

## 2015-02-18 LAB — URINE MICROSCOPIC-ADD ON

## 2015-02-18 LAB — URINALYSIS, ROUTINE W REFLEX MICROSCOPIC
BILIRUBIN URINE: NEGATIVE
Glucose, UA: NEGATIVE mg/dL
Ketones, ur: NEGATIVE mg/dL
Nitrite: NEGATIVE
Protein, ur: 30 mg/dL — AB
SPECIFIC GRAVITY, URINE: 1.015 (ref 1.005–1.030)
UROBILINOGEN UA: 0.2 mg/dL (ref 0.0–1.0)
pH: 6 (ref 5.0–8.0)

## 2015-02-18 MED ORDER — LATANOPROST 0.005 % OP SOLN
1.0000 [drp] | Freq: Every day | OPHTHALMIC | Status: DC
  Administered 2015-02-18 – 2015-02-19 (×2): 1 [drp] via OPHTHALMIC
  Filled 2015-02-18: qty 2.5

## 2015-02-18 MED ORDER — EXEMESTANE 25 MG PO TABS
25.0000 mg | ORAL_TABLET | Freq: Every day | ORAL | Status: DC
  Administered 2015-02-18 – 2015-02-19 (×2): 25 mg via ORAL
  Filled 2015-02-18 (×3): qty 1

## 2015-02-18 MED ORDER — METOPROLOL SUCCINATE ER 25 MG PO TB24
50.0000 mg | ORAL_TABLET | Freq: Two times a day (BID) | ORAL | Status: DC
  Administered 2015-02-18 – 2015-02-20 (×4): 50 mg via ORAL
  Filled 2015-02-18 (×4): qty 2

## 2015-02-18 MED ORDER — DEXTROSE 5 % IV SOLN
1.0000 g | Freq: Once | INTRAVENOUS | Status: DC
  Filled 2015-02-18: qty 10

## 2015-02-18 MED ORDER — INSULIN ASPART PROT & ASPART (70-30 MIX) 100 UNIT/ML ~~LOC~~ SUSP
15.0000 [IU] | Freq: Every day | SUBCUTANEOUS | Status: DC
  Administered 2015-02-19: 15 [IU] via SUBCUTANEOUS
  Filled 2015-02-18 (×5): qty 10

## 2015-02-18 MED ORDER — ASPIRIN 300 MG RE SUPP: 300.0000 mg | Freq: Every day | RECTAL | Status: DC

## 2015-02-18 MED ORDER — NYSTATIN 100000 UNIT/ML MT SUSP
5.0000 mL | Freq: Four times a day (QID) | OROMUCOSAL | Status: DC
  Administered 2015-02-18 – 2015-02-20 (×6): 500000 [IU] via ORAL
  Filled 2015-02-18 (×9): qty 5

## 2015-02-18 MED ORDER — ATORVASTATIN CALCIUM 40 MG PO TABS
40.0000 mg | ORAL_TABLET | Freq: Every day | ORAL | Status: DC
  Administered 2015-02-19: 40 mg via ORAL
  Filled 2015-02-18: qty 1

## 2015-02-18 MED ORDER — LISINOPRIL 20 MG PO TABS
20.0000 mg | ORAL_TABLET | Freq: Two times a day (BID) | ORAL | Status: DC
  Administered 2015-02-18 – 2015-02-20 (×4): 20 mg via ORAL
  Filled 2015-02-18 (×4): qty 1

## 2015-02-18 MED ORDER — INSULIN ASPART PROT & ASPART (70-30 MIX) 100 UNIT/ML ~~LOC~~ SUSP
50.0000 [IU] | Freq: Every day | SUBCUTANEOUS | Status: DC
  Administered 2015-02-19 – 2015-02-20 (×2): 50 [IU] via SUBCUTANEOUS
  Filled 2015-02-18: qty 10

## 2015-02-18 MED ORDER — PANTOPRAZOLE SODIUM 40 MG PO TBEC
40.0000 mg | DELAYED_RELEASE_TABLET | Freq: Every day | ORAL | Status: DC
  Administered 2015-02-19 – 2015-02-20 (×2): 40 mg via ORAL
  Filled 2015-02-18 (×2): qty 1

## 2015-02-18 MED ORDER — DEXTROSE 5 % IV SOLN
1.0000 g | INTRAVENOUS | Status: DC
  Administered 2015-02-18 – 2015-02-19 (×2): 1 g via INTRAVENOUS
  Filled 2015-02-18 (×3): qty 10

## 2015-02-18 MED ORDER — ENOXAPARIN SODIUM 40 MG/0.4ML ~~LOC~~ SOLN
40.0000 mg | SUBCUTANEOUS | Status: DC
  Administered 2015-02-18 – 2015-02-19 (×2): 40 mg via SUBCUTANEOUS
  Filled 2015-02-18 (×2): qty 0.4

## 2015-02-18 MED ORDER — STROKE: EARLY STAGES OF RECOVERY BOOK
Freq: Once | Status: AC
  Administered 2015-02-18: 21:00:00

## 2015-02-18 MED ORDER — INSULIN ASPART 100 UNIT/ML ~~LOC~~ SOLN
0.0000 [IU] | SUBCUTANEOUS | Status: DC
  Administered 2015-02-18: 5 [IU] via SUBCUTANEOUS

## 2015-02-18 MED ORDER — ASPIRIN 325 MG PO TABS
325.0000 mg | ORAL_TABLET | Freq: Every day | ORAL | Status: DC
Start: 2015-02-18 — End: 2015-02-19
  Administered 2015-02-18 – 2015-02-19 (×2): 325 mg via ORAL
  Filled 2015-02-18 (×2): qty 1

## 2015-02-18 MED ORDER — BRIMONIDINE TARTRATE 0.2 % OP SOLN
1.0000 [drp] | Freq: Two times a day (BID) | OPHTHALMIC | Status: DC
  Administered 2015-02-18 – 2015-02-20 (×4): 1 [drp] via OPHTHALMIC
  Filled 2015-02-18: qty 5

## 2015-02-18 MED ORDER — INSULIN ASPART 100 UNIT/ML ~~LOC~~ SOLN
0.0000 [IU] | Freq: Three times a day (TID) | SUBCUTANEOUS | Status: DC
  Administered 2015-02-19 – 2015-02-20 (×3): 3 [IU] via SUBCUTANEOUS

## 2015-02-18 MED ORDER — BRIMONIDINE TARTRATE 0.15 % OP SOLN
1.0000 [drp] | Freq: Two times a day (BID) | OPHTHALMIC | Status: DC
  Filled 2015-02-18: qty 5

## 2015-02-18 MED ORDER — PNEUMOCOCCAL VAC POLYVALENT 25 MCG/0.5ML IJ INJ
0.5000 mL | INJECTION | INTRAMUSCULAR | Status: DC
  Filled 2015-02-18: qty 0.5

## 2015-02-18 MED ORDER — CLONAZEPAM 0.5 MG PO TABS
0.5000 mg | ORAL_TABLET | Freq: Every day | ORAL | Status: DC
  Administered 2015-02-18 – 2015-02-19 (×2): 0.5 mg via ORAL
  Filled 2015-02-18 (×2): qty 1

## 2015-02-18 MED ORDER — HYDRALAZINE HCL 50 MG PO TABS
50.0000 mg | ORAL_TABLET | Freq: Four times a day (QID) | ORAL | Status: DC
  Administered 2015-02-18 – 2015-02-20 (×7): 50 mg via ORAL
  Filled 2015-02-18 (×7): qty 1

## 2015-02-18 NOTE — ED Notes (Signed)
Transport tech to take pt to MRI. Tech states they will take pt up to room on 5MW.

## 2015-02-18 NOTE — ED Notes (Signed)
Patient transported to CT 

## 2015-02-18 NOTE — ED Notes (Signed)
Phlebotomy at bedside with patient.   

## 2015-02-18 NOTE — Consult Note (Signed)
Referring Physician: Pollina    Chief Complaint: transient right facial droop  HPI:                                                                                                                                         [name] is an 81 y.o. female who one month ago was noted to have a transient right facial droop which resolved in 10 minutes.  She was not taken to a MD at that point. Last night she went to bed at 22:00 and daughter (who is her primary care taker) and was noted to be normal. This AM at 0930 she was again noted to have a 10 minute transient right facial droop with drooling.  The symptoms have fully resolved. Patient is wheel chair bound, full ADL's and per daughter likely some mild dementia. Neurology was called for TIA.  Date last known well: Date: 02/17/2015 Time last known well: Time: 22:00 tPA Given: No: out of window and symptoms resolved.      Past Medical History  Diagnosis Date  . Arthritis   . Breast cancer     Hx of R breast cancer s/p mastectomy; Currently has L breast cancer (Diagnosed 2015).   . Diabetes mellitus without complication     Insulin dependent.   . Hyperlipidemia   . Hypertension   . History of DVT (deep vein thrombosis)   . Glaucoma     Past Surgical History  Procedure Laterality Date  . Breast surgery      R mastectomy   . Cholecystectomy    . Medial partial knee replacement Right     Family History  Problem Relation Age of Onset  . Diabetes Son   . Diabetes Son   . Heart disease Mother    Social History:  reports that she has never smoked. She has never used smokeless tobacco. She reports that she does not drink alcohol or use illicit drugs.  Allergies: No Known Allergies  Medications:                                                                                                                           Current Facility-Administered Medications  Medication Dose Route Frequency Provider Last Rate Last Dose  .  methylPREDNISolone acetate (DEPO-MEDROL) injection 40 mg  40 mg Intra-articular Once Coral Spikes, DO  Current Outpatient Prescriptions  Medication Sig Dispense Refill  . aspirin 81 MG tablet Take 81 mg by mouth daily.    Marland Kitchen atorvastatin (LIPITOR) 40 MG tablet Take 40 mg by mouth daily.    . bimatoprost (LUMIGAN) 0.01 % SOLN Place 1 drop into both eyes at bedtime.    . brimonidine (ALPHAGAN P) 0.1 % SOLN Place 1 drop into both eyes 2 (two) times daily. 10 mL 0  . Calcium Carb-Cholecalciferol (CALCIUM 600 + D PO) Take by mouth.    . cephALEXin (KEFLEX) 250 MG capsule Take 1 capsule (250 mg total) by mouth 3 (three) times daily. 21 capsule 0  . Cholecalciferol (VITAMIN D3) 1000 UNITS CAPS Take by mouth.    . clonazePAM (KLONOPIN) 0.5 MG tablet Take 0.5 mg by mouth daily.    Marland Kitchen CRANBERRY PO Take by mouth.    Marland Kitchen exemestane (AROMASIN) 25 MG tablet Take 1 tablet (25 mg total) by mouth daily after breakfast. 30 tablet 6  . Fluocinolone Acetonide 0.01 % SHAM Apply no more than 1 ounce to scalp once daily; work into [employer] and allow to remain on scalp for ~5 minutes. Remove from hair and scalp by rinsing thoroughly with water. 120 mL 0  . glucose blood test strip Use to test blood sugar 3 times daily.  Dx Code:11.9 100 each 12  . hydrALAZINE (APRESOLINE) 50 MG tablet Take 1 tablet (50 mg total) by mouth 4 (four) times daily. 30 tablet 6  . insulin aspart protamine - aspart (NOVOLOG MIX 70/30 FLEXPEN) (70-30) 100 UNIT/ML FlexPen Inject 50 units in the morning and 15 units in the evening 15 mL 11  . insulin aspart protamine - aspart (NOVOLOG MIX 70/30 FLEXPEN) (70-30) 100 UNIT/ML FlexPen Inject 0.35 mLs (35 Units total) into the skin 2 (two) times daily. 15 mL 11  . Insulin Pen Needle (SURE-FINE PEN NEEDLES) 31G X 5 MM MISC Use to test blood sugar 3 times daily  Dx code E11.9 100 each 6  . lisinopril (PRINIVIL,ZESTRIL) 20 MG tablet Take 20 mg by mouth 2 (two) times daily.    . metoprolol (LOPRESSOR) 50  MG tablet Take 1 tablet (50 mg total) by mouth 2 (two) times daily. 60 tablet 6  . Multiple Vitamins-Minerals (MULTIVITAMIN GUMMIES ADULT PO) Take by mouth.    Marland Kitchen omeprazole (PRILOSEC) 20 MG capsule Take 1 capsule (20 mg total) by mouth daily. 90 capsule 1  . TRUEPLUS LANCETS 30G MISC Use to test blood sugar 3 times a day  Dx code E11.9 100 each 6     ROS:                                                                                                                                       History obtained from the patient  General ROS: negative for - chills, fatigue, fever, night sweats, weight gain or weight loss Psychological ROS: negative  for - behavioral disorder, hallucinations, memory difficulties, mood swings or suicidal ideation Ophthalmic ROS: negative for - blurry vision, double vision, eye pain or loss of vision ENT ROS: negative for - epistaxis, nasal discharge, oral lesions, sore throat, tinnitus or vertigo Allergy and Immunology ROS: negative for - hives or itchy/watery eyes Hematological and Lymphatic ROS: negative for - bleeding problems, bruising or swollen lymph nodes Endocrine ROS: negative for - galactorrhea, hair pattern changes, polydipsia/polyuria or temperature intolerance Respiratory ROS: negative for - cough, hemoptysis, shortness of breath or wheezing Cardiovascular ROS: negative for - chest pain, dyspnea on exertion, edema or irregular heartbeat Gastrointestinal ROS: negative for - abdominal pain, diarrhea, hematemesis, nausea/vomiting or stool incontinence Genito-Urinary ROS: negative for - dysuria, hematuria, incontinence or urinary frequency/urgency Musculoskeletal ROS: negative for - joint swelling or muscular weakness Neurological ROS: as noted in HPI Dermatological ROS: negative for rash and skin lesion changes  Neurologic Examination:                                                                                                      Blood pressure 173/49,  pulse 66, temperature 98.7 F (37.1 C), temperature source Oral, resp. rate 15, SpO2 99 %.  HEENT-  Normocephalic, no lesions, without obvious abnormality.  Normal external eye and conjunctiva.  Normal TM's bilaterally.  Normal auditory canals and external ears. Normal external nose, mucus membranes and septum.  Normal pharynx. Cardiovascular- S1, S2 normal, pulses palpable throughout   Lungs- chest clear, no wheezing, rales, normal symmetric air entry Abdomen- normal findings: bowel sounds normal Extremities- no edema Lymph-no adenopathy palpable Musculoskeletal-no joint tenderness, deformity or swelling Skin-warm and dry, no hyperpigmentation, vitiligo, or suspicious lesions  Neurological Examination Mental Status: Alert, oriented, thought content appropriate.  Speech fluent without evidence of aphasia.  Able to follow 3 step commands without difficulty. Cranial Nerves: II: Discs flat bilaterally; Visual fields grossly normal, pupils equal, round, reactive to light and accommodation III,IV, VI: ptosis not present, extra-ocular motions intact bilaterally V,VII: smile symmetric with left NL fold decrease at rest, facial light touch sensation normal bilaterally VIII: hearing normal bilaterally IX,X: uvula rises symmetrically XI: bilateral shoulder shrug XII: midline tongue extension Motor: Right : Upper extremity   4/5    Left:     Upper extremity   4/5  Lower extremity   3/5     Lower extremity   3/5 Tone and bulk:normal tone throughout; no atrophy noted Sensory: Pinprick and light touch intact throughout, bilaterally Deep Tendon Reflexes: 2+ and symmetric throughout Plantars: Right: downgoing   Left: downgoing Cerebellar: Unable to do finger-to-nose due to weakness,  Gait: wheel chair bound       Lab Results: Basic Metabolic Panel:  Recent Labs Lab 02/18/15 1117  NA 139  K 4.1  CL 107  CO2 27  GLUCOSE 247*  BUN 21*  CREATININE 1.05*  CALCIUM 9.5    Liver  Function Tests:  Recent Labs Lab 02/18/15 1117  AST 22  ALT 21  ALKPHOS 60  BILITOT 0.6  PROT 5.2*  ALBUMIN 2.6*   No  results for input(s): LIPASE, AMYLASE in the last 168 hours. No results for input(s): AMMONIA in the last 168 hours.  CBC:  Recent Labs Lab 02/18/15 1117  WBC 8.0  NEUTROABS 4.4  HGB 11.3*  HCT 35.2*  MCV 94.4  PLT 338    Cardiac Enzymes: No results for input(s): CKTOTAL, CKMB, CKMBINDEX, TROPONINI in the last 168 hours.  Lipid Panel: No results for input(s): CHOL, TRIG, HDL, CHOLHDL, VLDL, LDLCALC in the last 168 hours.  CBG: No results for input(s): GLUCAP in the last 168 hours.  Microbiology: Results for orders placed or performed in visit on 01/29/15  Urine culture     Status: None   Collection Time: 01/29/15  4:49 PM  Result Value Ref Range Status   Culture KLEBSIELLA PNEUMONIAE  Final   Colony Count >=100,000 COLONIES/ML  Final   Organism ID, Bacteria KLEBSIELLA PNEUMONIAE  Final      Susceptibility   Klebsiella pneumoniae -  (no method available)    AMPICILLIN  Resistant     AMOX/CLAVULANIC 4 Sensitive     AMPICILLIN/SULBACTAM 4 Sensitive     PIP/TAZO <=4 Sensitive     IMIPENEM <=0.25 Sensitive     CEFTRIAXONE <=1 Sensitive     CEFTAZIDIME <=1 Sensitive     CEFEPIME <=1 Sensitive     GENTAMICIN <=1 Sensitive     TOBRAMYCIN <=1 Sensitive     CIPROFLOXACIN <=0.25 Sensitive     LEVOFLOXACIN <=0.12 Sensitive     NITROFURANTOIN <=16 Sensitive     TRIMETH/SULFA* <=20 Sensitive      * ORAL therapy:A cefazolin MIC of <32 predicts susceptibility to the oral agents cefaclor,cefdinir,cefpodoxime,cefprozil,cefuroxime,cephalexin,and loracarbef when used for therapy of uncomplicated UTIs due to E.coli,K.pneumomiae,and P.mirabilis. PARENTERAL therapy: A cefazolinMIC of >8 indicates resistance to parenteralcefazolin. An alternate test method must beperformed to confirm susceptibility to parenteralcefazolin.    Coagulation Studies: No results for  input(s): LABPROT, INR in the last 72 hours.  Imaging: Ct Head Wo Contrast  02/18/2015   CLINICAL DATA:  Right facial droop with slurred speech and sudden onset confusion  EXAM: CT HEAD WITHOUT CONTRAST  TECHNIQUE: Contiguous axial images were obtained from the base of the skull through the vertex without intravenous contrast.  COMPARISON:  None.  FINDINGS: There is moderate diffuse atrophy. There is no intracranial mass, hemorrhage, extra-axial fluid collection, or midline shift. There is patchy small vessel disease in the centra semiovale bilaterally. There is an age uncertain small infarct in the periphery of the midportion left frontal lobe superiorly. Gray-white compartments elsewhere appear normal. Bony calvarium appears intact. Mastoid air cells are clear.  IMPRESSION: Age uncertain small infarct superior midportion left frontal lobe. There is atrophy with periventricular small vessel disease. No hemorrhage or mass effect.   Electronically Signed   By: Lowella Grip III M.D.   On: 02/18/2015 13:38       Assessment and plan discussed with with attending physician and they are in agreement.    Etta Quill PA-C [facility] [phone]  02/18/2015, 2:54 PM   Assessment: 81 y.o. female with two transient episodes of right facial droop and drooling last ing for 10 minutes. Concerning for TIA. Symptoms have fully resolved at this time.   Stroke Risk Factors - diabetes mellitus, hyperlipidemia and hypertension  Recommend: 1. HgbA1c, fasting lipid panel 2. MRI, MRA  of the brain without contrast 3. PT consult, OT consult, Speech consult 4. Echocardiogram 5. Carotid dopplers 6. Prophylactic therapy-Antiplatelet med: Aspirin - dose 325 mg daily  7. Risk factor modification 8. Telemetry monitoring 9. Frequent neuro checks 10 NPO until passes stroke swallow screen

## 2015-02-18 NOTE — ED Provider Notes (Signed)
Patient presented to the ER with right facial droop and confusion. Symptoms began sometime after 9 AM. Daughter reports that she noticed that the patient's right face was drooping and she was drooling, had acute onset of slurred speech. Patient was mildly confused as well. The symptoms have resolved prior to arrival in the ER. Symptoms lasted approximately 10 minutes. Daughter reports that this happened one other time recently, has not seen her doctor for this.  Face to face Exam: HEENT - PERRLA Lungs - CTAB Heart - RRR, no M/R/G Abd - S/NT/ND Neuro - alert, oriented x3; upper extremity strength symmetric, lower extremity strength diminished but symmetric; +/-right facial weakness compared to left  Plan: Patient with resolved symptoms at arrival to the ER. Not a candidate for TPA or coach stroke. TIA/CVA workup.  Orpah Greek, MD 02/18/15 1150

## 2015-02-18 NOTE — ED Notes (Signed)
Pt arrives from home via [facility]. Pt woke up at 0915 and daughter(who is primary caretaker) noticed right sided facial droop and the patient was altered. Upon EMS arrival at 1000 the symptoms had resolved. The daughter reports a similar episode on 02/05/15.

## 2015-02-18 NOTE — Progress Notes (Signed)
Pt transferred to unit via ED. Pt alert and oriented upon arrival. No complaints of pain or discomfort. No signs or symptoms of acute distress. Pt connected to telemetry and central monitoring notified. Pt oriented to unit as well as unit procedures. Pt now resting in bed at lowest position, bed alarm on, call light in reach. Will continue to monitor. Fortino Sic, RN, BSN 02/19/2015 6:57 AM

## 2015-02-18 NOTE — ED Provider Notes (Signed)
CSN: [id number]     Arrival date & time 02/18/15  1053 History   First MD Initiated Contact with Patient 02/18/15 1054     Chief Complaint  Patient presents with  . Transient Ischemic Attack     (Consider location/radiation/quality/duration/timing/severity/associated sxs/prior Treatment) HPI [name] is a 81 y.o. female with a history of breast cancer, currently in left breast with cancer meds, hypertension, hyperlipidemia, diabetes, DVT, glaucoma, comes in for evaluation of facial droop. Daughter is at bedside who contributes history of present illness. Daughter states this morning at approximately 9:30, she saw patient and noticed that the left side of her mouth "was twisted a little bit and she was drooling". She also reports patient was confused about where she was. She reports this episode lasted for approximately 5 minutes and at that point patient was oriented to place and time, but the facial droop remained. Daughter reports last time she saw patient normal was last night at 10:30 PM. Patient denies any headache, vision changes, chest pain, shortness of breath, numbness or weakness, nausea or vomiting, urinary symptoms, dark or bloody stools or any other medical complaints at this time. Patient takes aspirin.  Past Medical History  Diagnosis Date  . Arthritis   . Breast cancer     Hx of R breast cancer s/p mastectomy; Currently has L breast cancer (Diagnosed 2015).   . Diabetes mellitus without complication     Insulin dependent.   . Hyperlipidemia   . Hypertension   . History of DVT (deep vein thrombosis)   . Glaucoma    Past Surgical History  Procedure Laterality Date  . Breast surgery      R mastectomy   . Cholecystectomy    . Medial partial knee replacement Right    Family History  Problem Relation Age of Onset  . Diabetes Son   . Diabetes Son   . Heart disease Mother    Social History  Substance Use Topics  . Smoking status: Never Smoker   . Smokeless tobacco:  Never Used  . Alcohol Use: No   OB History    No data available     Review of Systems A 10 point review of systems was completed and was negative except for pertinent positives and negatives as mentioned in the history of present illness     Allergies  Review of patient's allergies indicates no known allergies.  Home Medications   Prior to Admission medications   Medication Sig Start Date End Date Taking? Authorizing Provider  aspirin 81 MG tablet Take 81 mg by mouth daily.    Historical Provider, MD  atorvastatin (LIPITOR) 40 MG tablet Take 40 mg by mouth daily.    Historical Provider, MD  bimatoprost (LUMIGAN) 0.01 % SOLN Place 1 drop into both eyes at bedtime.    Historical Provider, MD  brimonidine (ALPHAGAN P) 0.1 % SOLN Place 1 drop into both eyes 2 (two) times daily. 01/14/15   Coral Spikes, DO  Calcium Carb-Cholecalciferol (CALCIUM 600 + D PO) Take by mouth.    Historical Provider, MD  cephALEXin (KEFLEX) 250 MG capsule Take 1 capsule (250 mg total) by mouth 3 (three) times daily. 01/29/15   Coral Spikes, DO  Cholecalciferol (VITAMIN D3) 1000 UNITS CAPS Take by mouth.    Historical Provider, MD  clonazePAM (KLONOPIN) 0.5 MG tablet Take 0.5 mg by mouth daily.    Historical Provider, MD  CRANBERRY PO Take by mouth.    Historical Provider, MD  exemestane (AROMASIN) 25 MG tablet Take 1 tablet (25 mg total) by mouth daily after breakfast. 01/14/15   Coral Spikes, DO  Fluocinolone Acetonide 0.01 % SHAM Apply no more than 1 ounce to scalp once daily; work into [employer] and allow to remain on scalp for ~5 minutes. Remove from hair and scalp by rinsing thoroughly with water. 02/10/15   Coral Spikes, DO  glucose blood test strip Use to test blood sugar 3 times daily.  Dx Code:11.9 01/30/15   Coral Spikes, DO  hydrALAZINE (APRESOLINE) 50 MG tablet Take 1 tablet (50 mg total) by mouth 4 (four) times daily. 01/30/15   Coral Spikes, DO  insulin aspart protamine - aspart (NOVOLOG MIX 70/30 FLEXPEN)  (70-30) 100 UNIT/ML FlexPen Inject 50 units in the morning and 15 units in the evening 01/01/15   Jayce G Cook, DO  insulin aspart protamine - aspart (NOVOLOG MIX 70/30 FLEXPEN) (70-30) 100 UNIT/ML FlexPen Inject 0.35 mLs (35 Units total) into the skin 2 (two) times daily. 01/01/15   Coral Spikes, DO  Insulin Pen Needle (SURE-FINE PEN NEEDLES) 31G X 5 MM MISC Use to test blood sugar 3 times daily  Dx code E11.9 01/30/15   Coral Spikes, DO  lisinopril (PRINIVIL,ZESTRIL) 20 MG tablet Take 20 mg by mouth 2 (two) times daily.    Historical Provider, MD  metoprolol (LOPRESSOR) 50 MG tablet Take 1 tablet (50 mg total) by mouth 2 (two) times daily. 01/30/15   Coral Spikes, DO  Multiple Vitamins-Minerals (MULTIVITAMIN GUMMIES ADULT PO) Take by mouth.    Historical Provider, MD  omeprazole (PRILOSEC) 20 MG capsule Take 1 capsule (20 mg total) by mouth daily. 02/10/15   Coral Spikes, DO  TRUEPLUS LANCETS 30G MISC Use to test blood sugar 3 times a day  Dx code E11.9 01/30/15   Jayce G Cook, DO   BP 171/45 mmHg  Pulse 64  Temp(Src) 98.7 F (37.1 C) (Oral)  Resp 21  SpO2 99% Physical Exam  Constitutional: She is oriented to person, place, and time. She appears well-developed and well-nourished.  HENT:  Head: Normocephalic and atraumatic.  Mouth/Throat: Oropharynx is clear and moist.  Eyes: Conjunctivae are normal. Pupils are equal, round, and reactive to light. Right eye exhibits no discharge. Left eye exhibits no discharge. No scleral icterus.  Neck: Neck supple.  Cardiovascular: Normal rate, regular rhythm and normal heart sounds.   Pulmonary/Chest: Effort normal and breath sounds normal. No respiratory distress. She has no wheezes. She has no rales.  Abdominal: Soft. There is no tenderness.  Musculoskeletal: She exhibits no tenderness.  Neurological: She is alert and oriented to person, place, and time.  Patient with mild right-sided facial droop with mildly depressed nasolabial fold. Cranial nerves III  through XII appear to be otherwise grossly intact. Able to smile symmetric Patient has equal grip strength, motor and sensation appear to be intact in bilateral upper extremities. However, patient is unable to maneuver onto her back or extend her arms straight out due to generalized weakness. Motor and sensation appear to be intact in bilateral lower extremities.  Skin: Skin is warm and dry. No rash noted.  Psychiatric: She has a normal mood and affect.  Nursing note and vitals reviewed.   ED Course  Procedures (including critical care time) Labs Review Labs Reviewed  CBC WITH DIFFERENTIAL/PLATELET - Abnormal; Notable for the following:    RBC 3.73 (*)    Hemoglobin 11.3 (*)    HCT  35.2 (*)    All other components within normal limits  COMPREHENSIVE METABOLIC PANEL - Abnormal; Notable for the following:    Glucose, Bld 247 (*)    BUN 21 (*)    Creatinine, Ser 1.05 (*)    Total Protein 5.2 (*)    Albumin 2.6 (*)    GFR calc non Af Amer 45 (*)    GFR calc Af Amer 52 (*)    All other components within normal limits  URINALYSIS, ROUTINE W REFLEX MICROSCOPIC (NOT AT [facility]) - Abnormal; Notable for the following:    APPearance TURBID (*)    Hgb urine dipstick TRACE (*)    Protein, ur 30 (*)    Leukocytes, UA LARGE (*)    All other components within normal limits  URINE MICROSCOPIC-ADD ON - Abnormal; Notable for the following:    Bacteria, UA MANY (*)    All other components within normal limits  CBG MONITORING, ED    Imaging Review Ct Head Wo Contrast  02/18/2015   CLINICAL DATA:  Right facial droop with slurred speech and sudden onset confusion  EXAM: CT HEAD WITHOUT CONTRAST  TECHNIQUE: Contiguous axial images were obtained from the base of the skull through the vertex without intravenous contrast.  COMPARISON:  None.  FINDINGS: There is moderate diffuse atrophy. There is no intracranial mass, hemorrhage, extra-axial fluid collection, or midline shift. There is patchy small vessel  disease in the centra semiovale bilaterally. There is an age uncertain small infarct in the periphery of the midportion left frontal lobe superiorly. Gray-white compartments elsewhere appear normal. Bony calvarium appears intact. Mastoid air cells are clear.  IMPRESSION: Age uncertain small infarct superior midportion left frontal lobe. There is atrophy with periventricular small vessel disease. No hemorrhage or mass effect.   Electronically Signed   By: Lowella Grip III M.D.   On: 02/18/2015 13:38   I have personally reviewed and evaluated these images and lab results as part of my medical decision-making.   EKG Interpretation   Date/Time:  Tuesday February 18 2015 10:54:13 EDT Ventricular Rate:  68 PR Interval:  153 QRS Duration: 121 QT Interval:  396 QTC Calculation: 421 R Axis:   -5 Text Interpretation:  Sinus rhythm Ventricular premature complex LVH with  secondary repolarization abnormality No significant change since last  tracing Confirmed by POLLINA  MD, CHRISTOPHER [phone]) on 02/18/2015 11:00:56  AM     Meds given in ED:  Medications  cefTRIAXone (ROCEPHIN) 1 g in dextrose 5 % 50 mL IVPB (not administered)    New Prescriptions   No medications on file   Filed Vitals:   02/18/15 1357 02/18/15 1415 02/18/15 1500 02/18/15 1515  BP:  173/49 167/45 171/45  Pulse: 65 66 63 64  Temp:      TempSrc:      Resp: 20 15 21 21   SpO2: 99% 99% 99% 99%    MDM  Patient with a history of breast cancer, comes in for evaluation of facial droop and confusion. Patient middle status is baseline now per daughter in the room. Noticed left-sided facial droop this morning at 9:30 AM, is improving. Last seen normal 10:30 PM last night. Neuro exam as above. Physical exam is otherwise not concerning. Evidence of UTI on urinalysis-treated with Rocephin. Otherwise, Labs appear to be baseline for patient and are noncontributory. Will obtain CT head without contrast CT head shows age uncertain  small infarct in superior midportion of left frontal lobe. No hemorrhage or mass effect.  Neurology to see in the ED, recommends admission to medical service for TIA workup. Discussed with hospitalist, Erin Hearing, NP, to see in the ED. Patient admitted. Final diagnoses:  Confusion  Facial droop        Comer Locket, PA-C 02/18/15 [place], MD 02/19/15 [phone]

## 2015-02-18 NOTE — H&P (Signed)
[facility] History and Physical                                                                                    [name], is a 81 y.o. female  MRN: [id number]   DOB - 1921/11/27  Admit Date - 02/18/2015  Outpatient Primary MD for the patient is [name], DO  Referring MD: Betsey Holiday / ER  Consulting M.D:  Janann Colonel / Neurology  With History of -  Past Medical History  Diagnosis Date  . Arthritis   . Breast cancer     Hx of R breast cancer s/p mastectomy; Currently has L breast cancer (Diagnosed 2015).   . Diabetes mellitus without complication     Insulin dependent.   . Hyperlipidemia   . Hypertension   . History of DVT (deep vein thrombosis)   . Glaucoma       Past Surgical History  Procedure Laterality Date  . Breast surgery      R mastectomy   . Cholecystectomy    . Medial partial knee replacement Right     in for   Chief Complaint  Patient presents with  . Transient Ischemic Attack     HPI This is a 81 year old female patient with history of arthritis, breast cancer status post mastectomy on chronic exemestane, hypertension, diabetes on insulin, dyslipidemia, and history of DVT. Patient presents to the ER with symptoms of right facial drooping with confusion and drooling. In discussing with the patient's daughter, about 9:30 this morning she noticed the symptoms. She describes this as pt's mouth twisted and drooling but the patient was able to speak and her speech was slurred; she was confused. Symptoms lasted about 10 minutes. She reports the patient had similar episode on 8/23 which also resolved. Since the episode on 8/23 she has noted that the patient's left thumb stays flexed and she seems to have decreased movement of her left side. She appears to be dragging her left leg with walking. At baseline she has difficulty with balance and utilizes either a rolling walker or wheelchair to mobilize. She has also noticed that patient has been having difficulty  holding her spoon and getting this pain to her mouth while eating. She is not had any difficulty with swallowing or has not had any overt choking noticed. Patient denies any dizziness. Had any urinary symptoms or any abdominal pain, nausea, or vomiting. Patient had a remote neck surgery several years ago and has chronic difficulty with moving the right arm.  In the ER patient was afebrile, BP was 148/48, pulse was 68 and regular, respirations 25 with saturations 98% on room air. Noncontrasted CT of the head shows age indeterminate small infarct at the superior midportion of the left frontal lobe. There was also atrophy with prevented her small vessel disease without hemorrhage or mass effect. Urinalysis was abnormal and appeared consistent with a UTI. Electrolyte panel is unremarkable including stable renal function, glucose was elevated at 247, albumen low at 2.6 with a low total protein of 5.2. CBC was within normal limits per patient including stable hemoglobin 11.3. Urine culture has been obtained. In review of previous urine culture  data from 01/29/2015 patient had a Klebsiella pneumoniae A UTI with greater than 100,000 colonies that was resistant to ampicillin but sensitive to ceftriaxone. She has been given ceftriaxone in the ER. In review of the outpatient documentation patient was treated with cephalexin for her UTI. Urine culture has been obtained in the ER. Patient has been evaluated for neurology with a presumptive diagnosis of TIA.   Review of Systems   In addition to the HPI above,  No Fever-chills, myalgias or other constitutional symptoms No Headache, changes with Vision or hearing No problems swallowing food or Liquids, indigestion/reflux No Chest pain, Cough or Shortness of Breath, palpitations, orthopnea or DOE No Abdominal pain, N/V; no melena or hematochezia, no dark tarry stools, Bowel movements are regular, No dysuria, hematuria or flank pain No new skin rashes, lesions, masses or  bruises, No new joints pains-aches No recent weight gain or loss No polyuria, polydypsia or polyphagia,  *A full 10 point Review of Systems was done, except as stated above, all other Review of Systems were negative.  Social History Social History  Substance Use Topics  . Smoking status: Never Smoker   . Smokeless tobacco: Never Used  . Alcohol Use: No    Resides at: Private residence  Lives with:  Daughter  Ambulatory status: Rolling walker or wheelchair-and baseline patient has limited mobility secondary to prior knee surgery and stiff right lower extremity   Family History Family History  Problem Relation Age of Onset  . Diabetes Son   . Diabetes Son   . Heart disease Mother      Prior to Admission medications   Medication Sig Start Date End Date Taking? Authorizing Provider  aspirin 81 MG tablet Take 81 mg by mouth at bedtime.    Yes Historical Provider, MD  atorvastatin (LIPITOR) 40 MG tablet Take 40 mg by mouth daily at 6 PM.    Yes Historical Provider, MD  bimatoprost (LUMIGAN) 0.01 % SOLN Place 1 drop into both eyes at bedtime.   Yes Historical Provider, MD  brimonidine (ALPHAGAN P) 0.1 % SOLN Place 1 drop into both eyes 2 (two) times daily. 01/14/15  Yes Coral Spikes, DO  Calcium Carb-Cholecalciferol (CALCIUM 600 + D PO) Take 1 tablet by mouth daily.    Yes Historical Provider, MD  Cholecalciferol (VITAMIN D3) 1000 UNITS CAPS Take 1 capsule by mouth daily.    Yes Historical Provider, MD  clonazePAM (KLONOPIN) 0.5 MG tablet Take 0.5 mg by mouth at bedtime.    Yes Historical Provider, MD  CRANBERRY PO Take by mouth.   Yes Historical Provider, MD  exemestane (AROMASIN) 25 MG tablet Take 1 tablet (25 mg total) by mouth daily after breakfast. Patient taking differently: Take 25 mg by mouth at bedtime.  01/14/15  Yes Coral Spikes, DO  FLUOCINOLONE ACETONIDE SCALP 0.01 % OIL Apply 1 application topically daily. 02/10/15  Yes Historical Provider, MD  hydrALAZINE (APRESOLINE)  50 MG tablet Take 1 tablet (50 mg total) by mouth 4 (four) times daily. 01/30/15  Yes Coral Spikes, DO  insulin aspart protamine - aspart (NOVOLOG MIX 70/30 FLEXPEN) (70-30) 100 UNIT/ML FlexPen Inject 50 units in the morning and 15 units in the evening 01/01/15  Yes Jayce G Cook, DO  lisinopril (PRINIVIL,ZESTRIL) 20 MG tablet Take 20 mg by mouth 2 (two) times daily.   Yes Historical Provider, MD  meloxicam (MOBIC) 15 MG tablet Take 15 mg by mouth daily. 01/31/15  Yes Historical Provider, MD  metoprolol succinate (  TOPROL-XL) 50 MG 24 hr tablet Take 50 mg by mouth 2 (two) times daily. 01/31/15  Yes Historical Provider, MD  Multiple Vitamins-Minerals (MULTIVITAMIN GUMMIES ADULT PO) Take 1 tablet by mouth daily.    Yes Historical Provider, MD  omeprazole (PRILOSEC) 20 MG capsule Take 1 capsule (20 mg total) by mouth daily. 02/10/15  Yes Coral Spikes, DO  cephALEXin (KEFLEX) 250 MG capsule Take 1 capsule (250 mg total) by mouth 3 (three) times daily. Patient not taking: Reported on 02/18/2015 01/29/15   Coral Spikes, DO  Fluocinolone Acetonide 0.01 % SHAM Apply no more than 1 ounce to scalp once daily; work into [employer] and allow to remain on scalp for ~5 minutes. Remove from hair and scalp by rinsing thoroughly with water. Patient not taking: Reported on 02/18/2015 02/10/15   Coral Spikes, DO  insulin aspart protamine - aspart (NOVOLOG MIX 70/30 FLEXPEN) (70-30) 100 UNIT/ML FlexPen Inject 0.35 mLs (35 Units total) into the skin 2 (two) times daily. Patient not taking: Reported on 02/18/2015 01/01/15   Coral Spikes, DO  metoprolol (LOPRESSOR) 50 MG tablet Take 1 tablet (50 mg total) by mouth 2 (two) times daily. Patient not taking: Reported on 02/18/2015 01/30/15   Coral Spikes, DO    No Known Allergies  Physical Exam  Vitals  Blood pressure 171/45, pulse 64, temperature 98.7 F (37.1 C), temperature source Oral, resp. rate 21, SpO2 99 %.   General:  In no acute distress, appears healthy and well  nourished  Psych:  Flat affect, Awake Alert, Oriented X 3. Patient is able to speak without slurring during my examination, limited verbal interaction. He appears to have short term memory deficits  Neuro: CN II through XII intact, Strength 3/5 all 4 extremities, Sensation intact all 4 extremities. Has difficulty lifting arms and legs off the bed suspect this is chronic  ENT:  Ears and Eyes appear Normal, Conjunctivae clear, PER. Very dry oral mucosa without erythema does have significant yellow coating of her tongue  Neck:  Supple, No lymphadenopathy appreciated  Respiratory:  Symmetrical chest wall movement, Good air movement bilaterally, CTAB. Room Air  Cardiac:  RRR, No Murmurs, no LE edema noted, no JVD, No carotid bruits, peripheral pulses palpable at 2+  Abdomen:  Positive bowel sounds, Soft, Non tender, Non distended,  No masses appreciated, no obvious hepatosplenomegaly  Skin:  No Cyanosis, Normal Skin Turgor, No Skin Rash or Bruise.  Extremities: Symmetrical without obvious trauma or injury,  no effusions. Multiple well-healed old scars to right knee area  Data Review  CBC  Recent Labs Lab 02/18/15 1117  WBC 8.0  HGB 11.3*  HCT 35.2*  PLT 338  MCV 94.4  MCH 30.3  MCHC 32.1  RDW 13.4  LYMPHSABS 2.8  MONOABS 0.5  EOSABS 0.2  BASOSABS 0.0    Chemistries   Recent Labs Lab 02/18/15 1117  NA 139  K 4.1  CL 107  CO2 27  GLUCOSE 247*  BUN 21*  CREATININE 1.05*  CALCIUM 9.5  AST 22  ALT 21  ALKPHOS 60  BILITOT 0.6    estimated creatinine clearance is 32.6 mL/min (by C-G formula based on Cr of 1.05).  No results for input(s): TSH, T4TOTAL, T3FREE, THYROIDAB in the last 72 hours.  Invalid input(s): FREET3  Coagulation profile No results for input(s): INR, PROTIME in the last 168 hours.  No results for input(s): DDIMER in the last 72 hours.  Cardiac Enzymes No results for input(s): CKMB,  TROPONINI, MYOGLOBIN in the last 168 hours.  Invalid  input(s): CK  Invalid input(s): POCBNP  Urinalysis    Component Value Date/Time   COLORURINE YELLOW 02/18/2015 1440   COLORURINE Yellow 03/01/2012 2104   APPEARANCEUR TURBID* 02/18/2015 1440   APPEARANCEUR Cloudy 03/01/2012 2104   LABSPEC 1.015 02/18/2015 1440   LABSPEC 1.013 03/01/2012 2104   PHURINE 6.0 02/18/2015 1440   PHURINE 5.0 03/01/2012 2104   GLUCOSEU NEGATIVE 02/18/2015 1440   GLUCOSEU Negative 03/01/2012 2104   HGBUR TRACE* 02/18/2015 1440   HGBUR Negative 03/01/2012 2104   BILIRUBINUR NEGATIVE 02/18/2015 1440   BILIRUBINUR neg 01/29/2015 1424   BILIRUBINUR Negative 03/01/2012 2104   KETONESUR NEGATIVE 02/18/2015 1440   KETONESUR Negative 03/01/2012 2104   PROTEINUR 30* 02/18/2015 1440   PROTEINUR 100 01/29/2015 1424   PROTEINUR Negative 03/01/2012 2104   UROBILINOGEN 0.2 02/18/2015 1440   UROBILINOGEN 0.2 01/29/2015 1424   NITRITE NEGATIVE 02/18/2015 1440   NITRITE pos 01/29/2015 1424   NITRITE Negative 03/01/2012 2104   LEUKOCYTESUR LARGE* 02/18/2015 1440   LEUKOCYTESUR Negative 03/01/2012 2104    Imaging results:   Ct Head Wo Contrast  02/18/2015   CLINICAL DATA:  Right facial droop with slurred speech and sudden onset confusion  EXAM: CT HEAD WITHOUT CONTRAST  TECHNIQUE: Contiguous axial images were obtained from the base of the skull through the vertex without intravenous contrast.  COMPARISON:  None.  FINDINGS: There is moderate diffuse atrophy. There is no intracranial mass, hemorrhage, extra-axial fluid collection, or midline shift. There is patchy small vessel disease in the centra semiovale bilaterally. There is an age uncertain small infarct in the periphery of the midportion left frontal lobe superiorly. Gray-white compartments elsewhere appear normal. Bony calvarium appears intact. Mastoid air cells are clear.  IMPRESSION: Age uncertain small infarct superior midportion left frontal lobe. There is atrophy with periventricular small vessel disease. No  hemorrhage or mass effect.   Electronically Signed   By: Lowella Grip III M.D.   On: 02/18/2015 13:38     EKG: (Independently reviewed)  sinus rhythm with unifocal PVC, ventricular rate 60 bpm, QTC 421 ms, inverted T waves in leads 2 and aVF as well as in V5 and V6. East T-wave inversions are similar to EKG from September 2013. No acute ischemic changes identified   Assessment & Plan  Principal Problem:   TIA  -Admit to telemetry -Neurology has evaluated in ER and recommends TIA evaluation -Stat MRI/MRA brain -Carotid duplex and echocardiogram -2 view chest x-ray -Hemoglobin A1c and lipid panel -PT/OT/SLP evaluations; nothing by mouth until bedside swallow evaluation by RN -Aspirin full dose daily-is on 81 mg by mouth daily prior to admission -Acute urinary tract infection could be influencing mentation changes -Patient may have a diagnosis of undiagnosed dementia based on atrophic findings on CT-UTI could also be precipitating atypical seizures so if MRI negative consider checking EEG  Active Problems:   UTI  -Treated with cephalexin as an outpatient beginning 8/18 for Klebsiella UTI -Urinalysis appears consistent with recurrent UTI -Continue Rocephin -Follow up on cultures    Essential hypertension -Controlled -Continue preadmission Toprol, hydralazine and Prinivil but monitor renal function in the event this medication needs to be discontinued since has chronic kidney disease stage III    Diabetes mellitus with insulin therapy -Continue 70/30 insulin twice daily -Check hemoglobin A1c as above -Check CBGs and provide SSI    CKD, stage III -Baseline renal function appears to be 25/1.06 and current renal function is 21/1.05 -  GFR greater than 50 but less than 60    HLD  -Continue statin    History of breast cancer -Continue Exemestane    Oral thrush -Nystatin swish and swallow -Suspect precipitated by recent oral cephalexin use for UTI    DVT Prophylaxis:  Lovenox  Family Communication:   Daughter at bedside  Code Status:  Full code  Condition:  Stable  Discharge disposition:  Anticipate discharge home after TIA workup completed and cultures returned regarding UTI  Time spent in minutes : 60      ELLIS,ALLISON L. ANP on 02/18/2015 at 5:32 PM  Between 7am to 7pm - Pager - [phone]  After 7pm go to [email or web address] - password [email or web address]  And look for the night coverage person covering me after hours  [facility]

## 2015-02-19 ENCOUNTER — Observation Stay (HOSPITAL_BASED_OUTPATIENT_CLINIC_OR_DEPARTMENT_OTHER): Payer: Medicare PPO

## 2015-02-19 DIAGNOSIS — E119 Type 2 diabetes mellitus without complications: Secondary | ICD-10-CM | POA: Diagnosis not present

## 2015-02-19 DIAGNOSIS — G451 Carotid artery syndrome (hemispheric): Secondary | ICD-10-CM

## 2015-02-19 DIAGNOSIS — G459 Transient cerebral ischemic attack, unspecified: Secondary | ICD-10-CM

## 2015-02-19 DIAGNOSIS — Z794 Long term (current) use of insulin: Secondary | ICD-10-CM

## 2015-02-19 DIAGNOSIS — Z853 Personal history of malignant neoplasm of breast: Secondary | ICD-10-CM

## 2015-02-19 DIAGNOSIS — N183 Chronic kidney disease, stage 3 (moderate): Secondary | ICD-10-CM | POA: Diagnosis not present

## 2015-02-19 DIAGNOSIS — E785 Hyperlipidemia, unspecified: Secondary | ICD-10-CM | POA: Diagnosis not present

## 2015-02-19 DIAGNOSIS — I10 Essential (primary) hypertension: Secondary | ICD-10-CM | POA: Diagnosis not present

## 2015-02-19 DIAGNOSIS — N390 Urinary tract infection, site not specified: Secondary | ICD-10-CM

## 2015-02-19 LAB — LIPID PANEL
Cholesterol: 84 mg/dL (ref 0–200)
HDL: 27 mg/dL — AB (ref >40)
LDL CALC: 39 mg/dL (ref 0–99)
TRIGLYCERIDES: 88 mg/dL (ref <150)
Total CHOL/HDL Ratio: 3.1 RATIO
VLDL: 18 mg/dL (ref 0–40)

## 2015-02-19 LAB — GLUCOSE, CAPILLARY
GLUCOSE-CAPILLARY: 156 mg/dL — AB (ref 65–99)
Glucose-Capillary: 182 mg/dL — ABNORMAL HIGH (ref 65–99)
Glucose-Capillary: 72 mg/dL (ref 65–99)

## 2015-02-19 MED ORDER — CLOPIDOGREL BISULFATE 75 MG PO TABS: 75.0000 mg | ORAL_TABLET | Freq: Every day | ORAL | Status: DC

## 2015-02-19 MED ORDER — CLOPIDOGREL BISULFATE 75 MG PO TABS
75.0000 mg | ORAL_TABLET | Freq: Every day | ORAL | Status: DC
  Administered 2015-02-19 – 2015-02-20 (×2): 75 mg via ORAL
  Filled 2015-02-19 (×2): qty 1

## 2015-02-19 NOTE — Progress Notes (Signed)
[facility] PROGRESS NOTE  Margarette Vannatter DJT:[id number] DOB: 1921/09/13 DOA: 02/18/2015 PCP: [name], DO  HPI/Subjective: [name] is a 81 y.o. African American female who presents to the unit from the ED following right sided facial droop, and confusion that occurred at 9:30 am on 02/18/15 and lasted for 5-10 minutes and fully resolved upon arrival to ED. Last known normal prior to the event was 10:30 PM on 02/17/2015. Patient is wheel chair bound, full ADL's and per daughter likely some mild dementia. PMH includes: arthritis, breast cancer status post mastectomy on chronic exemestane, hypertension, diabetes on insulin, dyslipidemia, and history of DVT.  Today the patient appears to be doing well. Responsive to commands and alert/oriented .She reports no pain, dizziness, vision changes, or HA. No apparent facial droop at this time, but of note is a prominent right sided gaze (totally ignoring left side).    Assessment/Plan: 1. TIA: Patient presented to ED with stroke-like symptoms, however CT/MRI in ED revealed no acute infarct. Per neurology recommendations, plan is to continue work-up for TIA, which includes: Echocardiogram and Carotid doppler (results pending),  HgbA1C (results pending), fasting lipid panel (see results in HLD below), PT/OT/Speech consult with swallow screen (results pending), Initiate prophylactic antiplatelet (Aspitin 325 QD), Frequent neuro check, NPO until stroke swallow screen. 2. UTI: Patient presented to ED with history of Klebsiella UTI that has been treated with Cephalexin starting on 01/30/15. Labs in ED show continued UTI. Rocephin started in ED and cultures obtained. Plan is to continue Rocephin treatment until cultures are completed.  3. HTN: Patient presented with a chronic history of HTN that is controlled with Toprol, hydralazine and Prinivil. Current BP is 168/55. Plan is to continue outpatient medications for BP control and continue to monitor kidney function.   4. DM2: Patient presented with DM2 controlled by insulin 70/30 BID. Current glucose is 182. Plan is to continue insulin as previously described and check A1c per Neurology recommendations above.  5. CKD stage 3 : Patient presented with chronic KD with baseline function of 25/1.06. Current kidney function is 21/1.05. Plan is to continue monitoring kidney function daily.  6. HLD: Patient presents with history of chronic HLD. Lipid panel ordered per Neurology recommendation. Results are WNL (Cholesterol: 84, Triglycerides: 88, VLDL: 18, LDL: 39) with the exception of low HDL (27). Plan at this point is to continue Atorvastatin 40 mg PO.  7. Breast Cancer: History of previous left sided breast cancer and mastectomy that is treated with chronic Exemestane. Plan is to continue Exemestane as instructed by Oncology.  8. Thrush: Presented with oral thrush presumed to be caused by UTI abx. Plan is to continue Nystatin swish/spit until resolved.  9. DVT history: History of DVT in the past. Plan is to continue prophylaxis with Lovenox.   Code Status: Full Family Communication: Patient  Disposition Plan: Anticipated home post TIA workup and urine cultures   Consultants:  Neurology consultation on 02/18/2015: See TIA note above for recommendations   Procedures:  None   Antibiotics: Continue Ceftraixone 1 g with 5% dextrose solution QD as started on 02/18/2015 until urine culture results are obtained.   Objective: Filed Vitals:   02/19/15 0600  BP: 191/55  Pulse: 62  Temp: 98.8 F (37.1 C)  Resp: 18   No intake or output data in the 24 hours ending 02/19/15 0953 Filed Weights   02/18/15 2011  Weight: 68.1 kg (150 lb 2.1 oz)    Exam:   General: Frail-appearing elderly woman; Well-developed and  well-nourished. Oriented to person, time, and place.   Cardiovascular: RRR, no M/R/G  Respiratory: CTA b/l, No wheeze, crackles,  or stridor   Abdomen: Non-tender, non-distended  abdomen  Neurological: Oriented to person, time, and place. Fixed right sided gaze, completely ignoring the left side. No facial droop noted at this time. Equal grip strength, motor, and sensation appears to be intact bilaterally on upper extremities. Motor and sensation appears to be intact bilaterally in lower extremity.   Data Reviewed: Basic Metabolic Panel:  Recent Labs Lab 02/18/15 1117  NA 139  K 4.1  CL 107  CO2 27  GLUCOSE 247*  BUN 21*  CREATININE 1.05*  CALCIUM 9.5   Liver Function Tests:  Recent Labs Lab 02/18/15 1117  AST 22  ALT 21  ALKPHOS 60  BILITOT 0.6  PROT 5.2*  ALBUMIN 2.6*   No results for input(s): LIPASE, AMYLASE in the last 168 hours. No results for input(s): AMMONIA in the last 168 hours. CBC:  Recent Labs Lab 02/18/15 1117  WBC 8.0  NEUTROABS 4.4  HGB 11.3*  HCT 35.2*  MCV 94.4  PLT 338   Cardiac Enzymes: No results for input(s): CKTOTAL, CKMB, CKMBINDEX, TROPONINI in the last 168 hours. BNP (last 3 results) No results for input(s): BNP in the last 8760 hours.  ProBNP (last 3 results) No results for input(s): PROBNP in the last 8760 hours.  CBG:  Recent Labs Lab 02/18/15 2038 02/19/15 0626  GLUCAP 211* 182*    No results found for this or any previous visit (from the past 240 hour(s)).   Studies: Dg Chest 2 View  02/18/2015   CLINICAL DATA:  TIA, no chest complaints, headache and dizziness  EXAM: CHEST  2 VIEW  COMPARISON:  None.  FINDINGS: Suboptimal positioning. Mild cardiac enlargement. Status post prior CABG. Aortic atherosclerosis. No consolidation or effusion. 5 mm probable granuloma right middle lobe.  IMPRESSION: No significant acute findings.  Mild cardiac enlargement.   Electronically Signed   By: Skipper Cliche M.D.   On: 02/18/2015 19:39   Ct Head Wo Contrast  02/18/2015   CLINICAL DATA:  Right facial droop with slurred speech and sudden onset confusion  EXAM: CT HEAD WITHOUT CONTRAST  TECHNIQUE: Contiguous  axial images were obtained from the base of the skull through the vertex without intravenous contrast.  COMPARISON:  None.  FINDINGS: There is moderate diffuse atrophy. There is no intracranial mass, hemorrhage, extra-axial fluid collection, or midline shift. There is patchy small vessel disease in the centra semiovale bilaterally. There is an age uncertain small infarct in the periphery of the midportion left frontal lobe superiorly. Gray-white compartments elsewhere appear normal. Bony calvarium appears intact. Mastoid air cells are clear.  IMPRESSION: Age uncertain small infarct superior midportion left frontal lobe. There is atrophy with periventricular small vessel disease. No hemorrhage or mass effect.   Electronically Signed   By: Lowella Grip III M.D.   On: 02/18/2015 13:38   Mr Jodene Nam Head Wo Contrast  02/18/2015   CLINICAL DATA:  Multiple episodes of Transient RIGHT facial droop which resolved. Dementia.  EXAM: MRI HEAD WITHOUT CONTRAST  MRA HEAD WITHOUT CONTRAST  TECHNIQUE: Multiplanar, multiecho pulse sequences of the brain and surrounding structures were obtained without intravenous contrast. Angiographic images of the head were obtained using MRA technique without contrast.  COMPARISON:  CT head 02/18/2015.  FINDINGS: MRI HEAD FINDINGS  No evidence for acute infarction, hemorrhage, mass lesion, hydrocephalus, or extra-axial fluid. Generalized atrophy. Chronic microvascular ischemic change  of a moderate to severe nature. 6 mm Subpial cyst or remote lacunar infarct RIGHT paramedian upper pons, doubtful significance. Remote lacunar infarcts affect the deep nuclei, most notable LEFT thalamus. LEFT cataract extraction. Severe cervical spondylosis. Extracranial soft tissues demonstrate BILATERAL mastoid effusions of uncertain significance.  MRA HEAD FINDINGS  Estimated 50% stenosis of the inferior cavernous segment RIGHT ICA. No supraclinoid disease. Mild irregularity of the cavernous and supraclinoid  LEFT ICA without a focal area of narrowing.  Mild irregularity proximal basilar artery. Mild irregularity both distal vertebral arteries. Focal 50% stenosis distal LEFT vertebral. Dolichoectasia of both proximal middle cerebral arteries without focal narrowing. 50% stenosis of non-dominant LEFT A1 ACA. No RIGHT A1 ACA disease. No LEFT PCA disease. 50% stenosis RIGHT P1 PCA. No cerebellar branch occlusion. Moderately diseased LEFT PICA. No intracranial aneurysm.  The area of concern on CT represents partial volume averaging of a sulcus in this patient with atrophic change.  IMPRESSION: No acute stroke is evident.  Moderately advanced chronic microvascular ischemic change with generalized atrophy.  6 mm Subpial cyst or remote lacunar infarct RIGHT paramedian upper pons, doubtful significance.  Moderate irregularity throughout the intracranial vasculature consistent with intracranial atherosclerotic disease, without flow-limiting stenosis or occlusion.   Electronically Signed   By: Staci Righter M.D.   On: 02/18/2015 19:21   Mr Brain Wo Contrast  02/18/2015   CLINICAL DATA:  Multiple episodes of Transient RIGHT facial droop which resolved. Dementia.  EXAM: MRI HEAD WITHOUT CONTRAST  MRA HEAD WITHOUT CONTRAST  TECHNIQUE: Multiplanar, multiecho pulse sequences of the brain and surrounding structures were obtained without intravenous contrast. Angiographic images of the head were obtained using MRA technique without contrast.  COMPARISON:  CT head 02/18/2015.  FINDINGS: MRI HEAD FINDINGS  No evidence for acute infarction, hemorrhage, mass lesion, hydrocephalus, or extra-axial fluid. Generalized atrophy. Chronic microvascular ischemic change of a moderate to severe nature. 6 mm Subpial cyst or remote lacunar infarct RIGHT paramedian upper pons, doubtful significance. Remote lacunar infarcts affect the deep nuclei, most notable LEFT thalamus. LEFT cataract extraction. Severe cervical spondylosis. Extracranial soft tissues  demonstrate BILATERAL mastoid effusions of uncertain significance.  MRA HEAD FINDINGS  Estimated 50% stenosis of the inferior cavernous segment RIGHT ICA. No supraclinoid disease. Mild irregularity of the cavernous and supraclinoid LEFT ICA without a focal area of narrowing.  Mild irregularity proximal basilar artery. Mild irregularity both distal vertebral arteries. Focal 50% stenosis distal LEFT vertebral. Dolichoectasia of both proximal middle cerebral arteries without focal narrowing. 50% stenosis of non-dominant LEFT A1 ACA. No RIGHT A1 ACA disease. No LEFT PCA disease. 50% stenosis RIGHT P1 PCA. No cerebellar branch occlusion. Moderately diseased LEFT PICA. No intracranial aneurysm.  The area of concern on CT represents partial volume averaging of a sulcus in this patient with atrophic change.  IMPRESSION: No acute stroke is evident.  Moderately advanced chronic microvascular ischemic change with generalized atrophy.  6 mm Subpial cyst or remote lacunar infarct RIGHT paramedian upper pons, doubtful significance.  Moderate irregularity throughout the intracranial vasculature consistent with intracranial atherosclerotic disease, without flow-limiting stenosis or occlusion.   Electronically Signed   By: Staci Righter M.D.   On: 02/18/2015 19:21    Scheduled Meds: . aspirin  300 mg Rectal Daily   Or  . aspirin  325 mg Oral Daily  . atorvastatin  40 mg Oral q1800  . brimonidine  1 drop Both Eyes BID  . cefTRIAXone (ROCEPHIN)  IV  1 g Intravenous Q24H  . clonazePAM  0.5 mg Oral QHS  . enoxaparin (LOVENOX) injection  40 mg Subcutaneous Q24H  . exemestane  25 mg Oral QHS  . hydrALAZINE  50 mg Oral QID  . insulin aspart  0-15 Units Subcutaneous TID AC & HS  . insulin aspart protamine- aspart  15 Units Subcutaneous Q supper  . insulin aspart protamine- aspart  50 Units Subcutaneous Q breakfast  . latanoprost  1 drop Both Eyes QHS  . lisinopril  20 mg Oral BID  . metoprolol succinate  50 mg Oral BID   . nystatin  5 mL Oral QID  . pantoprazole  40 mg Oral Daily  . pneumococcal 23 valent vaccine  0.5 mL Intramuscular Tomorrow-1000   Continuous Infusions:   Principal Problem:   TIA (transient ischemic attack) Active Problems:   Essential hypertension   Diabetes mellitus with insulin therapy   HLD (hyperlipidemia)   History of breast cancer   UTI (lower urinary tract infection)   CKD (chronic kidney disease), stage III   Oral thrush    Time spent: 20 minutes [name] Zeltman, PA-S  [facility]  If 7PM-7AM, please contact night-coverage at [email or web address], password [facility] 02/19/2015, 9:53 AM     Attending MD note  Patient was seen, examined,treatment plan was discussed with the PA-S.  I have personally reviewed the clinical findings, lab, imaging studies and management of this patient in detail. I agree with the documentation, as recorded by  PA-S  81 year old female presented with right facial droop and confusion, all of this is resolved. Patient is completely awake and alert this morning. MRI brain negative, await echo/carotids and neurology input. Continue with current plan as outlined above.   [facility]

## 2015-02-19 NOTE — Progress Notes (Signed)
  Echocardiogram 2D Echocardiogram has been performed.  Jennette Dubin 02/19/2015, 11:42 AM

## 2015-02-19 NOTE — Evaluation (Signed)
Speech Language Pathology Evaluation Patient Details Name: [name] MRN: [id number] DOB: 15-Sep-1921 Today's Date: 02/19/2015 Time: 4010-     Problem List:  Patient Active Problem List   Diagnosis Date Noted  . TIA (transient ischemic attack) 02/18/2015  . UTI (lower urinary tract infection) 02/18/2015  . CKD (chronic kidney disease), stage III 02/18/2015  . Oral thrush 02/18/2015  . Itchy scalp 02/10/2015  . Right knee pain 02/10/2015  . Essential hypertension 01/01/2015  . Diabetes mellitus with insulin therapy 01/01/2015  . HLD (hyperlipidemia) 01/01/2015  . Decubitus ulcer of sacral region, stage 2 01/01/2015  . History of breast cancer 01/01/2015  . Breast cancer, left breast 01/01/2015  . Osteoarthritis 01/01/2015  . Glaucoma 01/01/2015  . Anxiety state 01/01/2015  . Preventative health care 01/01/2015   Past Medical History:  Past Medical History  Diagnosis Date  . Hyperlipidemia   . Hypertension   . History of DVT (deep vein thrombosis) 1980's    BLE  . Type II diabetes mellitus     Insulin dependent.   . Decubitus ulcer, buttock   . TIA (transient ischemic attack) 02/18/2015  . Arthritis     "all over"  . Breast cancer     Hx of R breast cancer s/p mastectomy; Currently has L breast cancer (Diagnosed 2015).   . Glaucoma, bilateral    Past Surgical History:  Past Surgical History  Procedure Laterality Date  . Cholecystectomy    . Medial partial knee replacement Right 1980's?  . Mastectomy Right 1980's  . Breast biopsy Right 1980's  . Abdominal hysterectomy     HPI:  81 year old with a history of arthritis, breast cancer, retention, diabetes. The patient is evaluated for right facial droop, slurred speech, and mild confusion that was self-limiting over 15 minutes.  MRI negative.    Assessment / Plan / Recommendation Clinical Impression  Pt presents with cogntive deficits consistent with baseline function: there is notable long-term and short-term memory  loss; repetitive story-telling and disorientation to time/place; limited mental flexibility and judgment.  Pt is pleasant, talkative, states she is at home.  No SLP needs are identified - recommend continued 24/7 supervision at home given cognition.      SLP Assessment  Patient does not need any further Speech Lanaguage Pathology Services    Follow Up Recommendations  None            Pertinent Vitals/Pain Pain Assessment: No/denies pain   SLP Goals     SLP Evaluation Prior Functioning  Cognitive/Linguistic Baseline: Baseline deficits Baseline deficit details: disoriented to time and place; short and long term memory loss Type of Home: House  Lives With: Daughter Available Help at Discharge: Family Vocation:  (worked as a Secretary/administrator)   Clinical biochemist Status: History of cognitive impairments - at baseline Arousal/Alertness: Awake/alert Orientation Level: Oriented to person;Disoriented to place;Disoriented to time;Disoriented to situation Attention: Focused Focused Attention: Appears intact Memory: Impaired Memory Impairment: Storage deficit;Retrieval deficit;Decreased recall of new information;Decreased long term memory Decreased Long Term Memory: Verbal basic;Functional basic Awareness: Impaired Problem Solving: Impaired Safety/Judgment: Impaired    Comprehension  Auditory Comprehension Overall Auditory Comprehension: Appears within functional limits for tasks assessed Visual Recognition/Discrimination Discrimination: Not tested Reading Comprehension Reading Status: Not tested    Expression Expression Primary Mode of Expression: Verbal Verbal Expression Overall Verbal Expression: Appears within functional limits for tasks assessed   Oral / Motor Oral Motor/Sensory Function Overall Oral Motor/Sensory Function: Appears within functional limits for tasks assessed Motor Speech  Overall Motor Speech: Appears within functional limits for tasks assessed    GO Functional Assessment Tool Used: clinical judgement Functional Limitations: Memory Memory Current Status (Q9826): At least 60 percent but less than 80 percent impaired, limited or restricted Memory Goal Status (E1583): At least 60 percent but less than 80 percent impaired, limited or restricted Memory Discharge Status [phone]): At least 60 percent but less than 80 percent impaired, limited or restricted   Juan Quam Laurice 02/19/2015, 3:49 PM

## 2015-02-19 NOTE — Progress Notes (Addendum)
Occupational Therapy Evaluation Patient Details Name: [name] MRN: [id number] DOB: 06/25/1921 Today's Date: 02/19/2015    History of Present Illness Pt is a 81 year old with a history of arthritis, breast cancer, retention, diabetes. The patient is evaluated for right facial droop, slurred speech, and mild confusion. MRI/MRA without contrast revealed no acute stroke.    Clinical Impression   Pt admitted with the above diagnoses and presents with below problem list. Pt will benefit from continued acute OT to address the below listed deficits and maximize independence with BADLs prior to d/c to venue below. PTA pt appears to have been w/c level for mobility and needed assist for bathing/dressing.  Pt is currently mod-total A with bed level ADLs; +2 physical assist for transfers. No family present on evaluation, not clear how removed from baseline pt currently is. Recommending SNF at d/c due to current level of assist. If pt d/c home with family will need lift equipment if they do not already have. OT to continue to follow acutely.      Follow Up Recommendations  Supervision/Assistance - 24 hour;SNF    Equipment Recommendations  Other (comment) (defer to next venue)    Recommendations for Other Services PT consult     Precautions / Restrictions Precautions Precautions: Fall Restrictions Weight Bearing Restrictions: No      Mobility Bed Mobility Overal bed mobility: Needs Assistance Bed Mobility: Supine to Sit     Supine to sit: Total assist;+2 for physical assistance     General bed mobility comments: Total A for supine to EOB with HOB elevated  Transfers                 General transfer comment: Nurse reports her understanding that daughter has lift equipment at home. Transfer not attempted due to zero sitting balance.     Balance Overall balance assessment: Needs assistance Sitting-balance support: Feet supported Sitting balance-Leahy Scale: Zero                                       ADL Overall ADL's : Needs assistance/impaired Eating/Feeding: Moderate assistance;Sitting Eating/Feeding Details (indicate cue type and reason): able to touch mouth with Lt hand, decreased grip strength Grooming: Maximal assistance;Sitting   Upper Body Bathing: Bed level;Maximal assistance   Lower Body Bathing: Total assistance;Bed level   Upper Body Dressing : Maximal assistance;Bed level   Lower Body Dressing: Total assistance;Bed level   Toilet Transfer: Total assistance;+2 for physical assistance   Toileting- Clothing Manipulation and Hygiene: Total assistance;Bed level;+2 for physical assistance         General ADL Comments: Pt completed bed mobility with total assit. Pt needing total assist to maintain sitting balance.      Vision Additional Comments: difficult to assess vision due to impaired cognition   Perception     Praxis Praxis Praxis tested?: Deficits Deficits: Initiation Praxis-Other Comments: When asked to bring her feet to the edge of the bed pt responded, "I don't know how to do that."    Pertinent Vitals/Pain Pain Assessment: No/denies pain     Hand Dominance     Extremity/Trunk Assessment Upper Extremity Assessment Upper Extremity Assessment: Generalized weakness;RUE deficits/detail;LUE deficits/detail;Difficult to assess due to impaired cognition RUE Deficits / Details: Spasticity noted in RUE, Modified Ashworth 2/5; spasticity in digits is progressively worse from 1st to 5th digits.  RUE Coordination: decreased fine motor;decreased gross motor LUE Deficits / Details:  grossly 3/5 elbow and distal, 2+/5 at shoulder level; thumb in adducted/flexed position  LUE Coordination: decreased fine motor;decreased gross motor   Lower Extremity Assessment Lower Extremity Assessment: Defer to PT evaluation       Communication Communication Communication: HOH;No difficulties   Cognition Arousal/Alertness:  Awake/alert Behavior During Therapy: WFL for tasks assessed/performed Overall Cognitive Status: History of cognitive impairments - at baseline       Memory: Decreased short-term memory;Decreased recall of precautions             General Comments       Exercises       Shoulder Instructions      Home Living Family/patient expects to be discharged to:: Private residence (Simultaneous filing. User may not have seen previous data.) Living Arrangements: Children;Other relatives Available Help at Discharge: Family   Home Access: Other (comment) (Pt reports ramped entry.)                     Home Equipment: Other (comment) (walker, w/c)   Additional Comments: Limited data due to impaired cognition and no family present at evaluation.       Prior Functioning/Environment Level of Independence: Needs assistance  Gait / Transfers Assistance Needed: w/c level; possible rw use per pt and chart review ADL's / Homemaking Assistance Needed: Pt lives with daughter and reports she has a nurse come to the home to assist with bathing/dressing. Pt reports nurse does, "all of it (bathing/dressing)." Communication / Swallowing Assistance Needed: delayed responses at times      OT Diagnosis: Generalized weakness;Cognitive deficits;Paresis;Apraxia;Other (comment) (spasticity)   OT Problem List: Decreased strength;Decreased range of motion;Decreased activity tolerance;Impaired balance (sitting and/or standing);Decreased coordination;Decreased cognition;Decreased safety awareness;Decreased knowledge of use of DME or AE;Decreased knowledge of precautions;Impaired sensation;Impaired tone;Impaired UE functional use   OT Treatment/Interventions: Self-care/ADL training;Therapeutic exercise;Neuromuscular education;DME and/or AE instruction;Manual therapy;Splinting;Therapeutic activities;Cognitive remediation/compensation;Patient/family education;Balance training    OT Goals(Current goals can be  found in the care plan section) Acute Rehab OT Goals Time For Goal Achievement: 03/05/15 Potential to Achieve Goals: Fair ADL Goals Pt Will Perform Grooming: with adaptive equipment;sitting;with min assist Pt/caregiver will Perform Home Exercise Program: Increased ROM;Left upper extremity;With written HEP provided;Right Upper extremity Additional ADL Goal #1: Caregiver will be independent in positioning of LUE/RUE.  OT Frequency: Min 2X/week   Barriers to D/C:    Pt reports daughter lives with her and does not work. Pt reports she is by herself sometimes. Pt reports nurse aide to assist with bathing/dressing.        Co-evaluation              End of Session Nurse Communication: Mobility status  Activity Tolerance: Patient tolerated treatment well;Patient limited by fatigue Patient left: in bed;with call bell/phone within reach;with bed alarm set   Time: 2330-0762 OT Time Calculation (min): 33 min Charges:  OT General Charges $OT Visit: 1 Procedure OT Evaluation $Initial OT Evaluation Tier I: 1 Procedure OT Treatments $Self Care/Home Management : 8-22 mins G-Codes: OT G-codes **NOT FOR INPATIENT CLASS** Functional Assessment Tool Used: clinical judgment Functional Limitation: Self care Self Care Current Status (U6333): At least 80 percent but less than 100 percent impaired, limited or restricted Self Care Goal Status (L4562): At least 60 percent but less than 80 percent impaired, limited or restricted  Hortencia Pilar 02/19/2015, 10:33 AM

## 2015-02-19 NOTE — Evaluation (Signed)
Physical Therapy Evaluation Patient Details Name: [name] MRN: [id number] DOB: June 25, 1921 Today's Date: 02/19/2015   History of Present Illness  Pt is a 81 year old with a history of arthritis, breast cancer, retention, diabetes. The patient is evaluated for right facial droop, slurred speech, and mild confusion. MRI/MRA without contrast revealed no acute stroke.   Clinical Impression  Patient demonstrates deficits in functional mobility as indicated below. Will need continued skilled PT to address deficits and maximize function. Will see as indicated and progress as tolerated. Spoke with patient and family at length. Desire for patient is to return home. Given patient current functional status and decline in mobility, feel patient will required increased assist and assistive equipment to maintain stability and safety in home environment. Recommendations below.    Follow Up Recommendations Home health PT;Supervision/Assistance - 24 hour    Equipment Recommendations  Rolling walker with 5" wheels;3in1 (PT);Hospital bed;Other (comment) (hoyer lift)    Recommendations for Other Services       Precautions / Restrictions Precautions Precautions: Fall Restrictions Weight Bearing Restrictions: No      Mobility  Bed Mobility Overal bed mobility: Needs Assistance Bed Mobility: Rolling;Supine to Sit;Sit to Supine Rolling: Max assist   Supine to sit: Total assist;+2 for physical assistance Sit to supine: Max assist;+2 for physical assistance   General bed mobility comments: Total A for supine to EOB with HOB elevated  Transfers Overall transfer level: Needs assistance Equipment used: Rolling walker (2 wheeled) Transfers: Sit to/from Stand Sit to Stand: Max assist;+2 physical assistance         General transfer comment: +2 max assist for slevation to upright, manual assist to place RUE on RW, patient able to maintain standing with assist ~20 seconds. Performed x3, limited by  fatigue  Ambulation/Gait                Stairs            Wheelchair Mobility    Modified Rankin (Stroke Patients Only)       Balance Overall balance assessment: Needs assistance Sitting-balance support: Feet supported Sitting balance-Leahy Scale: Poor Sitting balance - Comments:  (required physical assist to maintain balance)                                     Pertinent Vitals/Pain Pain Assessment: No/denies pain    Home Living Family/patient expects to be discharged to:: Private residence Living Arrangements: Children;Other relatives Available Help at Discharge: Family Type of Home: House Home Access: Other (comment) (Pt reports ramped entry.)     Home Layout: One level Home Equipment: Other (comment) Additional Comments: Limited data due to impaired cognition and no family present at evaluation.     Prior Function Level of Independence: Needs assistance   Gait / Transfers Assistance Needed: w/c level with assist, walker for standing  ADL's / Homemaking Assistance Needed: Pt lives with daughter and reports she has a nurse come to the home to assist with bathing/dressing. Pt reports nurse does, "all of it (bathing/dressing)."        Hand Dominance        Extremity/Trunk Assessment     RUE Deficits / Details: Spasticity noted in RUE, Modified Ashworth 2/5; spasticity in digits is progressively worse from 1st to 5th digits.      LUE Deficits / Details: grossly 3/5 elbow and distal, 2+/5 at shoulder level; thumb in adducted/flexed position  Lower Extremity Assessment: Generalized weakness (+ arthritic changes noted, decreased ROM and sensation)         Communication   Communication: HOH;No difficulties  Cognition Arousal/Alertness: Awake/alert Behavior During Therapy: WFL for tasks assessed/performed Overall Cognitive Status: History of cognitive impairments - at baseline       Memory: Decreased short-term  memory;Decreased recall of precautions              General Comments General comments (skin integrity, edema, etc.): hygiene and pericare performed    Exercises        Assessment/Plan    PT Assessment Patient needs continued PT services  PT Diagnosis Difficulty walking;Abnormality of gait;Generalized weakness   PT Problem List Decreased strength;Decreased range of motion;Decreased activity tolerance;Decreased balance;Decreased mobility;Decreased coordination;Decreased cognition  PT Treatment Interventions DME instruction;Gait training;Functional mobility training;Therapeutic activities;Therapeutic exercise;Balance training;Patient/family education   PT Goals (Current goals can be found in the Care Plan section) Acute Rehab PT Goals Patient Stated Goal: to go home PT Goal Formulation: With patient/family Time For Goal Achievement: 03/05/15 Potential to Achieve Goals: Fair    Frequency Min 3X/week   Barriers to discharge        Co-evaluation               End of Session Equipment Utilized During Treatment: Gait belt Activity Tolerance: Patient limited by fatigue Patient left: in bed;with call bell/phone within reach;with bed alarm set;with family/visitor present Nurse Communication: Mobility status         Time: 0388-8280 PT Time Calculation (min) (ACUTE ONLY): 23 min   Charges:   PT Evaluation $Initial PT Evaluation Tier I: 1 Procedure PT Treatments $Therapeutic Activity: 8-22 mins   PT G CodesDuncan Dull 2015-02-21, 5:49 PM Alben Deeds, [place] DPT  [phone]

## 2015-02-19 NOTE — Progress Notes (Signed)
Nutrition Brief Note  Patient identified on the Malnutrition Screening Tool (MST) Report.  Wt Readings from Last 15 Encounters:  02/18/15 150 lb 2.1 oz (68.1 kg)  02/10/15 153 lb (69.4 kg)  01/29/15 153 lb (69.4 kg)  01/07/15 152 lb (68.947 kg)  01/01/15 155 lb (70.308 kg)    Body mass index is 26.6 kg/(m^2). Patient meets criteria for Overweight based on current BMI.   Current diet order is Heart Healthy/Carbohydrate Modified.  Reports a good appetite.  Labs and medications reviewed.   No nutrition interventions warranted at this time. If nutrition issues arise, please consult RD.   Arthur Holms, RD, LDN Pager #: [phone] After-Hours Pager #: [phone]

## 2015-02-19 NOTE — Progress Notes (Signed)
VASCULAR LAB PRELIMINARY  PRELIMINARY  PRELIMINARY  PRELIMINARY  Carotid duplex completed.    Preliminary report:  Technically difficult due to high bifurcations and anatomy of the neck.  Bilateral:  1-39% ICA stenosis.  Vertebral artery flow is antegrade.     [name], RVS 02/19/2015, 10:59 AM

## 2015-02-19 NOTE — Progress Notes (Addendum)
STROKE TEAM PROGRESS NOTE   SUBJECTIVE (INTERVAL HISTORY) Her daughter is at the bedside.  Overall she feels her condition is completely resolved. She is at her baseline now with right arm weakness due to previous neck surgery and b/l LE weakness wheelchair bound at home. She has some cognitive impairment at home.    OBJECTIVE Temp:  [97.3 F (36.3 C)-98.8 F (37.1 C)] 98.6 F (37 C) (09/07 0800) Pulse Rate:  [59-71] 71 (09/07 0800) Cardiac Rhythm:  [-] Normal sinus rhythm (09/07 0700) Resp:  [14-22] 18 (09/07 0800) BP: (96-207)/(41-56) 168/55 mmHg (09/07 0800) SpO2:  [97 %-100 %] 100 % (09/07 0800) Weight:  [68.1 kg (150 lb 2.1 oz)] 68.1 kg (150 lb 2.1 oz) (09/06 2011)  CBC:  Recent Labs Lab 02/18/15 1117  WBC 8.0  NEUTROABS 4.4  HGB 11.3*  HCT 35.2*  MCV 94.4  PLT 185    Basic Metabolic Panel:  Recent Labs Lab 02/18/15 1117  NA 139  K 4.1  CL 107  CO2 27  GLUCOSE 247*  BUN 21*  CREATININE 1.05*  CALCIUM 9.5    Lipid Panel:    Component Value Date/Time   CHOL 84 02/19/2015 0550   TRIG 88 02/19/2015 0550   HDL 27* 02/19/2015 0550   CHOLHDL 3.1 02/19/2015 0550   VLDL 18 02/19/2015 0550   LDLCALC 39 02/19/2015 0550   HgbA1c:  Lab Results  Component Value Date   HGBA1C 6.4* 01/02/2015   Urine Drug Screen: No results found for: LABOPIA, COCAINSCRNUR, LABBENZ, AMPHETMU, THCU, LABBARB    IMAGING I have personally reviewed the radiological images below and agree with the radiology interpretations.  Dg Chest 2 View  02/18/2015   CLINICAL DATA:  TIA, no chest complaints, headache and dizziness  EXAM: CHEST  2 VIEW  COMPARISON:  None.  FINDINGS: Suboptimal positioning. Mild cardiac enlargement. Status post prior CABG. Aortic atherosclerosis. No consolidation or effusion. 5 mm probable granuloma right middle lobe.  IMPRESSION: No significant acute findings.  Mild cardiac enlargement.   Ct Head Wo Contrast  02/18/2015   CLINICAL DATA:  Right facial droop with  slurred speech and sudden onset confusion  EXAM: CT HEAD WITHOUT CONTRAST  TECHNIQUE: Contiguous axial images were obtained from the base of the skull through the vertex without intravenous contrast.  COMPARISON:  None.  FINDINGS: There is moderate diffuse atrophy. There is no intracranial mass, hemorrhage, extra-axial fluid collection, or midline shift. There is patchy small vessel disease in the centra semiovale bilaterally. There is an age uncertain small infarct in the periphery of the midportion left frontal lobe superiorly. Gray-white compartments elsewhere appear normal. Bony calvarium appears intact. Mastoid air cells are clear.  IMPRESSION: Age uncertain small infarct superior midportion left frontal lobe. There is atrophy with periventricular small vessel disease. No hemorrhage or mass effect.     Mri and Mra Head Wo Contrast  02/18/2015    No acute stroke is evident.  Moderately advanced chronic microvascular ischemic change with generalized atrophy.  6 mm Subpial cyst or remote lacunar infarct RIGHT paramedian upper pons, doubtful significance.  Moderate irregularity throughout the intracranial vasculature consistent with intracranial atherosclerotic disease, without flow-limiting stenosis or occlusion.    CUS - Bilateral: 1-39% ICA stenosis. Vertebral artery flow is antegrade.  2D echo - - Left ventricle: The cavity size was normal. There was severe focal basal hypertrophy of the septum. Systolic function was normal. The estimated ejection fraction was in the range of 55% to 60%. Wall motion was  normal; there were no regional wall motion abnormalities. Doppler parameters are consistent with abnormal left ventricular relaxation (grade 1 diastolic dysfunction). Doppler parameters are consistent with elevated ventricular end-diastolic filling pressure. - Aortic valve: Trileaflet; normal thickness leaflets. There was no regurgitation. - Aortic root: The aortic root was normal  in size. - Mitral valve: Calcified annulus. Moderately thickened, moderately calcified leaflets . - Right ventricle: The cavity size was normal. Wall thickness was normal. Systolic function was normal. - Right atrium: The atrium was normal in size. - Tricuspid valve: There was trivial regurgitation. - Pulmonic valve: There was no regurgitation. - Pulmonary arteries: Systolic pressure was within the normal range. - Inferior vena cava: The vessel was normal in size. - Pericardium, extracardiac: There was no pericardial effusion.   PHYSICAL EXAM  Temp:  [97.3 F (36.3 C)-98.8 F (37.1 C)] 98.3 F (36.8 C) (09/07 1409) Pulse Rate:  [59-71] 69 (09/07 1409) Resp:  [14-22] 20 (09/07 1409) BP: (96-207)/(41-60) 193/60 mmHg (09/07 1409) SpO2:  [97 %-100 %] 97 % (09/07 1409) Weight:  [150 lb 2.1 oz (68.1 kg)] 150 lb 2.1 oz (68.1 kg) (09/06 2011)  General - Well nourished, well developed, in no apparent distress.  Ophthalmologic - Fundi not visualized due to noncooperation.  Cardiovascular - Regular rate and rhythm.  Mental Status -  Level of arousal and orientation to self, and person were intact, but not orientated to place and time. Language testing showed paucity of speech, name 2/4 and repeat 1/2, able to following simple commands Fund of Knowledge was assessed and was impaired, able to count 2/5 presidents.   Cranial Nerves II - XII - II - Visual field intact OU. III, IV, VI - Extraocular movements intact. V - Facial sensation intact bilaterally. VII - Facial movement intact bilaterally. VIII - Hearing & vestibular intact bilaterally. X - Palate elevates symmetrically. XI - Chin turning & shoulder shrug intact bilaterally. XII - Tongue protrusion intact.  Motor Strength - The patient's strength test showed LUE 5/5, RUE 3+/5 proximal and 4/5 bicep and tricep and 3/5 hand grip, BLE 2/5 proximal and 3/5 distally and pronator drift was present on the right.  Bulk was normal  and fasciculations were absent.   Motor Tone - Muscle tone was assessed at the neck and appendages and was normal.  Reflexes - The patient's reflexes were 1+ in all extremities and she had no pathological reflexes.  Sensory - Light touch, temperature/pinprick were assessed and were symmetrical.    Coordination - The patient had normal movements in the hands and feet with no ataxia or dysmetria.  Tremor was absent.  Gait and Station - defer to PT which is at room.   ASSESSMENT/PLAN Ms. [name] is a 81 y.o. female with history of arthritis, breast cancer status post mastectomy on chronic exemestane, hypertension, diabetes on insulin, dyslipidemia, and DVT presenting with transient right facial droop, confusion and drooling.She did not receive IV t-PA due to delay in arrival with resolved symptoms.   TIA:  Left hemisphere TIA, likely due to small vessel disease source with multiple stroke risk factors  Resultant  No acute deficit  MRI  No acute stroke but old pontine and b/l BG lacunar strokes  MRA  Diffuse intracranial stensosis  Carotid Doppler  unremarkable  2D Echo  EF 55-60%  LDL 39  HgbA1c pending  lovenox for VTE prophylaxis  Diet heart healthy/carb modified Room service appropriate?: Yes; Fluid consistency:: Thin  aspirin 81 mg orally every day prior to  admission, now on aspirin 325 mg orally every day. Recommend plavix for stroke prevention (ordered).  Patient counseled to be compliant with her antithrombotic medications  Ongoing aggressive stroke risk factor management  Therapy recommendations:  pending  Disposition:  pending  Hypertension  Unstable, on the high side Permissive hypertension (OK if < 220/120) but gradually normalize in 5-7 days  Hyperlipidemia  Home meds:  lipitor 40 resumed in hospital  LDL 39, goal < 70  Continue statin at discharge  Diabetes  HgbA1c pending, goal < 7.0  Uncontrolled  SSI   CBG monitoring  Other Stroke  Risk Factors  Advanced age  Obesity  Hx stroke/TIA  Other Active Problems  UTI - on rocephine  Right breast cancer s/p surgery - cured  Left breast cancer - stable follow up with oncology  Hospital day # 1  Neurology will sign off. Please call with questions. Pt will follow up with Dr. Erlinda Hong at [facility] in about 2 months. Thanks for the consult.   [name] Hawking, MD PhD Stroke Neurology 02/19/2015 2:35 PM      To contact Stroke Continuity provider, please refer to [email or web address] After hours, contact General Neurology

## 2015-02-20 ENCOUNTER — Encounter: Payer: Self-pay | Admitting: Family Medicine

## 2015-02-20 DIAGNOSIS — E119 Type 2 diabetes mellitus without complications: Secondary | ICD-10-CM | POA: Diagnosis not present

## 2015-02-20 DIAGNOSIS — E785 Hyperlipidemia, unspecified: Secondary | ICD-10-CM | POA: Diagnosis not present

## 2015-02-20 DIAGNOSIS — G459 Transient cerebral ischemic attack, unspecified: Secondary | ICD-10-CM | POA: Diagnosis not present

## 2015-02-20 DIAGNOSIS — I10 Essential (primary) hypertension: Secondary | ICD-10-CM | POA: Diagnosis not present

## 2015-02-20 LAB — GLUCOSE, CAPILLARY
GLUCOSE-CAPILLARY: 100 mg/dL — AB (ref 65–99)
Glucose-Capillary: 164 mg/dL — ABNORMAL HIGH (ref 65–99)

## 2015-02-20 LAB — HEMOGLOBIN A1C
HEMOGLOBIN A1C: 7.4 % — AB (ref 4.8–5.6)
MEAN PLASMA GLUCOSE: 166 mg/dL

## 2015-02-20 MED ORDER — AMLODIPINE BESYLATE 5 MG PO TABS: 5.0000 mg | ORAL_TABLET | Freq: Every day | ORAL | Status: DC

## 2015-02-20 MED ORDER — CLOPIDOGREL BISULFATE 75 MG PO TABS: 75.0000 mg | ORAL_TABLET | Freq: Every day | ORAL | Status: DC

## 2015-02-20 MED ORDER — PNEUMOCOCCAL VAC POLYVALENT 25 MCG/0.5ML IJ INJ
0.5000 mL | INJECTION | Freq: Once | INTRAMUSCULAR | Status: AC
  Administered 2015-02-20: 0.5 mL via INTRAMUSCULAR
  Filled 2015-02-20: qty 0.5

## 2015-02-20 NOTE — Progress Notes (Signed)
1530 02/20/15 nursing  Patient discharged to home per wheelchair accompanied by NT,  RN and daughter. Patient is 2 assist. Discharge instructions given by RN no other questions noted.

## 2015-02-20 NOTE — Care Management Note (Signed)
Case Management Note  Patient Details  Name: [name] MRN: [id number] Date of Birth: 1922/05/08  Subjective/Objective:                    Action/Plan: CM spoke with patient and daughter Everlene Farrier [phone]) at the bedside about discharge plans. Everlene Farrier presented with [place] list and she chose [place]. Sheppard Evens for [place] notified and she accepted the referral. Pt also ordered DME: 3 in 1/ hospital bed/ rolling walker/ hoyer lift. [facility] notified of DME and equipment to be delivered to patients house. Home address and telephone numbers verified with Everlene Farrier and someone will be home for delivery.   Expected Discharge Date:                  Expected Discharge Plan:  [place]  In-House Referral:     Discharge planning Services  CM Consult  Post Acute Care Choice:    Choice offered to:  Adult Children, Patient  DME Arranged:  3-N-1, Hospital bed, Walker rolling (hoyer lift) DME Agency:  [place] Arranged:  RN, PT, OT, Nurse's Aide [place] Agency:  [place]  Status of Service:  Completed, signed off  Medicare Important Message Given:    Date Medicare IM Given:    Medicare IM give by:    Date Additional Medicare IM Given:    Additional Medicare Important Message give by:     If discussed at [place] of Stay Meetings, dates discussed:    Additional Comments:  Ollen Gross, RN 02/20/2015, 1:51 PM

## 2015-02-20 NOTE — Discharge Summary (Addendum)
PATIENT DETAILS Name: [name] Age: 81 y.o. Sex: female Date of Birth: 1922/05/03 MRN: [id number]. Admitting Physician: Truett Mainland, DO JOI:NOMVE [name] Axon, DO  Admit Date: 02/18/2015 Discharge date: 02/20/2015  Recommendations for Outpatient Follow-up:  1. Please repeat lipid panel and A1C in 3 months at next visit   PRIMARY DISCHARGE DIAGNOSIS:  Principal Problem:   TIA (transient ischemic attack) Active Problems:   Essential hypertension   Diabetes mellitus with insulin therapy   HLD (hyperlipidemia)   History of breast cancer   UTI (lower urinary tract infection)   CKD (chronic kidney disease), stage III   Oral thrush      PAST MEDICAL HISTORY: Past Medical History  Diagnosis Date  . Hyperlipidemia   . Hypertension   . History of DVT (deep vein thrombosis) 1980's    BLE  . Type II diabetes mellitus     Insulin dependent.   . Decubitus ulcer, buttock   . TIA (transient ischemic attack) 02/18/2015  . Arthritis     "all over"  . Breast cancer     Hx of R breast cancer s/p mastectomy; Currently has L breast cancer (Diagnosed 2015).   . Glaucoma, bilateral     DISCHARGE MEDICATIONS: Current Discharge Medication List    START taking these medications   Details  amLODipine (NORVASC) 5 MG tablet Take 1 tablet (5 mg total) by mouth daily. Qty: 60 tablet, Refills: 0    clopidogrel (PLAVIX) 75 MG tablet Take 1 tablet (75 mg total) by mouth daily. Qty: 60 tablet, Refills: 0      CONTINUE these medications which have NOT CHANGED   Details  atorvastatin (LIPITOR) 40 MG tablet Take 40 mg by mouth daily at 6 PM.     bimatoprost (LUMIGAN) 0.01 % SOLN Place 1 drop into both eyes at bedtime.    brimonidine (ALPHAGAN P) 0.1 % SOLN Place 1 drop into both eyes 2 (two) times daily. Qty: 10 mL, Refills: 0    Calcium Carb-Cholecalciferol (CALCIUM 600 + D PO) Take 1 tablet by mouth daily.     Cholecalciferol (VITAMIN D3) 1000 UNITS CAPS Take 1 capsule by mouth daily.     clonazePAM (KLONOPIN) 0.5 MG tablet Take 0.5 mg by mouth at bedtime.     CRANBERRY PO Take by mouth.    exemestane (AROMASIN) 25 MG tablet Take 1 tablet (25 mg total) by mouth daily after breakfast. Qty: 30 tablet, Refills: 6   Associated Diagnoses: Breast cancer, left breast    hydrALAZINE (APRESOLINE) 50 MG tablet Take 1 tablet (50 mg total) by mouth 4 (four) times daily. Qty: 30 tablet, Refills: 6    !! insulin aspart protamine - aspart (NOVOLOG MIX 70/30 FLEXPEN) (70-30) 100 UNIT/ML FlexPen Inject 50 units in the morning and 15 units in the evening Qty: 15 mL, Refills: 11    lisinopril (PRINIVIL,ZESTRIL) 20 MG tablet Take 20 mg by mouth 2 (two) times daily.    meloxicam (MOBIC) 15 MG tablet Take 15 mg by mouth daily.    metoprolol succinate (TOPROL-XL) 50 MG 24 hr tablet Take 50 mg by mouth 2 (two) times daily.    Multiple Vitamins-Minerals (MULTIVITAMIN GUMMIES ADULT PO) Take 1 tablet by mouth daily.     omeprazole (PRILOSEC) 20 MG capsule Take 1 capsule (20 mg total) by mouth daily. Qty: 90 capsule, Refills: 1    Fluocinolone Acetonide 0.01 % SHAM Apply no more than 1 ounce to scalp once daily; work into [employer] and allow to remain  on scalp for ~5 minutes. Remove from hair and scalp by rinsing thoroughly with water. Qty: 120 mL, Refills: 0    !! insulin aspart protamine - aspart (NOVOLOG MIX 70/30 FLEXPEN) (70-30) 100 UNIT/ML FlexPen Inject 0.35 mLs (35 Units total) into the skin 2 (two) times daily. Qty: 15 mL, Refills: 11     !! - Potential duplicate medications found. Please discuss with provider.    STOP taking these medications     aspirin 81 MG tablet      FLUOCINOLONE ACETONIDE SCALP 0.01 % OIL      cephALEXin (KEFLEX) 250 MG capsule      metoprolol (LOPRESSOR) 50 MG tablet         ALLERGIES:  No Known Allergies  BRIEF HPI:  See H&P, Labs, Consult and Test reports for all details in brief, patient is a 81 year old female with history of breast cancer,  hypertension and diabetes who was admitted with transient right facial droop and slurred speech.  CONSULTATIONS:   neurology  PERTINENT RADIOLOGIC STUDIES: Dg Chest 2 View  02/18/2015   CLINICAL DATA:  TIA, no chest complaints, headache and dizziness  EXAM: CHEST  2 VIEW  COMPARISON:  None.  FINDINGS: Suboptimal positioning. Mild cardiac enlargement. Status post prior CABG. Aortic atherosclerosis. No consolidation or effusion. 5 mm probable granuloma right middle lobe.  IMPRESSION: No significant acute findings.  Mild cardiac enlargement.   Electronically Signed   By: Skipper Cliche M.D.   On: 02/18/2015 19:39   Ct Head Wo Contrast  02/18/2015   CLINICAL DATA:  Right facial droop with slurred speech and sudden onset confusion  EXAM: CT HEAD WITHOUT CONTRAST  TECHNIQUE: Contiguous axial images were obtained from the base of the skull through the vertex without intravenous contrast.  COMPARISON:  None.  FINDINGS: There is moderate diffuse atrophy. There is no intracranial mass, hemorrhage, extra-axial fluid collection, or midline shift. There is patchy small vessel disease in the centra semiovale bilaterally. There is an age uncertain small infarct in the periphery of the midportion left frontal lobe superiorly. Gray-white compartments elsewhere appear normal. Bony calvarium appears intact. Mastoid air cells are clear.  IMPRESSION: Age uncertain small infarct superior midportion left frontal lobe. There is atrophy with periventricular small vessel disease. No hemorrhage or mass effect.   Electronically Signed   By: Lowella Grip III M.D.   On: 02/18/2015 13:38   Mr Jodene Nam Head Wo Contrast  02/18/2015   CLINICAL DATA:  Multiple episodes of Transient RIGHT facial droop which resolved. Dementia.  EXAM: MRI HEAD WITHOUT CONTRAST  MRA HEAD WITHOUT CONTRAST  TECHNIQUE: Multiplanar, multiecho pulse sequences of the brain and surrounding structures were obtained without intravenous contrast. Angiographic images of  the head were obtained using MRA technique without contrast.  COMPARISON:  CT head 02/18/2015.  FINDINGS: MRI HEAD FINDINGS  No evidence for acute infarction, hemorrhage, mass lesion, hydrocephalus, or extra-axial fluid. Generalized atrophy. Chronic microvascular ischemic change of a moderate to severe nature. 6 mm Subpial cyst or remote lacunar infarct RIGHT paramedian upper pons, doubtful significance. Remote lacunar infarcts affect the deep nuclei, most notable LEFT thalamus. LEFT cataract extraction. Severe cervical spondylosis. Extracranial soft tissues demonstrate BILATERAL mastoid effusions of uncertain significance.  MRA HEAD FINDINGS  Estimated 50% stenosis of the inferior cavernous segment RIGHT ICA. No supraclinoid disease. Mild irregularity of the cavernous and supraclinoid LEFT ICA without a focal area of narrowing.  Mild irregularity proximal basilar artery. Mild irregularity both distal vertebral arteries. Focal 50% stenosis  distal LEFT vertebral. Dolichoectasia of both proximal middle cerebral arteries without focal narrowing. 50% stenosis of non-dominant LEFT A1 ACA. No RIGHT A1 ACA disease. No LEFT PCA disease. 50% stenosis RIGHT P1 PCA. No cerebellar branch occlusion. Moderately diseased LEFT PICA. No intracranial aneurysm.  The area of concern on CT represents partial volume averaging of a sulcus in this patient with atrophic change.  IMPRESSION: No acute stroke is evident.  Moderately advanced chronic microvascular ischemic change with generalized atrophy.  6 mm Subpial cyst or remote lacunar infarct RIGHT paramedian upper pons, doubtful significance.  Moderate irregularity throughout the intracranial vasculature consistent with intracranial atherosclerotic disease, without flow-limiting stenosis or occlusion.   Electronically Signed   By: Staci Righter M.D.   On: 02/18/2015 19:21   Mr Brain Wo Contrast  02/18/2015   CLINICAL DATA:  Multiple episodes of Transient RIGHT facial droop which  resolved. Dementia.  EXAM: MRI HEAD WITHOUT CONTRAST  MRA HEAD WITHOUT CONTRAST  TECHNIQUE: Multiplanar, multiecho pulse sequences of the brain and surrounding structures were obtained without intravenous contrast. Angiographic images of the head were obtained using MRA technique without contrast.  COMPARISON:  CT head 02/18/2015.  FINDINGS: MRI HEAD FINDINGS  No evidence for acute infarction, hemorrhage, mass lesion, hydrocephalus, or extra-axial fluid. Generalized atrophy. Chronic microvascular ischemic change of a moderate to severe nature. 6 mm Subpial cyst or remote lacunar infarct RIGHT paramedian upper pons, doubtful significance. Remote lacunar infarcts affect the deep nuclei, most notable LEFT thalamus. LEFT cataract extraction. Severe cervical spondylosis. Extracranial soft tissues demonstrate BILATERAL mastoid effusions of uncertain significance.  MRA HEAD FINDINGS  Estimated 50% stenosis of the inferior cavernous segment RIGHT ICA. No supraclinoid disease. Mild irregularity of the cavernous and supraclinoid LEFT ICA without a focal area of narrowing.  Mild irregularity proximal basilar artery. Mild irregularity both distal vertebral arteries. Focal 50% stenosis distal LEFT vertebral. Dolichoectasia of both proximal middle cerebral arteries without focal narrowing. 50% stenosis of non-dominant LEFT A1 ACA. No RIGHT A1 ACA disease. No LEFT PCA disease. 50% stenosis RIGHT P1 PCA. No cerebellar branch occlusion. Moderately diseased LEFT PICA. No intracranial aneurysm.  The area of concern on CT represents partial volume averaging of a sulcus in this patient with atrophic change.  IMPRESSION: No acute stroke is evident.  Moderately advanced chronic microvascular ischemic change with generalized atrophy.  6 mm Subpial cyst or remote lacunar infarct RIGHT paramedian upper pons, doubtful significance.  Moderate irregularity throughout the intracranial vasculature consistent with intracranial atherosclerotic  disease, without flow-limiting stenosis or occlusion.   Electronically Signed   By: Staci Righter M.D.   On: 02/18/2015 19:21     PERTINENT LAB RESULTS: CBC:  Recent Labs  02/18/15 1117  WBC 8.0  HGB 11.3*  HCT 35.2*  PLT 338   CMET CMP     Component Value Date/Time   NA 139 02/18/2015 1117   NA 145 03/01/2012 1835   K 4.1 02/18/2015 1117   K 5.6* 03/01/2012 1835   CL 107 02/18/2015 1117   CL 112* 03/01/2012 1835   CO2 27 02/18/2015 1117   CO2 27 03/01/2012 1835   GLUCOSE 247* 02/18/2015 1117   GLUCOSE 72 03/01/2012 1835   BUN 21* 02/18/2015 1117   BUN 25* 03/01/2012 1835   CREATININE 1.05* 02/18/2015 1117   CREATININE 1.06 03/01/2012 1835   CALCIUM 9.5 02/18/2015 1117   CALCIUM 9.2 03/01/2012 1835   PROT 5.2* 02/18/2015 1117   PROT 6.7 03/01/2012 1835   ALBUMIN 2.6* 02/18/2015  1117   ALBUMIN 2.8* 03/01/2012 1835   AST 22 02/18/2015 1117   AST 42* 03/01/2012 1835   ALT 21 02/18/2015 1117   ALT 18 03/01/2012 1835   ALKPHOS 60 02/18/2015 1117   ALKPHOS 52 03/01/2012 1835   BILITOT 0.6 02/18/2015 1117   BILITOT 0.3 03/01/2012 1835   GFRNONAA 45* 02/18/2015 1117   GFRNONAA 47* 03/01/2012 1835   GFRAA 52* 02/18/2015 1117   GFRAA 54* 03/01/2012 1835    GFR Estimated Creatinine Clearance: 32.3 mL/min (by C-G formula based on Cr of 1.05). No results for input(s): LIPASE, AMYLASE in the last 72 hours. No results for input(s): CKTOTAL, CKMB, CKMBINDEX, TROPONINI in the last 72 hours. Invalid input(s): POCBNP No results for input(s): DDIMER in the last 72 hours.  Recent Labs  02/19/15 0550  HGBA1C 7.4*    Recent Labs  02/19/15 0550  CHOL 84  HDL 27*  LDLCALC 39  TRIG 88  CHOLHDL 3.1   No results for input(s): TSH, T4TOTAL, T3FREE, THYROIDAB in the last 72 hours.  Invalid input(s): FREET3 No results for input(s): VITAMINB12, FOLATE, FERRITIN, TIBC, IRON, RETICCTPCT in the last 72 hours. Coags: No results for input(s): INR in the last 72  hours.  Invalid input(s): PT Microbiology: No results found for this or any previous visit (from the past 240 hour(s)).   BRIEF HOSPITAL COURSE:   Principal Problem: TIA (transient ischemic attack):Patient presented to ED with stroke-like symptoms-which completely resolved and have not recurred since. CT/MRI no acute infarct. Carotid Doppler negative for significant stenosis, 2-D echocardiogram did not show any embolic foci, ejection fraction was preserved. A1c was elevated at 7.4-she will continue with current insulin regimen. LDL was at 39-continue statin. No neurological deficits on exam, seen by neurology, recommendations are to switch to Plavix. She is being discharged home with home health services.  Active Problems: UTI: Completed 3 days of IV Rocephin, does not need any further antibiotics treatment.  Essential hypertension: Fluctuating but mostly uncontrolled-continue with Toprol, hydralazine and lisinopril. We'll add amlodipine. Further monitoring/optimization to be done in the outpatient setting.  Diabetes mellitus with insulin therapy: Continue with insulin 70/30. Further optimization deferred to the outpatient setting  HLD (hyperlipidemia): Continue with statin  TODAY-DAY OF DISCHARGE:  Subjective:   [name] today has no headache,no chest abdominal pain,no new weakness tingling or numbness, feels much better wants to go home today.   Objective:   Blood pressure 168/66, pulse 70, temperature 98.2 F (36.8 C), temperature source Oral, resp. rate 16, height 5\' 3"  (1.6 m), weight 68.1 kg (150 lb 2.1 oz), SpO2 99 %. No intake or output data in the 24 hours ending 02/20/15 0959 Filed Weights   02/18/15 2011  Weight: 68.1 kg (150 lb 2.1 oz)    Exam Awake Alert, Oriented *3, No new F.N deficits, Normal affect [place].AT,PERRAL Supple Neck,No JVD, No cervical lymphadenopathy appriciated.  Symmetrical Chest wall movement, Good air movement bilaterally, CTAB RRR,No  Gallops,Rubs or new Murmurs, No Parasternal Heave +ve B.Sounds, Abd Soft, Non tender, No organomegaly appriciated, No rebound -guarding or rigidity. No Cyanosis, Clubbing or edema, No new Rash or bruise  DISCHARGE CONDITION: Stable  DISPOSITION: Home with home health services  DISCHARGE INSTRUCTIONS:    Activity:  As tolerated with Full fall precautions use walker/cane & assistance as needed  Get Medicines reviewed and adjusted: Please take all your medications with you for your next visit with your Primary MD  Please request your Primary MD to go over all  hospital tests and procedure/radiological results at the follow up, please ask your Primary MD to get all Hospital records sent to his/her office.  If you experience worsening of your admission symptoms, develop shortness of breath, life threatening emergency, suicidal or homicidal thoughts you must seek medical attention immediately by calling 911 or calling your MD immediately  if symptoms less severe.  You must read complete instructions/literature along with all the possible adverse reactions/side effects for all the Medicines you take and that have been prescribed to you. Take any new Medicines after you have completely understood and accpet all the possible adverse reactions/side effects.   Do not drive when taking Pain medications.   Do not take more than prescribed Pain, Sleep and Anxiety Medications  Special Instructions: If you have smoked or chewed Tobacco  in the last 2 yrs please stop smoking, stop any regular Alcohol  and or any Recreational drug use.  Wear Seat belts while driving.  Please note  You were cared for by a hospitalist during your hospital stay. Once you are discharged, your primary care physician will handle any further medical issues. Please note that NO REFILLS for any discharge medications will be authorized once you are discharged, as it is imperative that you return to your primary care physician  (or establish a relationship with a primary care physician if you do not have one) for your aftercare needs so that they can reassess your need for medications and monitor your lab values.   Diet recommendation: Diabetic Diet Heart Healthy diet  Discharge Instructions    Call MD for:  extreme fatigue    Complete by:  As directed      Call MD for:  persistant dizziness or light-headedness    Complete by:  As directed      Diet - low sodium heart healthy    Complete by:  As directed      Increase activity slowly    Complete by:  As directed             Total Time spent on discharge equals 25  minutes.  SignedOren Binet 02/20/2015 9:59 AM

## 2015-02-24 ENCOUNTER — Telehealth: Payer: Self-pay

## 2015-02-24 NOTE — Telephone Encounter (Signed)
Patient's daughter Everlene Farrier) requests Omeprazole dr 20mg  be added to her mother's medication list and filled.  States, "She was on this medication and it somehow didn't make it on the list when she moved".

## 2015-02-24 NOTE — Telephone Encounter (Signed)
Transition Care Management Follow-up Telephone Call   Date discharged?02/20/15  How have you been since you were released from the hospital? 02/20/15  Do you understand why you were in the hospital? Yes   Do you understand the discharge instructions? Yes   Where were you discharged to? Home   Items Reviewed:  Medications reviewed: Yes  Allergies reviewed: Yes  Dietary changes reviewed: Yes       Referrals reviewed: Yes  Functional Questionnaire:   Activities of Daily Living (ADLs):   She states they are independent in the following: requires assistance with all ADLs States they require assistance with the following: requires assistance with all ADLs. Ambulates with walker and wheelchair.  Daughter helps.   Any transportation issues/concerns?: No   Any patient concerns? None   Confirmed importance and date/time of follow-up visits scheduled Yes. Upcoming appointments scheduled.   Provider Appointment booked with Dr. Lacinda Axon (PCP). Patient okay to keep previously scheduled appointment as HFU per Dr. Lacinda Axon.   Confirmed with patient if condition begins to worsen call PCP or go to the ER.  Patient was given the office number and encouraged to call back with question or concerns.  : Yes, daughter Everlene Farrier) verbalized understanding.

## 2015-02-25 ENCOUNTER — Telehealth: Payer: Self-pay

## 2015-02-25 ENCOUNTER — Other Ambulatory Visit: Payer: Self-pay | Admitting: Family Medicine

## 2015-02-25 MED ORDER — RANITIDINE HCL 150 MG PO CAPS: 150.0000 mg | ORAL_CAPSULE | Freq: Two times a day (BID) | ORAL | Status: DC

## 2015-02-25 NOTE — Telephone Encounter (Signed)
Sent in Rx for Zantac.

## 2015-02-25 NOTE — Telephone Encounter (Signed)
pts daughter was called and told the prescription for zantac was sent in.

## 2015-02-25 NOTE — Telephone Encounter (Signed)
Pts daughter was called and i told her we were going to stop the omeprazole due to the side effects due to her taking lasix.  I stated that i could put her on something different if the omeprazole was helping. She stated that she would like her mom to have something else that would be recommended by the doctor.

## 2015-02-25 NOTE — Telephone Encounter (Signed)
Not recommended with plavix. We can send in something else if she is having trouble with GERD.

## 2015-03-05 ENCOUNTER — Encounter: Payer: Self-pay | Admitting: Family Medicine

## 2015-03-06 ENCOUNTER — Ambulatory Visit (INDEPENDENT_AMBULATORY_CARE_PROVIDER_SITE_OTHER): Payer: Medicare PPO | Admitting: Family Medicine

## 2015-03-06 ENCOUNTER — Encounter: Payer: Self-pay | Admitting: Family Medicine

## 2015-03-06 VITALS — BP 104/66 | HR 93 | Temp 97.8°F | Ht 63.0 in

## 2015-03-06 DIAGNOSIS — N390 Urinary tract infection, site not specified: Secondary | ICD-10-CM

## 2015-03-06 HISTORY — DX: Urinary tract infection, site not specified: N39.0

## 2015-03-06 LAB — POCT URINALYSIS DIPSTICK
BILIRUBIN UA: NEGATIVE
GLUCOSE UA: NEGATIVE
KETONES UA: NEGATIVE
Nitrite, UA: NEGATIVE
Protein, UA: >=300
SPEC GRAV UA: 1.025
UROBILINOGEN UA: 0.2
pH, UA: 6.5

## 2015-03-06 MED ORDER — CIPROFLOXACIN HCL 500 MG PO TABS: 500.0000 mg | ORAL_TABLET | Freq: Two times a day (BID) | ORAL | Status: DC

## 2015-03-06 NOTE — Assessment & Plan Note (Addendum)
Urinalysis was obtained today revealed large leukocytes and trace blood. Consistent with UTI. Obtaining culture and treating with cipro.

## 2015-03-06 NOTE — Patient Instructions (Signed)
Follow up with me as scheduled.  I am setting up a referral to urology.  I will call in an antibiotic once I have your urine results.  Take care  Dr. Lacinda Axon

## 2015-03-06 NOTE — Progress Notes (Signed)
Pre visit review using our clinic review tool, if applicable. No additional management support is needed unless otherwise documented below in the visit note. 

## 2015-03-06 NOTE — Progress Notes (Signed)
   Subjective:  Patient ID: [name], female    DOB: 27-Mar-1922  Age: 81 y.o. MRN: [id number]  CC: Pain with urination  HPI: 81 year old female with HTN, DM-2, HLD, CKD presents with complaints of dysuria.  Patient has had 2 UTI's in that past month.  She presents today with complaints of burning on urination. No reports of urinary urgency or frequency. She states that this is been present for 3 days. No exacerbating or relieving factors. No fevers, chills, back pain.  Social Hx   Social History   Social History  . Marital Status: Widowed    Spouse Name: N/A  . Number of Children: N/A  . Years of Education: N/A   Social History Main Topics  . Smoking status: Never Smoker   . Smokeless tobacco: Never Used  . Alcohol Use: No  . Drug Use: No  . Sexual Activity: Not Asked   Other Topics Concern  . None   Social History Narrative   Lives with daughter/daughter's husband.   Denita Lung [phone]).    Review of Systems  Constitutional: Negative for fever and chills.  Genitourinary: Positive for dysuria.   Objective:  BP 104/66 mmHg  Pulse 93  Temp(Src) 97.8 F (36.6 C) (Oral)  Ht 5\' 3"  (1.6 m)  SpO2 99%  BP/Weight 03/06/2015 06/22/6220 02/19/9891  Systolic BP 119 417 -  Diastolic BP 66 66 -  Wt. (Lbs) - - 150.13  BMI - - 26.6   Physical Exam  Constitutional: She appears well-developed and well-nourished. No distress.  Cardiovascular: Normal rate and regular rhythm.   Pulmonary/Chest: Effort normal and breath sounds normal.  Abdominal: Soft. She exhibits no distension. There is no tenderness. There is no rebound and no guarding.  Neurological: She is alert.   Results for orders placed or performed in visit on 03/06/15 (from the past 24 hour(s))  POCT urinalysis dipstick     Status: Abnormal   Collection Time: 03/06/15 12:00 PM  Result Value Ref Range   Color, UA yellow    Clarity, UA clear    Glucose, UA neg    Bilirubin, UA neg    Ketones, UA neg    Spec Grav, UA 1.025    Blood, UA trace-lysed    pH, UA 6.5    Protein, UA >=300    Urobilinogen, UA 0.2    Nitrite, UA neg    Leukocytes, UA large (3+) (A) Negative    Assessment & Plan:   Problem List Items Addressed This Visit    Recurrent UTI - Primary    Urinalysis was obtained today revealed large leukocytes and trace blood. Consistent with UTI. Obtaining culture and treating with cipro.      Relevant Orders   Ambulatory referral to Urology   POCT urinalysis dipstick (Completed)   Urine culture     Follow-up: October  [name], DO

## 2015-03-07 ENCOUNTER — Telehealth: Payer: Self-pay | Admitting: Family Medicine

## 2015-03-07 ENCOUNTER — Other Ambulatory Visit: Payer: Self-pay | Admitting: Family Medicine

## 2015-03-07 NOTE — Telephone Encounter (Signed)
Please advise thrush and vaginal symptoms.

## 2015-03-07 NOTE — Telephone Encounter (Signed)
Debbie calling from [place] regarding pt has flush in her mouth and vaginal itching and burning. Something needs to be called in per Debbie.  Thank You!

## 2015-03-07 NOTE — Telephone Encounter (Signed)
Your pt

## 2015-03-08 LAB — URINE CULTURE

## 2015-03-09 ENCOUNTER — Other Ambulatory Visit: Payer: Self-pay | Admitting: Family Medicine

## 2015-03-09 MED ORDER — CLOTRIMAZOLE 2 % VA CREA: 1.0000 | TOPICAL_CREAM | Freq: Every day | VAGINAL | Status: DC

## 2015-03-09 MED ORDER — NYSTATIN 100000 UNIT/ML MT SUSP: 5.0000 mL | Freq: Four times a day (QID) | OROMUCOSAL | Status: DC

## 2015-03-09 NOTE — Telephone Encounter (Signed)
Rx's sent to the pharmacy. Sorry for the delay but this was at the end of the day on Friday.

## 2015-03-10 ENCOUNTER — Telehealth: Payer: Self-pay | Admitting: Family Medicine

## 2015-03-10 NOTE — Telephone Encounter (Signed)
Please advise 

## 2015-03-10 NOTE — Telephone Encounter (Signed)
[name] with [facility] [phone] called needing Dr Lacinda Axon to state on letter change to Progress Note and addressed to [place]. Thank You!

## 2015-03-10 NOTE — Telephone Encounter (Signed)
Pt daughter was called and told of the rx at the pharmacy. Also stated if any other question to give me a callback.

## 2015-03-11 NOTE — Telephone Encounter (Signed)
[name] at [facility] about [name]. He did not pick up and a message was left for him to callback.

## 2015-03-11 NOTE — Telephone Encounter (Signed)
What is he requesting?

## 2015-03-12 ENCOUNTER — Encounter: Payer: Self-pay | Admitting: Obstetrics and Gynecology

## 2015-03-12 ENCOUNTER — Ambulatory Visit (INDEPENDENT_AMBULATORY_CARE_PROVIDER_SITE_OTHER): Payer: Medicare PPO | Admitting: Obstetrics and Gynecology

## 2015-03-12 VITALS — BP 111/62 | HR 80 | Ht 63.0 in | Wt 152.0 lb

## 2015-03-12 DIAGNOSIS — R32 Unspecified urinary incontinence: Secondary | ICD-10-CM

## 2015-03-12 DIAGNOSIS — N952 Postmenopausal atrophic vaginitis: Secondary | ICD-10-CM

## 2015-03-12 DIAGNOSIS — N390 Urinary tract infection, site not specified: Secondary | ICD-10-CM

## 2015-03-12 NOTE — Progress Notes (Signed)
03/12/2015 1:56 PM   Mellanie Bejarano 16-Jun-1921 [id number]  Referring provider: Coral Spikes, DO [address] [place], [place] [address]  Chief Complaint  Patient presents with  . Recurrent UTI    HPI:  Patient is a 81 year old African-American female presenting today for persistent urinary tract infection over the last month. Her daughter states that she was initially treated by her primary care provider for urinary tract infection. She was later admitted to the hospital for TIA and she was noted to have continued urinary tract infection. She was treated while she was an inpatient and discharged on oral antibiotics. Daughter states she recently was seen again by her primary care provider on the 22nd and was restarted on antibiotics. 03/06/15 Urine Culture- +50,000 E. Coli  Daughter reports patient has baseline incontinence saturating approximately 3-4 pads per day. She also wears depends and pads at night for nocturnal leakage. There is been no significant changes in urinary symptoms over the last month. No fevers no flank pain.  She is currently being treated as well for a vaginal yeast infection and using nystatin vaginal cream.  H2O intake approximately 16oz per day.   PMH: Past Medical History  Diagnosis Date  . Hyperlipidemia   . Hypertension   . History of DVT (deep vein thrombosis) 1980's    BLE  . Type II diabetes mellitus     Insulin dependent.   . Decubitus ulcer, buttock   . TIA (transient ischemic attack) 02/18/2015  . Arthritis     "all over"  . Breast cancer     Hx of R breast cancer s/p mastectomy; Currently has L breast cancer (Diagnosed 2015).   . Glaucoma, bilateral   . Essential hypertension 01/01/2015  . Decubitus ulcer of sacral region, stage 2 01/01/2015  . Osteoarthritis 01/01/2015  . Itchy scalp 02/10/2015  . CKD (chronic kidney disease), stage III 02/18/2015  . Recurrent UTI 03/06/2015  . Diabetes mellitus with insulin therapy 01/01/2015  . HLD  (hyperlipidemia) 01/01/2015  . History of breast cancer 01/01/2015  . Breast cancer, left breast 01/01/2015  . Glaucoma 01/01/2015  . Anxiety state 01/01/2015  . Right knee pain 02/10/2015    Surgical History: Past Surgical History  Procedure Laterality Date  . Cholecystectomy    . Medial partial knee replacement Right 1980's?  . Mastectomy Right 1980's  . Breast biopsy Right 1980's  . Abdominal hysterectomy      Home Medications:    Medication List       This list is accurate as of: 03/12/15  1:56 PM.  Always use your most recent med list.               amLODipine 5 MG tablet  Commonly known as:  NORVASC  Take 1 tablet (5 mg total) by mouth daily.     atorvastatin 40 MG tablet  Commonly known as:  LIPITOR  Take 40 mg by mouth daily at 6 PM.     B-D UF III MINI PEN NEEDLES 31G X 5 MM Misc  Generic drug:  Insulin Pen Needle     bimatoprost 0.01 % Soln  Commonly known as:  LUMIGAN  Place 1 drop into both eyes at bedtime.     brimonidine 0.1 % Soln  Commonly known as:  ALPHAGAN P  Place 1 drop into both eyes 2 (two) times daily.     CALCIUM 600 + D PO  Take 1 tablet by mouth daily.     cephALEXin  250 MG capsule  Commonly known as:  KEFLEX     ciprofloxacin 500 MG tablet  Commonly known as:  CIPRO  Take 1 tablet (500 mg total) by mouth 2 (two) times daily.     clonazePAM 0.5 MG tablet  Commonly known as:  KLONOPIN  Take 0.5 mg by mouth at bedtime.     clopidogrel 75 MG tablet  Commonly known as:  PLAVIX  Take 1 tablet (75 mg total) by mouth daily.     clotrimazole 2 % vaginal cream  Commonly known as:  GYNE-LOTRIMIN 3  Place 1 Applicatorful vaginally at bedtime. For 3 days.     CRANBERRY PO  Take by mouth.     exemestane 25 MG tablet  Commonly known as:  AROMASIN  Take 1 tablet (25 mg total) by mouth daily after breakfast.     Fluocinolone Acetonide 0.01 % Sham  Apply no more than 1 ounce to scalp once daily; work into [employer] and allow to remain on  scalp for ~5 minutes. Remove from hair and scalp by rinsing thoroughly with water.     FLUOCINOLONE ACETONIDE SCALP 0.01 % Oil     HUMALOG MIX 75/25 KWIKPEN (75-25) 100 UNIT/ML Kwikpen  Generic drug:  Insulin Lispro Prot & Lispro     hydrALAZINE 50 MG tablet  Commonly known as:  APRESOLINE  Take 1 tablet (50 mg total) by mouth 4 (four) times daily.     insulin aspart protamine - aspart (70-30) 100 UNIT/ML FlexPen  Commonly known as:  NOVOLOG MIX 70/30 FLEXPEN  Inject 50 units in the morning and 15 units in the evening     insulin aspart protamine - aspart (70-30) 100 UNIT/ML FlexPen  Commonly known as:  NOVOLOG MIX 70/30 FLEXPEN  Inject 0.35 mLs (35 Units total) into the skin 2 (two) times daily.     lisinopril 20 MG tablet  Commonly known as:  PRINIVIL,ZESTRIL  Take 20 mg by mouth 2 (two) times daily.     metoprolol 50 MG tablet  Commonly known as:  LOPRESSOR     metoprolol succinate 50 MG 24 hr tablet  Commonly known as:  TOPROL-XL  Take 50 mg by mouth 2 (two) times daily.     MULTIVITAMIN GUMMIES ADULT PO  Take 1 tablet by mouth daily.     nystatin 100000 UNIT/ML suspension  Commonly known as:  MYCOSTATIN  Take 5 mLs (500,000 Units total) by mouth 4 (four) times daily. Swish then swallow.     omeprazole 20 MG capsule  Commonly known as:  PRILOSEC     ranitidine 150 MG capsule  Commonly known as:  ZANTAC  Take 1 capsule (150 mg total) by mouth 2 (two) times daily.     TRUEPLUS SAFETY LANCETS 28G Misc     TRUETEST TEST test strip  Generic drug:  glucose blood     Vitamin D3 1000 UNITS Caps  Take 1 capsule by mouth daily.        Allergies: No Known Allergies  Family History: Family History  Problem Relation Age of Onset  . Diabetes Son   . Diabetes Son   . Heart disease Mother   . Kidney cancer Neg Hx   . Kidney disease Son   . Bladder Cancer Neg Hx   . Prostate cancer Neg Hx     Social History:  reports that she has never smoked. She has never  used smokeless tobacco. She reports that she does not drink alcohol or use  illicit drugs.  ROS: UROLOGY Frequent Urination?: No Hard to postpone urination?: No Burning/pain with urination?: Yes Get up at night to urinate?: No Leakage of urine?: No Urine stream starts and stops?: No Trouble starting stream?: No Do you have to strain to urinate?: No Blood in urine?: No Urinary tract infection?: Yes Sexually transmitted disease?: No Injury to kidneys or bladder?: No Painful intercourse?: No Weak stream?: No Currently pregnant?: No Vaginal bleeding?: No Last menstrual period?: hysterectomy   Gastrointestinal Nausea?: No Vomiting?: No Indigestion/heartburn?: No Diarrhea?: No Constipation?: Yes  Constitutional Fever: No Night sweats?: Yes Weight loss?: Yes Fatigue?: No  Skin Skin rash/lesions?: No Itching?: No  Eyes Blurred vision?: No Double vision?: No  Ears/Nose/Throat Sore throat?: No Sinus problems?: No  Hematologic/Lymphatic Swollen glands?: No Easy bruising?: No  Cardiovascular Leg swelling?: No Chest pain?: No  Respiratory Cough?: No Shortness of breath?: No  Endocrine Excessive thirst?: No  Musculoskeletal Back pain?: No Joint pain?: Yes  Neurological Headaches?: No Dizziness?: No  Psychologic Depression?: No Anxiety?: No  Physical Exam: BP 111/62 mmHg  Pulse 80  Ht 5\' 3"  (1.6 m)  Wt 152 lb (68.947 kg)  BMI 26.93 kg/m2  Constitutional:  Alert, No acute distress, wheelchair bound HEENT: [place] AT, moist mucus membranes.  Trachea midline, no masses. Cardiovascular: No clubbing, cyanosis, or edema. Respiratory: Normal respiratory effort, no increased work of breathing. GI: Abdomen is soft, nontender, nondistended, no abdominal masses GU: No CVA tenderness.  Pelvic: limited due to patient's mobility, external exam demonstrating significant vaginal atrophy, urethra retracted into vagina, no obvious cystocele, white vaginal discharge  possibly residual nystatin cream Skin: No rashes, bruises or suspicious lesions. Lymph: No cervical or inguinal adenopathy. Neurologic: Grossly intact, no focal deficits, moving all 4 extremities. Psychiatric: Normal mood and affect.  Laboratory Data: Lab Results  Component Value Date   WBC 8.0 02/18/2015   HGB 11.3* 02/18/2015   HCT 35.2* 02/18/2015   MCV 94.4 02/18/2015   PLT 338 02/18/2015    Lab Results  Component Value Date   CREATININE 1.05* 02/18/2015    No results found for: PSA  No results found for: TESTOSTERONE  Lab Results  Component Value Date   HGBA1C 7.4* 02/19/2015    Urinalysis  Pertinent Imaging:   Assessment & Plan:    1. Recurrent UTI-    UTI prevention strategies discussed.  Good perineal hygiene reviewed. Patient is encouraged tosignificantly increase daily water intake, start cranberry supplements to prevent invasive colonization along the urinary tract and probiotics, especially lactobacillus to restore normal vaginal flora. Previous urine culture positive for only 50,000 colonies of Escherichia coli. I suspect that patient is chronically dehydrated and she is not flushing debris from her bladder effectively. Renal ultrasound and KUB ordered for evaluation of possible stones as nidus for continued infection and/or turbid urine. - Urinalysis, Complete - Bladder Scan (Post Void Residual) in office In & Out catheterization performed using sterile technique with approximately 57mL turbid white urine obtained.  Sent for UA and culture. -RUS and KUB -Urine Culture  2. Vaginal Atrophy- Hx of breast CA.  Not a candidate for HRT.  3. Incontinence- Baseline urinary incontinence. Prescription sent for incontinence supplies.   Return if symptoms worsen or fail to improve, for 5 days nurse visit for cath specimen for culture; f/u  RUS KUB results.  Herbert Moors, [facility] [address] [place], [place]  [address] [phone]

## 2015-03-12 NOTE — Telephone Encounter (Signed)
Him and I are playing phone tag. Leaving a voicemail to call me back.

## 2015-03-12 NOTE — Patient Instructions (Addendum)
Increase daily water intake. Start cranberry supplements 1 tab twice daily. Start Colace OTC 1-2 pills per day and probiotics for constipation. Return 2 days after finished with antibiotic for nurse visit for catheterized specimen.

## 2015-03-14 LAB — CULTURE, URINE COMPREHENSIVE

## 2015-03-17 ENCOUNTER — Ambulatory Visit (INDEPENDENT_AMBULATORY_CARE_PROVIDER_SITE_OTHER): Payer: Medicare PPO

## 2015-03-17 DIAGNOSIS — N390 Urinary tract infection, site not specified: Secondary | ICD-10-CM

## 2015-03-17 LAB — URINALYSIS, COMPLETE
BILIRUBIN UA: NEGATIVE
BILIRUBIN UA: NEGATIVE
GLUCOSE, UA: NEGATIVE
GLUCOSE, UA: NEGATIVE
KETONES UA: NEGATIVE
Ketones, UA: NEGATIVE
Nitrite, UA: NEGATIVE
Nitrite, UA: NEGATIVE
SPEC GRAV UA: 1.025 (ref 1.005–1.030)
Urobilinogen, Ur: 0.2 mg/dL (ref 0.2–1.0)
Urobilinogen, Ur: 0.2 mg/dL (ref 0.2–1.0)
pH, UA: 5.5 (ref 5.0–7.5)
pH, UA: 6 (ref 5.0–7.5)

## 2015-03-17 LAB — MICROSCOPIC EXAMINATION
EPITHELIAL CELLS (NON RENAL): NONE SEEN /HPF (ref 0–10)
Epithelial Cells (non renal): NONE SEEN /hpf (ref 0–10)
RBC, UA: NONE SEEN /hpf (ref 0–?)
RBC, UA: NONE SEEN /hpf (ref 0–?)
WBC, UA: >30 /hpf — ABNORMAL HIGH (ref 0–?)
WBC, UA: >30 /hpf — ABNORMAL HIGH (ref 0–?)

## 2015-03-17 NOTE — Telephone Encounter (Signed)
Ask for him to fax if not able to get in touch with me.

## 2015-03-17 NOTE — Progress Notes (Signed)
In and Out Catheterization  Patient is present today for a I & O catheterization due to recurrent UTI. Patient was cleaned and prepped in a sterile fashion with betadine and Lidocaine 2% jelly was instilled into the urethra.  A 14FR cath was inserted no complications were noted , 102ml of urine return was noted, urine was milky/coudy in color. A clean urine sample was collected for UA and culture. Bladder was drained  And catheter was removed with out difficulty.    Preformed by: Fonnie Jarvis, CMA  Follow up/ Additional notes: Will call with results   Per Herbert Moors, NP patient's daughter was notified that urine would be sent for culture and we would await culture results before sending in an antibiotic. The urine done last week looked similar and the culture was negative based on this we would like to wait on results. It was again stressed to the patient's daughter the need for patient to increase her water intake to 6-8 glasses a day if possible, this may help with her symptoms. Patient's daughter verbalized understanding/SW

## 2015-03-18 DIAGNOSIS — Z993 Dependence on wheelchair: Secondary | ICD-10-CM | POA: Insufficient documentation

## 2015-03-18 NOTE — Telephone Encounter (Signed)
[name] called back a note was faxed with the information that was needed.

## 2015-03-18 NOTE — Assessment & Plan Note (Signed)
Patient has severe osteoarthritis of the knee in addition to other comorbidities that make ambulation difficult. Additionally, her gait instability cannot be resolved with the use of a cane/crutches/walker. She is in need of a wheelchair. She has a full time caregiver at home.

## 2015-03-20 ENCOUNTER — Other Ambulatory Visit: Payer: Self-pay | Admitting: Obstetrics and Gynecology

## 2015-03-20 DIAGNOSIS — B3749 Other urogenital candidiasis: Secondary | ICD-10-CM

## 2015-03-20 LAB — CULTURE, URINE COMPREHENSIVE

## 2015-03-20 MED ORDER — FLUCONAZOLE 200 MG PO TABS: 200.0000 mg | ORAL_TABLET | Freq: Every day | ORAL | Status: DC

## 2015-03-21 ENCOUNTER — Telehealth: Payer: Self-pay

## 2015-03-21 NOTE — Telephone Encounter (Signed)
Spoke with pt daughter, [name], in reference to yeast infection. [name] voiced understanding. Also made daughter aware the need for taking a probiotic and a 3 weeks f/u. [name] voiced understanding. Daughter was transferred to the front to make f/u appt.

## 2015-03-21 NOTE — Telephone Encounter (Signed)
-----   Message from Roda Shutters, [place] sent at 03/20/2015  3:27 PM EDT ----- Please notify patient and her daughter that her urine culture grew out a significant amount of yeast. I would like to treat her with 2 weeks worth of Diflucan for her yeast UTI. This medication will interact with a her atorvastatin and she needs to stop the atorvastatin for 2 weeks while she is taking the Diflucan. I would also like her to start taking over-the-counter probiotic. Please have her follow-up in 3 weeks for recheck with me.

## 2015-03-24 ENCOUNTER — Other Ambulatory Visit: Payer: Self-pay | Admitting: Oncology

## 2015-03-24 ENCOUNTER — Ambulatory Visit
Admission: RE | Admit: 2015-03-24 | Discharge: 2015-03-24 | Disposition: A | Discharge status: Home / Self Care | Payer: Medicare PPO | Source: Ambulatory Visit | Attending: Obstetrics and Gynecology | Admitting: Obstetrics and Gynecology

## 2015-03-24 ENCOUNTER — Ambulatory Visit
Admission: RE | Admit: 2015-03-24 | Discharge: 2015-03-24 | Disposition: A | Discharge status: Home / Self Care | Payer: Medicare PPO | Source: Ambulatory Visit | Attending: Oncology | Admitting: Oncology

## 2015-03-24 DIAGNOSIS — N390 Urinary tract infection, site not specified: Secondary | ICD-10-CM

## 2015-03-24 DIAGNOSIS — I878 Other specified disorders of veins: Secondary | ICD-10-CM | POA: Insufficient documentation

## 2015-03-24 DIAGNOSIS — N281 Cyst of kidney, acquired: Secondary | ICD-10-CM | POA: Insufficient documentation

## 2015-03-24 DIAGNOSIS — C50912 Malignant neoplasm of unspecified site of left female breast: Secondary | ICD-10-CM | POA: Diagnosis not present

## 2015-04-10 ENCOUNTER — Encounter: Payer: Self-pay | Admitting: Family Medicine

## 2015-04-10 ENCOUNTER — Ambulatory Visit: Payer: Self-pay | Admitting: Family Medicine

## 2015-04-10 ENCOUNTER — Ambulatory Visit (INDEPENDENT_AMBULATORY_CARE_PROVIDER_SITE_OTHER): Payer: Medicare PPO | Admitting: Family Medicine

## 2015-04-10 VITALS — BP 130/88 | HR 79 | Temp 97.7°F | Ht 63.0 in | Wt 192.5 lb

## 2015-04-10 DIAGNOSIS — I10 Essential (primary) hypertension: Secondary | ICD-10-CM

## 2015-04-10 DIAGNOSIS — I739 Peripheral vascular disease, unspecified: Secondary | ICD-10-CM | POA: Insufficient documentation

## 2015-04-10 DIAGNOSIS — M150 Primary generalized (osteo)arthritis: Secondary | ICD-10-CM

## 2015-04-10 DIAGNOSIS — E1149 Type 2 diabetes mellitus with other diabetic neurological complication: Secondary | ICD-10-CM | POA: Diagnosis not present

## 2015-04-10 DIAGNOSIS — M159 Polyosteoarthritis, unspecified: Secondary | ICD-10-CM

## 2015-04-10 MED ORDER — HYDROCODONE-ACETAMINOPHEN 5-325 MG PO TABS: 1.0000 | ORAL_TABLET | Freq: Four times a day (QID) | ORAL | Status: DC | PRN

## 2015-04-10 NOTE — Assessment & Plan Note (Signed)
At goal. Will continue current medications.

## 2015-04-10 NOTE — Progress Notes (Signed)
Pre visit review using our clinic review tool, if applicable. No additional management support is needed unless otherwise documented below in the visit note. 

## 2015-04-10 NOTE — Assessment & Plan Note (Signed)
A1C at goal.  Continue current insulin therapy.

## 2015-04-10 NOTE — Patient Instructions (Addendum)
It was great to see you today.  Continue your current medications.  Use the pain medication as needed for pain.  We will call with your appt with Vascular.  Follow up in 3 months.  Take care  Dr. Lacinda Axon

## 2015-04-10 NOTE — Progress Notes (Signed)
Subjective:  Patient ID: [name], female    DOB: [birth year]  Age: 82 y.o. MRN: [id number]  CC: Follow up chronic conditions  HPI:  81 year old female with a comp and get a past medical history including recurrent breast cancer, DM 2, hypertension, HLD, OA presents for routine follow-up.  HTN  Stable and at goal.   Medications - hydralazine, lisinopril, Norvasc.  Compliance -  Yes.    HLD  Stable & well-controlled.  Patient is not currently on her statin and I'm not clear why.  Her last LDL was 39.   OA  Patient continues to have significant knee pain particularly the right knee.  She reports that the pain is worse with activity and makes ambulation difficult.  She's had some improvement with injection therapy. No real response to tramadol.  Pain is relieved when she is at rest.  DM-2  Stable and at goal on insulin therapy.  Last A1c was 7.4.  Social Hx   Social History   Social History  . Marital Status: Widowed    Spouse Name: N/A  . Number of Children: N/A  . Years of Education: N/A   Social History Main Topics  . Smoking status: Never Smoker   . Smokeless tobacco: Never Used  . Alcohol Use: No  . Drug Use: No  . Sexual Activity: Not Asked   Other Topics Concern  . None   Social History Narrative   Lives with daughter/daughter's husband.   Denita Lung [phone]).    Review of Systems  Constitutional: Negative.   Musculoskeletal:       Right knee pain.   Objective:  BP 130/88 mmHg  Pulse 79  Temp(Src) 97.7 F (36.5 C) (Oral)  Ht 5\' 3"  (1.6 m)  Wt 192 lb 8 oz (87.317 kg)  BMI 34.11 kg/m2  SpO2 98%  BP/Weight 04/10/2015 03/12/2015 6/44/0347  Systolic BP 425 956 387  Diastolic BP 88 62 66  Wt. (Lbs) 192.5 152 -  BMI 34.11 26.93 -    Physical Exam  Constitutional:  Well-appearing elderly female in no acute distress.  Cardiovascular: Normal rate.  A regularly irregular rhythm present.  Likely PVC's or PAC's    Pulmonary/Chest: Effort normal and breath sounds normal.  Abdominal: Soft. She exhibits no distension. There is no tenderness.  Musculoskeletal:  Decreased ROM of the knees. Nontender to palpation today.  Neurological: She is alert.  Psychiatric: She has a normal mood and affect.  Vitals reviewed.   Lab Results  Component Value Date   WBC 8.0 02/18/2015   HGB 11.3* 02/18/2015   HCT 35.2* 02/18/2015   PLT 338 02/18/2015   GLUCOSE 247* 02/18/2015   CHOL 84 02/19/2015   TRIG 88 02/19/2015   HDL 27* 02/19/2015   LDLCALC 39 02/19/2015   ALT 21 02/18/2015   AST 22 02/18/2015   NA 139 02/18/2015   K 4.1 02/18/2015   CL 107 02/18/2015   CREATININE 1.05* 02/18/2015   BUN 21* 02/18/2015   CO2 27 02/18/2015   HGBA1C 7.4* 02/19/2015   Assessment & Plan:   Problem List Items Addressed This Visit    DM (diabetes mellitus), type 2 with neurological complications ([place])    F6E at goal.  Continue current insulin therapy.       Essential hypertension    At goal. Will continue current medications.      Osteoarthritis    Osteo-arthritis of the right knee particularly interferes with mobility and quality of life. Injections  have been given and she had some improvement. Treating pain with Vicodin PRN (to help mobility and quality of life).       Relevant Medications   HYDROcodone-acetaminophen (NORCO/VICODIN) 5-325 MG tablet   Peripheral vascular disease ([place]) - Primary    Diabetic foot exam performed today. I could not appreciate pulses. Patient likely has peripheral vascular disease given exam in the setting of comorbidities. Sending to vascular for ABI's and further eval.          Meds ordered this encounter  Medications  . HYDROcodone-acetaminophen (NORCO/VICODIN) 5-325 MG tablet    Sig: Take 1 tablet by mouth every 6 (six) hours as needed for moderate pain.    Dispense:  30 tablet    Refill:  0    Follow-up: 3 months.   [name], DO

## 2015-04-10 NOTE — Assessment & Plan Note (Signed)
Osteo-arthritis of the right knee particularly interferes with mobility and quality of life. Injections have been given and she had some improvement. Treating pain with Vicodin PRN (to help mobility and quality of life).

## 2015-04-10 NOTE — Assessment & Plan Note (Signed)
Diabetic foot exam performed today. I could not appreciate pulses. Patient likely has peripheral vascular disease given exam in the setting of comorbidities. Sending to vascular for ABI's and further eval.

## 2015-04-11 ENCOUNTER — Encounter: Payer: Self-pay | Admitting: Obstetrics and Gynecology

## 2015-04-11 ENCOUNTER — Ambulatory Visit (INDEPENDENT_AMBULATORY_CARE_PROVIDER_SITE_OTHER): Payer: Medicare PPO | Admitting: Obstetrics and Gynecology

## 2015-04-11 ENCOUNTER — Other Ambulatory Visit: Payer: Self-pay | Admitting: Surgical

## 2015-04-11 VITALS — BP 143/76 | HR 103 | Resp 16 | Ht 63.0 in

## 2015-04-11 DIAGNOSIS — N390 Urinary tract infection, site not specified: Secondary | ICD-10-CM | POA: Diagnosis not present

## 2015-04-11 LAB — BLADDER SCAN AMB NON-IMAGING

## 2015-04-11 MED ORDER — CLOPIDOGREL BISULFATE 75 MG PO TABS
75.0000 mg | ORAL_TABLET | Freq: Every day | ORAL | Status: DC
Start: 2015-04-11 — End: 2015-04-14

## 2015-04-11 NOTE — Progress Notes (Signed)
In and Out Catheterization  Patient is present today for a I & O catheterization due to recurrent uti. Patient was cleaned and prepped in a sterile fashion with betadine and Lidocaine 2% jelly was instilled into the urethra.  A 14FR cath was inserted no complications were noted , 261ml of urine return was noted, urine was cloudy and yellow in color. A clean urine sample was collected for clean catch urine . Bladder was drained  and catheter was removed with out difficulty.    Preformed by: Lyndee Hensen CMA

## 2015-04-11 NOTE — Progress Notes (Signed)
04/11/2015 7:56 AM   [name] 03/13/1922 [id number]  Referring provider: Coral Spikes, DO [address] [place], [place] [address]  Chief Complaint  Patient presents with  . Recurrent UTI    3 wk f/u    HPI: Patient is a 81 year old female with a history of persistent urinary tract infection. She was most recently treated for yeast UTI last visit. She presents today for follow-up and recheck.  She reports symptoms have improved but that she continues to experience some continued dysuria.  Previous Complaint History: Patient is a 81 year old African-American female presenting today for persistent urinary tract infection over the last month. Her daughter states that she was initially treated by her primary care provider for urinary tract infection. She was later admitted to the hospital for TIA and she was noted to have continued urinary tract infection. She was treated while she was an inpatient and discharged on oral antibiotics. Daughter states she recently was seen again by her primary care provider on the 22nd and was restarted on antibiotics. 03/06/15 Urine Culture- +50,000 E. Coli  Daughter reports patient has baseline incontinence saturating approximately 3-4 pads per day. She also wears depends and pads at night for nocturnal leakage. There is been no significant changes in urinary symptoms over the last month. No fevers no flank pain.  She is currently being treated as well for a vaginal yeast infection and using nystatin vaginal cream.  H2O intake approximately 16oz per day.   PMH: Past Medical History  Diagnosis Date  . Hyperlipidemia   . Hypertension   . History of DVT (deep vein thrombosis) 1980's    BLE  . Type II diabetes mellitus (HCC)     Insulin dependent.   . Decubitus ulcer, buttock   . TIA (transient ischemic attack) 02/18/2015  . Arthritis     "all over"  . Breast cancer ([place])     Hx of R breast cancer s/p mastectomy; Currently has L breast cancer  (Diagnosed 2015).   . Glaucoma, bilateral   . Essential hypertension 01/01/2015  . Decubitus ulcer of sacral region, stage 2 01/01/2015  . Osteoarthritis 01/01/2015  . Itchy scalp 02/10/2015  . CKD (chronic kidney disease), stage III 02/18/2015  . Recurrent UTI 03/06/2015  . Diabetes mellitus with insulin therapy ([place]) 01/01/2015  . HLD (hyperlipidemia) 01/01/2015  . History of breast cancer 01/01/2015  . Breast cancer, left breast ([place]) 01/01/2015  . Glaucoma 01/01/2015  . Anxiety state 01/01/2015  . Right knee pain 02/10/2015    Surgical History: Past Surgical History  Procedure Laterality Date  . Cholecystectomy    . Medial partial knee replacement Right 1980's?  . Mastectomy Right 1980's  . Abdominal hysterectomy    . Breast biopsy Right 1980's    +  . Breast excisional biopsy Left 2015    + stereo no surgery    Home Medications:    Medication List       This list is accurate as of: 04/11/15 11:59 PM.  Always use your most recent med list.               amLODipine 5 MG tablet  Commonly known as:  NORVASC  Take 1 tablet (5 mg total) by mouth daily.     B-D UF III MINI PEN NEEDLES 31G X 5 MM Misc  Generic drug:  Insulin Pen Needle     bimatoprost 0.01 % Soln  Commonly known as:  LUMIGAN  Place 1 drop into both  eyes at bedtime.     brimonidine 0.1 % Soln  Commonly known as:  ALPHAGAN P  Place 1 drop into both eyes 2 (two) times daily.     CALCIUM 600 + D PO  Take 1 tablet by mouth daily.     clonazePAM 0.5 MG tablet  Commonly known as:  KLONOPIN  Take 0.5 mg by mouth at bedtime.     clopidogrel 75 MG tablet  Commonly known as:  PLAVIX  Take 1 tablet (75 mg total) by mouth daily.     CRANBERRY PO  Take by mouth.     exemestane 25 MG tablet  Commonly known as:  AROMASIN  Take 1 tablet (25 mg total) by mouth daily after breakfast.     fluconazole 200 MG tablet  Commonly known as:  DIFLUCAN  TAKE 1 TABLET (200 MG TOTAL) BY MOUTH DAILY.     Fluocinolone  Acetonide 0.01 % Sham  Apply no more than 1 ounce to scalp once daily; work into [employer] and allow to remain on scalp for ~5 minutes. Remove from hair and scalp by rinsing thoroughly with water.     hydrALAZINE 50 MG tablet  Commonly known as:  APRESOLINE  Take 1 tablet (50 mg total) by mouth 4 (four) times daily.     HYDROcodone-acetaminophen 5-325 MG tablet  Commonly known as:  NORCO/VICODIN  Take 1 tablet by mouth every 6 (six) hours as needed for moderate pain.     insulin aspart protamine - aspart (70-30) 100 UNIT/ML FlexPen  Commonly known as:  NOVOLOG MIX 70/30 FLEXPEN  Inject 0.35 mLs (35 Units total) into the skin 2 (two) times daily.     lisinopril 20 MG tablet  Commonly known as:  PRINIVIL,ZESTRIL  Take 20 mg by mouth 2 (two) times daily.     MULTIVITAMIN GUMMIES ADULT PO  Take 1 tablet by mouth daily.     omeprazole 20 MG capsule  Commonly known as:  PRILOSEC     ranitidine 150 MG capsule  Commonly known as:  ZANTAC  Take 1 capsule (150 mg total) by mouth 2 (two) times daily.     TRUEPLUS SAFETY LANCETS 28G Misc     TRUETEST TEST test strip  Generic drug:  glucose blood     Vitamin D3 1000 UNITS Caps  Take 1 capsule by mouth daily.        Allergies: No Known Allergies  Family History: Family History  Problem Relation Age of Onset  . Diabetes Son   . Diabetes Son   . Heart disease Mother   . Kidney cancer Neg Hx   . Kidney disease Son   . Bladder Cancer Neg Hx   . Prostate cancer Neg Hx     Social History:  reports that she has never smoked. She has never used smokeless tobacco. She reports that she does not drink alcohol or use illicit drugs.  ROS: UROLOGY Frequent Urination?: No Hard to postpone urination?: No Burning/pain with urination?: Yes Get up at night to urinate?: No Leakage of urine?: No Urine stream starts and stops?: No Trouble starting stream?: No Do you have to strain to urinate?: No Blood in urine?: No Urinary tract  infection?: Yes Sexually transmitted disease?: No Injury to kidneys or bladder?: No Painful intercourse?: No Weak stream?: No Currently pregnant?: No Vaginal bleeding?: No Last menstrual period?: n  Gastrointestinal Nausea?: No Vomiting?: No Indigestion/heartburn?: No Diarrhea?: No Constipation?: Yes  Constitutional Fever: No Night sweats?: Yes Weight loss?:  No Fatigue?: No  Skin Skin rash/lesions?: No Itching?: Yes  Eyes Blurred vision?: No Double vision?: No  Ears/Nose/Throat Sore throat?: No Sinus problems?: No  Hematologic/Lymphatic Swollen glands?: No Easy bruising?: No  Cardiovascular Leg swelling?: No Chest pain?: No  Respiratory Cough?: Yes Shortness of breath?: No  Endocrine Excessive thirst?: No  Musculoskeletal Back pain?: No Joint pain?: Yes  Neurological Headaches?: No Dizziness?: No  Psychologic Depression?: No Anxiety?: No  Physical Exam: BP 143/76 mmHg  Pulse 103  Resp 16  Ht 5\' 3"  (1.6 m)  Wt   Constitutional:  Alert and oriented, No acute distress. HEENT: [place] AT, moist mucus membranes.  Trachea midline, no masses. Cardiovascular: No clubbing, cyanosis, or edema. Respiratory: Normal respiratory effort, no increased work of breathing. GI: Abdomen is soft, nontender, nondistended, no abdominal masses GU: No CVA tenderness.  Skin: No rashes, bruises or suspicious lesions. Lymph: No cervical or inguinal adenopathy. Neurologic: Grossly intact, no focal deficits, moving all 4 extremities. Psychiatric: Normal mood and affect.  Laboratory Data:   Urinalysis    Component Value Date/Time   COLORURINE YELLOW 02/18/2015 1440   COLORURINE Yellow 03/01/2012 2104   APPEARANCEUR TURBID* 02/18/2015 1440   APPEARANCEUR Cloudy 03/01/2012 2104   LABSPEC 1.015 02/18/2015 1440   LABSPEC 1.013 03/01/2012 2104   PHURINE 6.0 02/18/2015 1440   PHURINE 5.0 03/01/2012 2104   GLUCOSEU Negative 04/11/2015 1649   GLUCOSEU Negative  03/01/2012 2104   HGBUR TRACE* 02/18/2015 1440   HGBUR Negative 03/01/2012 2104   BILIRUBINUR Negative 04/11/2015 1649   BILIRUBINUR neg 03/06/2015 1200   BILIRUBINUR NEGATIVE 02/18/2015 1440   BILIRUBINUR Negative 03/01/2012 2104   KETONESUR NEGATIVE 02/18/2015 1440   KETONESUR Negative 03/01/2012 2104   PROTEINUR >=300 03/06/2015 1200   PROTEINUR 30* 02/18/2015 1440   PROTEINUR Negative 03/01/2012 2104   UROBILINOGEN 0.2 03/06/2015 1200   UROBILINOGEN 0.2 02/18/2015 1440   NITRITE Negative 04/11/2015 1649   NITRITE neg 03/06/2015 1200   NITRITE NEGATIVE 02/18/2015 1440   NITRITE Negative 03/01/2012 2104   LEUKOCYTESUR 3+* 04/11/2015 1649   LEUKOCYTESUR large (3+)* 03/06/2015 1200   LEUKOCYTESUR Negative 03/01/2012 2104    Pertinent Imaging:   Assessment & Plan:    1. Recurrent UTI- Catheterized urine specimen obtained today.  UA sowing >30 WBCs, 3-10 RBCs and many bacteria.  Specimen sent for culture.  We will hold treatment pending culture results. I will discuss this further with MD as to whether a cystoscopy may be beneficial for further work up. - Urinalysis, Complete - BLADDER SCAN AMB NON-IMAGING   No Follow-up on file.  These notes generated with voice recognition software. I apologize for typographical errors.  Herbert Moors, [facility] [address] [place], [place] [address] [phone]

## 2015-04-12 LAB — MICROSCOPIC EXAMINATION
Renal Epithel, UA: NONE SEEN /hpf
WBC, UA: >30 /hpf — AB (ref 0–?)

## 2015-04-12 LAB — URINALYSIS, COMPLETE
BILIRUBIN UA: NEGATIVE
GLUCOSE, UA: NEGATIVE
Ketones, UA: NEGATIVE
Nitrite, UA: NEGATIVE
SPEC GRAV UA: 1.025 (ref 1.005–1.030)
UUROB: 0.2 mg/dL (ref 0.2–1.0)
pH, UA: 5 (ref 5.0–7.5)

## 2015-04-13 LAB — CULTURE, URINE COMPREHENSIVE

## 2015-04-14 ENCOUNTER — Other Ambulatory Visit: Payer: Self-pay

## 2015-04-14 ENCOUNTER — Other Ambulatory Visit: Payer: Self-pay | Admitting: Family Medicine

## 2015-04-14 MED ORDER — AMLODIPINE BESYLATE 5 MG PO TABS: 5.0000 mg | ORAL_TABLET | Freq: Every day | ORAL | Status: DC

## 2015-04-14 MED ORDER — CLOPIDOGREL BISULFATE 75 MG PO TABS: 75.0000 mg | ORAL_TABLET | Freq: Every day | ORAL | Status: DC

## 2015-04-15 ENCOUNTER — Telehealth: Payer: Self-pay

## 2015-04-15 DIAGNOSIS — N390 Urinary tract infection, site not specified: Secondary | ICD-10-CM

## 2015-04-15 MED ORDER — AMOXICILLIN 500 MG PO TABS: 500.0000 mg | ORAL_TABLET | Freq: Two times a day (BID) | ORAL | Status: AC

## 2015-04-15 NOTE — Telephone Encounter (Signed)
-----   Message from Roda Shutters, [place] sent at 04/15/2015 12:35 PM EDT ----- Please notify patient that her urine culture grew out a moderate amount of bacteria.  I would like to treat her with Amoxicillin 500mg  bid x 7 days.  I consulted Dr. Junious Silk and he does not feel that a cystoscopy would be beneficial at this time.  thanks

## 2015-04-15 NOTE — Telephone Encounter (Signed)
Spoke with pt caregiver in reference UTI and cysto. Caregiver voiced understanding stating she would relay the information to the family.

## 2015-04-22 ENCOUNTER — Other Ambulatory Visit: Payer: Self-pay | Admitting: Vascular Surgery

## 2015-04-28 ENCOUNTER — Encounter: Payer: Self-pay | Admitting: *Deleted

## 2015-04-28 ENCOUNTER — Encounter
Admission: RE | Disposition: A | Discharge status: Home / Self Care | Payer: Self-pay | Source: Ambulatory Visit | Attending: Vascular Surgery

## 2015-04-28 ENCOUNTER — Ambulatory Visit
Admission: RE | Admit: 2015-04-28 | Discharge: 2015-04-28 | Disposition: A | Discharge status: Home / Self Care | Payer: Medicare PPO | Source: Ambulatory Visit | Attending: Vascular Surgery | Admitting: Vascular Surgery

## 2015-04-28 DIAGNOSIS — E119 Type 2 diabetes mellitus without complications: Secondary | ICD-10-CM | POA: Insufficient documentation

## 2015-04-28 DIAGNOSIS — E785 Hyperlipidemia, unspecified: Secondary | ICD-10-CM | POA: Diagnosis not present

## 2015-04-28 DIAGNOSIS — Z79899 Other long term (current) drug therapy: Secondary | ICD-10-CM | POA: Insufficient documentation

## 2015-04-28 DIAGNOSIS — I70222 Atherosclerosis of native arteries of extremities with rest pain, left leg: Secondary | ICD-10-CM | POA: Diagnosis present

## 2015-04-28 DIAGNOSIS — Z794 Long term (current) use of insulin: Secondary | ICD-10-CM | POA: Insufficient documentation

## 2015-04-28 DIAGNOSIS — I10 Essential (primary) hypertension: Secondary | ICD-10-CM | POA: Diagnosis not present

## 2015-04-28 HISTORY — PX: PERIPHERAL VASCULAR CATHETERIZATION: SHX172C

## 2015-04-28 LAB — BASIC METABOLIC PANEL
ANION GAP: 3 — AB (ref 5–15)
BUN: 25 mg/dL — AB (ref 6–20)
CALCIUM: 9.3 mg/dL (ref 8.9–10.3)
CO2: 28 mmol/L (ref 22–32)
Chloride: 110 mmol/L (ref 101–111)
Creatinine, Ser: 1.07 mg/dL — ABNORMAL HIGH (ref 0.44–1.00)
GFR calc Af Amer: 51 mL/min — ABNORMAL LOW (ref >60)
GFR calc non Af Amer: 44 mL/min — ABNORMAL LOW (ref >60)
GLUCOSE: 225 mg/dL — AB (ref 65–99)
POTASSIUM: 4.1 mmol/L (ref 3.5–5.1)
Sodium: 141 mmol/L (ref 135–145)

## 2015-04-28 SURGERY — LOWER EXTREMITY INTERVENTION
Wound class: Clean

## 2015-04-28 MED ORDER — LIDOCAINE-EPINEPHRINE (PF) 1 %-1:200000 IJ SOLN
INTRAMUSCULAR | Status: AC
  Filled 2015-04-28: qty 30

## 2015-04-28 MED ORDER — HEPARIN SODIUM (PORCINE) 1000 UNIT/ML IJ SOLN
INTRAMUSCULAR | Status: DC | PRN
  Administered 2015-04-28: 4000 [IU] via INTRAVENOUS

## 2015-04-28 MED ORDER — FENTANYL CITRATE (PF) 100 MCG/2ML IJ SOLN
INTRAMUSCULAR | Status: AC
Start: 2015-04-28 — End: 2015-04-28
  Filled 2015-04-28: qty 2

## 2015-04-28 MED ORDER — HEPARIN (PORCINE) IN NACL 2-0.9 UNIT/ML-% IJ SOLN
INTRAMUSCULAR | Status: AC
  Filled 2015-04-28: qty 1000

## 2015-04-28 MED ORDER — IOHEXOL 300 MG/ML  SOLN
INTRAMUSCULAR | Status: DC | PRN
  Administered 2015-04-28: 70 mL via INTRA_ARTERIAL

## 2015-04-28 MED ORDER — SODIUM CHLORIDE 0.9 % IV SOLN
INTRAVENOUS | Status: DC
  Administered 2015-04-28: 09:00:00 via INTRAVENOUS

## 2015-04-28 MED ORDER — FENTANYL CITRATE (PF) 100 MCG/2ML IJ SOLN
INTRAMUSCULAR | Status: DC | PRN
  Administered 2015-04-28: 25 ug via INTRAVENOUS
  Administered 2015-04-28: 50 ug via INTRAVENOUS

## 2015-04-28 MED ORDER — DEXTROSE 5 % IV SOLN: 1.5000 g | INTRAVENOUS | Status: DC

## 2015-04-28 MED ORDER — HEPARIN SODIUM (PORCINE) 1000 UNIT/ML IJ SOLN
INTRAMUSCULAR | Status: AC
  Filled 2015-04-28: qty 1

## 2015-04-28 MED ORDER — LIDOCAINE-EPINEPHRINE (PF) 1 %-1:200000 IJ SOLN
INTRAMUSCULAR | Status: DC | PRN
  Administered 2015-04-28: 10 mL via INTRADERMAL

## 2015-04-28 MED ORDER — DEXTROSE 5 % IV SOLN
INTRAVENOUS | Status: AC
  Administered 2015-04-28: 09:00:00
  Filled 2015-04-28: qty 1.5

## 2015-04-28 MED ORDER — INSULIN ASPART 100 UNIT/ML ~~LOC~~ SOLN: 0.0000 [IU] | SUBCUTANEOUS | Status: DC

## 2015-04-28 MED ORDER — MIDAZOLAM HCL 2 MG/2ML IJ SOLN
INTRAMUSCULAR | Status: DC | PRN
  Administered 2015-04-28: 2 mg via INTRAVENOUS
  Administered 2015-04-28: 1 mg via INTRAVENOUS

## 2015-04-28 MED ORDER — MIDAZOLAM HCL 5 MG/5ML IJ SOLN
INTRAMUSCULAR | Status: AC
Start: 2015-04-28 — End: 2015-04-28
  Filled 2015-04-28: qty 5

## 2015-04-28 SURGICAL SUPPLY — 21 items
BALLN ARMADA 2X100X150 (BALLOONS) ×5
BALLN ULTRVRSE 4X300X150 (BALLOONS) ×5
BALLOON ARMADA 2X100X150 (BALLOONS) ×3 IMPLANT
BALLOON ULTRVRSE 4X300X150 (BALLOONS) ×3 IMPLANT
CATH CXI SUPP ANG 2.6FR 150CM (MICROCATHETER) ×5 IMPLANT
CATH CXI SUPP ANG 4FR 135 (MICROCATHETER) ×3 IMPLANT
CATH CXI SUPP ANG 4FR 135CM (MICROCATHETER) ×5
CATH ROYAL FLUSH PIG 5F 70CM (CATHETERS) ×5 IMPLANT
CATH VERT 100CM (CATHETERS) ×5 IMPLANT
DEVICE PRESTO INFLATION (MISCELLANEOUS) ×10 IMPLANT
DEVICE STARCLOSE SE CLOSURE (Vascular Products) ×5 IMPLANT
GLIDEWIRE ADV .035X260CM (WIRE) ×5 IMPLANT
GUIDEWIRE GOLD .018X300 (WIRE) ×5 IMPLANT
PACK ANGIOGRAPHY (CUSTOM PROCEDURE TRAY) ×5 IMPLANT
SHEATH ANL2 6FRX45 HC (SHEATH) ×5 IMPLANT
SHEATH BRITE TIP 5FRX11 (SHEATH) ×5 IMPLANT
STENT VIABAHN 6X250X120 (Permanent Stent) ×5 IMPLANT
SYR MEDRAD MARK V 150ML (SYRINGE) ×5 IMPLANT
TUBING CONTRAST HIGH PRESS 72 (TUBING) ×5 IMPLANT
WIRE G V18X300CM (WIRE) ×10 IMPLANT
WIRE J 3MM .035X145CM (WIRE) ×5 IMPLANT

## 2015-04-28 NOTE — Discharge Instructions (Signed)

## 2015-04-28 NOTE — Op Note (Signed)
[facility] Percutaneous Study/Intervention Procedural Note   Date of Surgery: 04/28/2015  Surgeon(s):Arby Dahir   Assistants:none  Pre-operative Diagnosis: PAD with rest pain left lower extremity  Post-operative diagnosis: Same  Procedure(s) Performed: 1. Ultrasound guidance for vascular access right femoral artery 2. Catheter placement into left posterior tibial artery from right femoral approach 3. Aortogram and selective left lower extremity angiogram 4. Percutaneous transluminal angioplasty of the posterior tibial artery and the distal bypass anastomosis with a 2 mm diameter angioplasty balloon 5. Femoral to posterior tibial bypass with 4 mm diameter angioplasty balloon  6.  Viabahn covered stent placement using a 6 mm diameter by 25 cm length stent to the midportion of the femoral to posterior tibial bypass graft for extravasation and greater than 50% residual stenosis after angioplasty 7. StarClose closure device right femoral artery  EBL: 25 cc  Indications: Patient is a 81 year old African-American female with extensive peripheral vascular disease. She has pain in her legs and was referred for vascular evaluation. The patient has noninvasive study showing a left ABI of 0.93 with flat digital pressures. The patient is brought in for angiography for further evaluation and potential treatment. Risks and benefits are discussed and informed consent is obtained  Procedure: The patient was identified and appropriate procedural time out was performed. The patient was then placed supine on the table and prepped and draped in the usual sterile fashion. Ultrasound was used to evaluate the right common femoral artery. It was patent . A digital ultrasound image was acquired. A Seldinger needle was used to access the right common femoral artery under direct ultrasound  guidance and a permanent image was performed. A 0.035 J wire was advanced without resistance and a 5Fr sheath was placed. Pigtail catheter was placed into the aorta and an AP aortogram was performed. This demonstrated normal right renal artery with stenosis of the left renal artery and normal aorta and iliac segments without significant stenosis. I then crossed the aortic bifurcation and advanced to the left femoral head. Selective left lower extremity angiogram was then performed. This demonstrated a flush occlusion of the left SFA. Surgical clips were present from an apparent previous left leg bypass which was also occluded. She reconstituted the distal superficial femoral artery but this in the popliteal artery were heavily diseased and a peroneal artery was seen distally after stenosis in the tibioperoneal trunk and proximal peroneal artery which also appeared high-grade. The patient was systemically heparinized and a 6 [place] Ansell sheath was then placed over the [employer] wire. I then used a Kumpe catheter and the advantage wire to navigate into the bypass graft. Gaining access to the native SFA was difficult. I was able to navigate through the bypass with the help of an advantage wire and then exchanging for a CXI catheter. Imaging within the bypass showed long segment occlusion with a posterior tibial artery that it was anastomosis to which terminated in the mid calf but had large collaterals. At this point, I decided to try to open the bypass and exchanged for a 0.018 wire which I was able to navigate down into the posterior tibial artery. The distal bypass anastomosis and posterior tibial artery over its first several cm segment were treated with a 2 mm diameter by 10 cm length angioplasty balloon inflated to 8 atm for 1 minute. I then treated the bypass from just below the knee up to its origin to the common femoral artery with 2 inflations of a 4 mm diameter by  30 cm length angioplasty balloon  inflated to 14 atm. Completion angiogram showed multiple areas of residual stenosis which was high-grade and extravasation in the midportion of the bypass graft in the mid thigh. I covered this area with a 6 mm diameter by 25 cm length Viabahn stent which resulted in flow through the graft. Marked improvement in the proximal portion of the posterior tibial artery was seen, but was still occluded in its midsegment. There were large collaterals that helped drain distally. Despite multiple attempts with 2 different CXI catheters, AV 18 wire, and a 0.018 gold-tipped Glidewire, I could never gain access to the distal posterior tibial artery in the foot to treat this more distally. I felt we had done all we could do from an endovascular standpoint today, and if subsequent attempts are necessary another effort to try to treat her native vessels may be considered in the future. I elected to terminate the procedure. The sheath was removed and StarClose closure device was deployed in the left femoral artery with excellent hemostatic result. The patient was taken to the recovery room in stable condition having tolerated the procedure well.  Findings:  Aortogram: left renal artery stenosis, aorta and iliac arteries without stenosis Left Lower Extremity: Long SFA occlusion and occlusion of femoral to distal bypass with tibial disease present.   Disposition: Patient was taken to the recovery room in stable condition having tolerated the procedure well.  Complications: None  Chesnee Floren 04/28/2015 10:38 AM

## 2015-04-28 NOTE — H&P (Signed)
  [facility] History & Physical Update  The patient was interviewed and re-examined.  The patient's previous History and Physical has been reviewed and is unchanged.  There is no change in the plan of care. We plan to proceed with the scheduled procedure.  Alonnie Bieker, MD  04/28/2015, 8:12 AM

## 2015-04-30 ENCOUNTER — Telehealth: Payer: Self-pay | Admitting: Family Medicine

## 2015-04-30 ENCOUNTER — Other Ambulatory Visit: Payer: Self-pay | Admitting: Family Medicine

## 2015-04-30 MED ORDER — BRIMONIDINE TARTRATE 0.1 % OP SOLN: 1.0000 [drp] | Freq: Two times a day (BID) | OPHTHALMIC | Status: DC

## 2015-04-30 NOTE — Telephone Encounter (Signed)
[name] [id number] called from [facility] regarding verbal order for skilled nursing one time a week for 5 weeks and home health aid 2 times a week for 4 weeks. Thank You!

## 2015-04-30 NOTE — Telephone Encounter (Signed)
Okay by me.

## 2015-04-30 NOTE — Telephone Encounter (Signed)
A voicemail was left on [name]'s phone. Giving her the okay for home health aid and skilled nursing

## 2015-04-30 NOTE — Telephone Encounter (Signed)
Dr. Lacinda Axon, Please advise?

## 2015-05-02 ENCOUNTER — Other Ambulatory Visit: Payer: Self-pay

## 2015-05-13 ENCOUNTER — Other Ambulatory Visit: Payer: Self-pay | Admitting: Family Medicine

## 2015-05-14 ENCOUNTER — Other Ambulatory Visit: Payer: Self-pay | Admitting: Family Medicine

## 2015-05-14 ENCOUNTER — Telehealth: Payer: Self-pay | Admitting: Family Medicine

## 2015-05-14 MED ORDER — FLUOCINOLONE ACETONIDE 0.01 % EX SHAM: MEDICATED_SHAMPOO | CUTANEOUS | Status: DC

## 2015-05-14 MED ORDER — FLUOCINOLONE ACETONIDE 0.01 % EX SHAM
MEDICATED_SHAMPOO | CUTANEOUS | Status: DC
Start: 2015-05-14 — End: 2015-06-24

## 2015-05-14 NOTE — Telephone Encounter (Signed)
[name] was called and told she said that she understood when i called 04/30/15

## 2015-05-14 NOTE — Telephone Encounter (Signed)
Lori 336 [phone] called [facility] regarding to follow up if we received a plan of care and interim orders. She wants to know which ones was received? Thank You!

## 2015-05-15 ENCOUNTER — Ambulatory Visit: Payer: Medicare PPO | Admitting: Family Medicine

## 2015-05-15 DIAGNOSIS — Z0289 Encounter for other administrative examinations: Secondary | ICD-10-CM

## 2015-05-16 ENCOUNTER — Encounter: Payer: Self-pay | Admitting: Family Medicine

## 2015-05-16 ENCOUNTER — Ambulatory Visit (INDEPENDENT_AMBULATORY_CARE_PROVIDER_SITE_OTHER): Payer: Medicare PPO | Admitting: Family Medicine

## 2015-05-16 ENCOUNTER — Ambulatory Visit (INDEPENDENT_AMBULATORY_CARE_PROVIDER_SITE_OTHER)
Admission: RE | Admit: 2015-05-16 | Discharge: 2015-05-16 | Disposition: A | Discharge status: Home / Self Care | Payer: Medicare PPO | Source: Ambulatory Visit | Attending: Family Medicine | Admitting: Family Medicine

## 2015-05-16 DIAGNOSIS — R05 Cough: Secondary | ICD-10-CM

## 2015-05-16 DIAGNOSIS — J069 Acute upper respiratory infection, unspecified: Secondary | ICD-10-CM | POA: Diagnosis not present

## 2015-05-16 DIAGNOSIS — R059 Cough, unspecified: Secondary | ICD-10-CM

## 2015-05-16 DIAGNOSIS — B9789 Other viral agents as the cause of diseases classified elsewhere: Principal | ICD-10-CM

## 2015-05-16 MED ORDER — BENZONATATE 100 MG PO CAPS: 100.0000 mg | ORAL_CAPSULE | Freq: Three times a day (TID) | ORAL | Status: DC | PRN

## 2015-05-16 NOTE — Progress Notes (Signed)
Pre visit review using our clinic review tool, if applicable. No additional management support is needed unless otherwise documented below in the visit note. 

## 2015-05-16 NOTE — Patient Instructions (Signed)
This appears to be viral but I am obtaining a chest xray to ensure no pneumonia.  We we'll call with results.  Use the Tessalon for cough.  Tylenol as needed for pain/fever. OTC antihistamine (zyrtec, claritin).  Call me if she fails to improve or worsens.  Take care  Dr. Lacinda Axon

## 2015-05-16 NOTE — Assessment & Plan Note (Addendum)
New Problem. Patient afebrile with an unremarkable exam today. Given comorbidities and the fact that patient is at high risk for pneumonia, I ordered a chest x-ray. I independently reviewed the chest x-ray. It revealed cardiomegaly. No infiltrate. Treating with supportive care. Gave tessalon for cough. Rx sent.

## 2015-05-16 NOTE — Progress Notes (Signed)
   Subjective:  Patient ID: [name], female    DOB: [birth year]  Age: 81 y.o. MRN: [id number]  CC: Cough, cold  HPI:  81 year old female a complicated past medical history presents to the clinic today for evaluation of cough and cold symptoms.  Patient accompanied by daughter who is the primary caregiver.  Patient and caregiver report that she has been expansion cough, chest congestion and cold symptoms (runny nose, watery eyes) the past week. Caregiver states that she has had a fever at home (T max 100.1). Cough is nonproductive.  Patient also reports associated hoarseness. Caregiver endorses some mild shortness of breath. No exacerbating or relieving factors. No other reported symptoms. No known sick contacts.  Social Hx   Social History   Social History  . Marital Status: Widowed    Spouse Name: N/A  . Number of Children: N/A  . Years of Education: N/A   Social History Main Topics  . Smoking status: Never Smoker   . Smokeless tobacco: Never Used  . Alcohol Use: No  . Drug Use: No  . Sexual Activity: Not Asked   Other Topics Concern  . None   Social History Narrative   Lives with daughter/daughter's husband.   Denita Lung [phone]).    Review of Systems  Constitutional: Positive for fever.  HENT: Positive for congestion and voice change.   Respiratory: Positive for cough and shortness of breath.    Objective:  BP 130/82 mmHg  Pulse 98  Temp(Src) 98.9 F (37.2 C) (Oral)  Ht 5\' 3"  (1.6 m)  Wt 183 lb 2 oz (83.065 kg)  BMI 32.45 kg/m2  SpO2 97%  BP/Weight 05/16/2015 04/28/2015 [id number]  Systolic BP [id number]  Diastolic BP 82 59 76  Wt. (Lbs) 183.13 152 -  BMI 32.45 26.93 -   Physical Exam  Constitutional: She appears well-developed. No distress.  Resting comfortably in her wheelchair.  HENT:  Head: Normocephalic and atraumatic.  Mouth/Throat: Oropharynx is clear and moist.  Normal TMs bilaterally.  Eyes: Conjunctivae are normal. Pupils are  equal, round, and reactive to light.  Neck: Neck supple.  Cardiovascular: Normal rate and regular rhythm.   Pulmonary/Chest: Effort normal and breath sounds normal. She has no wheezes. She has no rales.  Lymphadenopathy:    She has no cervical adenopathy.  Neurological: She is alert.  Vitals reviewed.  Assessment & Plan:   Problem List Items Addressed This Visit    Viral URI with cough    New Problem. Patient afebrile with an unremarkable exam today. Given comorbidities and the fact that patient is at high risk for pneumonia, I ordered a chest x-ray. I independently reviewed the chest x-ray. It revealed cardiomegaly. No infiltrate. Treating with supportive care. Gave tessalon for cough. Rx sent.       Relevant Medications   benzonatate (TESSALON) 100 MG capsule   Other Relevant Orders   DG Chest 1 View (Completed)      Meds ordered this encounter  Medications  . benzonatate (TESSALON) 100 MG capsule    Sig: Take 1 capsule (100 mg total) by mouth 3 (three) times daily as needed.    Dispense:  30 capsule    Refill:  0    Follow-up: No Follow-up on file.  [place]

## 2015-05-19 ENCOUNTER — Encounter: Payer: Self-pay | Admitting: Family Medicine

## 2015-05-19 ENCOUNTER — Other Ambulatory Visit: Payer: Self-pay | Admitting: Family Medicine

## 2015-05-19 MED ORDER — PREDNISONE 50 MG PO TABS: ORAL_TABLET | ORAL | Status: DC

## 2015-05-19 MED ORDER — HYDROCOD POLST-CPM POLST ER 10-8 MG/5ML PO SUER: 5.0000 mL | Freq: Two times a day (BID) | ORAL | Status: DC | PRN

## 2015-05-21 DIAGNOSIS — Z8673 Personal history of transient ischemic attack (TIA), and cerebral infarction without residual deficits: Secondary | ICD-10-CM | POA: Diagnosis not present

## 2015-05-21 DIAGNOSIS — E119 Type 2 diabetes mellitus without complications: Secondary | ICD-10-CM | POA: Diagnosis not present

## 2015-05-21 DIAGNOSIS — N390 Urinary tract infection, site not specified: Secondary | ICD-10-CM | POA: Diagnosis not present

## 2015-05-21 DIAGNOSIS — B961 Klebsiella pneumoniae [K. pneumoniae] as the cause of diseases classified elsewhere: Secondary | ICD-10-CM | POA: Diagnosis not present

## 2015-05-30 ENCOUNTER — Telehealth: Payer: Self-pay | Admitting: Family Medicine

## 2015-05-30 ENCOUNTER — Other Ambulatory Visit: Payer: Self-pay | Admitting: Family Medicine

## 2015-05-30 NOTE — Telephone Encounter (Signed)
°  Pt had two falls on 12/13. One at doctors office and other at home in bathroom. No injuries. She she had physical therapy. Possible wheel chair. Pt also needs a refill on her nyastatin.  Pt also has two pressure sores on right butt.  Please call her Macarthur Critchley at [phone], home health nurse.

## 2015-05-30 NOTE — Telephone Encounter (Signed)
Please advise 

## 2015-05-30 NOTE — Telephone Encounter (Signed)
Barnetta Chapel the home health nurse and left a voicemail. Will be calling pt daughter.

## 2015-05-30 NOTE — Telephone Encounter (Signed)
Patient needs to be seen. I will refill nystatin.  **Ashleigh, call and figure out what's going on please.

## 2015-06-03 ENCOUNTER — Telehealth: Payer: Self-pay | Admitting: Family Medicine

## 2015-06-03 ENCOUNTER — Encounter: Payer: Self-pay | Admitting: Family Medicine

## 2015-06-03 ENCOUNTER — Other Ambulatory Visit: Payer: Self-pay | Admitting: Family Medicine

## 2015-06-03 DIAGNOSIS — R296 Repeated falls: Secondary | ICD-10-CM

## 2015-06-03 NOTE — Telephone Encounter (Signed)
Called the [facility] Juliann Pulse twice and no answer so I was trying to call pt daughter who is primary care giver. Wanted to see what all had happened with the patient falling. Left a voicemail for her to call me back.

## 2015-06-03 NOTE — Telephone Encounter (Signed)
I have placed a referral for home health nursing, PT and nursing aide. Topical or oral nystatin and what for?

## 2015-06-03 NOTE — Telephone Encounter (Signed)
Patients daughter called and explained that pt is getting weaker and unable to assist with lifting. Pt daughter feels like having mini strokes again. Daughter said that there is a wheel chair in the home. Daughter wants a CNA to come into the home at least twice a week to help with bedding and bathing the pt.

## 2015-06-03 NOTE — Telephone Encounter (Signed)
Pt daughter is turning her to prevent the pressure sores and are small. Home health is dressing the wound as of last week and needs a wheelchair for the home.

## 2015-06-03 NOTE — Telephone Encounter (Signed)
This being the second call and still no response or answer from [place].

## 2015-06-03 NOTE — Telephone Encounter (Signed)
Juliann Pulse from home health is stating patient needs a rx sent over for Nystatin

## 2015-06-03 NOTE — Telephone Encounter (Signed)
Juliann Pulse from [facility] wants to know if you will also send in a referral for physical therapy so they can teach the daughter how to transfer properly to prevent pt from falling while going to the bathroom.

## 2015-06-04 ENCOUNTER — Other Ambulatory Visit: Payer: Self-pay

## 2015-06-04 MED ORDER — HYDRALAZINE HCL 50 MG PO TABS: 50.0000 mg | ORAL_TABLET | Freq: Four times a day (QID) | ORAL | Status: DC

## 2015-06-05 NOTE — Telephone Encounter (Signed)
Opened in Error.

## 2015-06-16 ENCOUNTER — Other Ambulatory Visit: Payer: Self-pay | Admitting: Family Medicine

## 2015-06-23 ENCOUNTER — Telehealth: Payer: Self-pay | Admitting: Family Medicine

## 2015-06-23 ENCOUNTER — Other Ambulatory Visit: Payer: Self-pay | Admitting: Family Medicine

## 2015-06-23 DIAGNOSIS — Z993 Dependence on wheelchair: Secondary | ICD-10-CM

## 2015-06-23 NOTE — Telephone Encounter (Signed)
Voicemail was full but [name] was called to be told orders were in.

## 2015-06-23 NOTE — Telephone Encounter (Signed)
Please advise 

## 2015-06-23 NOTE — Telephone Encounter (Signed)
Order placed

## 2015-06-23 NOTE — Telephone Encounter (Signed)
[facility] left a message with the on call service needing new PT order to evaluate and treat.

## 2015-06-23 NOTE — Telephone Encounter (Signed)
Please place orders

## 2015-06-24 ENCOUNTER — Other Ambulatory Visit: Payer: Self-pay | Admitting: Family Medicine

## 2015-06-24 MED ORDER — RANITIDINE HCL 150 MG PO CAPS: 150.0000 mg | ORAL_CAPSULE | Freq: Two times a day (BID) | ORAL | Status: DC

## 2015-06-24 MED ORDER — AMLODIPINE BESYLATE 5 MG PO TABS: ORAL_TABLET | ORAL | Status: DC

## 2015-06-24 MED ORDER — OMEPRAZOLE 20 MG PO CPDR: 20.0000 mg | DELAYED_RELEASE_CAPSULE | Freq: Every day | ORAL | Status: DC

## 2015-06-24 MED ORDER — FLUOCINOLONE ACETONIDE 0.01 % EX SHAM: MEDICATED_SHAMPOO | CUTANEOUS | Status: DC

## 2015-06-24 MED ORDER — CLOPIDOGREL BISULFATE 75 MG PO TABS: 75.0000 mg | ORAL_TABLET | Freq: Every day | ORAL | Status: DC

## 2015-06-24 MED ORDER — HYDRALAZINE HCL 50 MG PO TABS: 50.0000 mg | ORAL_TABLET | Freq: Four times a day (QID) | ORAL | Status: DC

## 2015-06-24 MED ORDER — BD SWAB SINGLE USE REGULAR PADS: MEDICATED_PAD | Status: DC

## 2015-06-24 NOTE — Telephone Encounter (Signed)
Voicemail was left about PT orders placed.

## 2015-06-24 NOTE — Telephone Encounter (Signed)
Please advise 

## 2015-06-28 ENCOUNTER — Other Ambulatory Visit: Payer: Self-pay | Admitting: Family Medicine

## 2015-07-01 ENCOUNTER — Telehealth: Payer: Self-pay | Admitting: *Deleted

## 2015-07-01 NOTE — Telephone Encounter (Signed)
Mendel Ryder from [facility] stated that patients daughter witness mucus and constipation from patient. She would like a recommendation on a stool softer that would help the situation. Contact Mendel Ryder [phone]

## 2015-07-01 NOTE — Telephone Encounter (Signed)
Patients daughter was given the direction and told her the patient upcoming appointment that she asked for.

## 2015-07-01 NOTE — Telephone Encounter (Signed)
Miralax once or twice daily (17 g) or Colace nightly.

## 2015-07-01 NOTE — Telephone Encounter (Signed)
Please advise./tvw 

## 2015-07-02 ENCOUNTER — Telehealth: Payer: Self-pay | Admitting: Family Medicine

## 2015-07-02 NOTE — Telephone Encounter (Signed)
FYI

## 2015-07-02 NOTE — Telephone Encounter (Signed)
[place] called from [facility] regarding pt fell yesterday during going to the bathroom. Pt is ok pt knee is sore and tender. He just wanted to give a heads up. Thank You!

## 2015-07-08 ENCOUNTER — Other Ambulatory Visit: Payer: Self-pay | Admitting: Family Medicine

## 2015-07-08 MED ORDER — AMLODIPINE BESYLATE 5 MG PO TABS: ORAL_TABLET | ORAL | Status: DC

## 2015-07-08 MED ORDER — BD SWAB SINGLE USE REGULAR PADS: MEDICATED_PAD | Status: DC

## 2015-07-08 MED ORDER — FLUOCINOLONE ACETONIDE 0.01 % EX SHAM: MEDICATED_SHAMPOO | CUTANEOUS | Status: DC

## 2015-07-08 MED ORDER — CLOPIDOGREL BISULFATE 75 MG PO TABS: 75.0000 mg | ORAL_TABLET | Freq: Every day | ORAL | Status: DC

## 2015-07-08 MED ORDER — RANITIDINE HCL 150 MG PO CAPS: 150.0000 mg | ORAL_CAPSULE | Freq: Two times a day (BID) | ORAL | Status: DC

## 2015-07-08 MED ORDER — OMEPRAZOLE 20 MG PO CPDR: 20.0000 mg | DELAYED_RELEASE_CAPSULE | Freq: Every day | ORAL | Status: DC

## 2015-07-08 MED ORDER — EXEMESTANE 25 MG PO TABS: 25.0000 mg | ORAL_TABLET | Freq: Every day | ORAL | Status: DC

## 2015-07-08 MED ORDER — HYDRALAZINE HCL 50 MG PO TABS: 50.0000 mg | ORAL_TABLET | Freq: Four times a day (QID) | ORAL | Status: DC

## 2015-07-08 NOTE — Telephone Encounter (Signed)
Medication were sent to the wrong pharmacy. They are now being sent to [facility].

## 2015-07-08 NOTE — Telephone Encounter (Signed)
It needs to be sent to [facility] and i changed to 90 days with no refills. This medication is a historical medication. Please advise?

## 2015-07-09 NOTE — Telephone Encounter (Signed)
Given age and declining status (including falls), I would prefer that the patient not be on this.  If she has been taking this regularly, we should taper off.

## 2015-07-09 NOTE — Telephone Encounter (Signed)
Patients daughter being her care giver was told to taper her off this medication by given half a tab for the next week and to stop the medication after that

## 2015-07-10 ENCOUNTER — Inpatient Hospital Stay (HOSPITAL_BASED_OUTPATIENT_CLINIC_OR_DEPARTMENT_OTHER): Payer: Medicare PPO

## 2015-07-10 ENCOUNTER — Other Ambulatory Visit: Payer: Self-pay

## 2015-07-10 ENCOUNTER — Inpatient Hospital Stay: Payer: Medicare PPO | Attending: Oncology | Admitting: Oncology

## 2015-07-10 ENCOUNTER — Telehealth: Payer: Self-pay | Admitting: Family Medicine

## 2015-07-10 ENCOUNTER — Ambulatory Visit: Payer: Self-pay | Admitting: Oncology

## 2015-07-10 VITALS — BP 131/70 | HR 94 | Temp 97.2°F | Resp 18

## 2015-07-10 DIAGNOSIS — Z8673 Personal history of transient ischemic attack (TIA), and cerebral infarction without residual deficits: Secondary | ICD-10-CM | POA: Insufficient documentation

## 2015-07-10 DIAGNOSIS — I129 Hypertensive chronic kidney disease with stage 1 through stage 4 chronic kidney disease, or unspecified chronic kidney disease: Secondary | ICD-10-CM | POA: Insufficient documentation

## 2015-07-10 DIAGNOSIS — M1990 Unspecified osteoarthritis, unspecified site: Secondary | ICD-10-CM | POA: Insufficient documentation

## 2015-07-10 DIAGNOSIS — Z79811 Long term (current) use of aromatase inhibitors: Secondary | ICD-10-CM | POA: Diagnosis not present

## 2015-07-10 DIAGNOSIS — Z853 Personal history of malignant neoplasm of breast: Secondary | ICD-10-CM | POA: Diagnosis not present

## 2015-07-10 DIAGNOSIS — C50912 Malignant neoplasm of unspecified site of left female breast: Secondary | ICD-10-CM | POA: Diagnosis present

## 2015-07-10 DIAGNOSIS — E785 Hyperlipidemia, unspecified: Secondary | ICD-10-CM | POA: Insufficient documentation

## 2015-07-10 DIAGNOSIS — Z170 Estrogen receptor positive status [ER+]: Secondary | ICD-10-CM | POA: Diagnosis not present

## 2015-07-10 DIAGNOSIS — N183 Chronic kidney disease, stage 3 (moderate): Secondary | ICD-10-CM | POA: Diagnosis not present

## 2015-07-10 DIAGNOSIS — Z79899 Other long term (current) drug therapy: Secondary | ICD-10-CM | POA: Diagnosis not present

## 2015-07-10 DIAGNOSIS — Z86718 Personal history of other venous thrombosis and embolism: Secondary | ICD-10-CM | POA: Diagnosis not present

## 2015-07-10 DIAGNOSIS — E119 Type 2 diabetes mellitus without complications: Secondary | ICD-10-CM | POA: Diagnosis not present

## 2015-07-10 DIAGNOSIS — C50112 Malignant neoplasm of central portion of left female breast: Secondary | ICD-10-CM

## 2015-07-10 DIAGNOSIS — Z9011 Acquired absence of right breast and nipple: Secondary | ICD-10-CM | POA: Insufficient documentation

## 2015-07-10 DIAGNOSIS — Z794 Long term (current) use of insulin: Secondary | ICD-10-CM | POA: Diagnosis not present

## 2015-07-10 LAB — CBC WITH DIFFERENTIAL/PLATELET
BASOS ABS: 0.1 10*3/uL (ref 0–0.1)
Eosinophils Absolute: 0.2 10*3/uL (ref 0–0.7)
HCT: 35.8 % (ref 35.0–47.0)
Hemoglobin: 11.9 g/dL — ABNORMAL LOW (ref 12.0–16.0)
Lymphs Abs: 3.8 10*3/uL — ABNORMAL HIGH (ref 1.0–3.6)
MCH: 29.4 pg (ref 26.0–34.0)
MCHC: 33.2 g/dL (ref 32.0–36.0)
MCV: 88.6 fL (ref 80.0–100.0)
Monocytes Absolute: 0.6 10*3/uL (ref 0.2–0.9)
Monocytes Relative: 6 %
Neutro Abs: 5.3 10*3/uL (ref 1.4–6.5)
Neutrophils Relative %: 53 %
PLATELETS: 428 10*3/uL (ref 150–440)
RBC: 4.04 MIL/uL (ref 3.80–5.20)
RDW: 17 % — ABNORMAL HIGH (ref 11.5–14.5)
WBC: 9.9 10*3/uL (ref 3.6–11.0)

## 2015-07-10 LAB — COMPREHENSIVE METABOLIC PANEL
ALBUMIN: 3 g/dL — AB (ref 3.5–5.0)
ALT: 12 U/L — AB (ref 14–54)
AST: 18 U/L (ref 15–41)
Alkaline Phosphatase: 78 U/L (ref 38–126)
Anion gap: 7 (ref 5–15)
BUN: 21 mg/dL — AB (ref 6–20)
CHLORIDE: 104 mmol/L (ref 101–111)
CO2: 25 mmol/L (ref 22–32)
CREATININE: 0.95 mg/dL (ref 0.44–1.00)
Calcium: 8.9 mg/dL (ref 8.9–10.3)
GFR calc Af Amer: 59 mL/min — ABNORMAL LOW (ref >60)
GFR, EST NON AFRICAN AMERICAN: 51 mL/min — AB (ref >60)
GLUCOSE: 226 mg/dL — AB (ref 65–99)
Potassium: 3.7 mmol/L (ref 3.5–5.1)
SODIUM: 136 mmol/L (ref 135–145)
Total Bilirubin: 0.4 mg/dL (ref 0.3–1.2)
Total Protein: 6.1 g/dL — ABNORMAL LOW (ref 6.5–8.1)

## 2015-07-10 NOTE — Telephone Encounter (Signed)
Remo Lipps called from [place] regarding pt refills for Clonazepam .5 mg, accucheck test strips, accusoft click lancets, accumeter,control solution. Pharmacy is [facility]. Thank you!

## 2015-07-10 NOTE — Progress Notes (Signed)
[place]  [phone]  Fax:([phone]) [phone]     [name] DOB: 1922/05/17  MR#: [id number]  KDT#:[id number]  Patient Care Team: Coral Spikes, DO as PCP - General (Family Medicine)  CHIEF COMPLAINT:  Chief Complaint  Patient presents with  . Breast Cancer    INTERVAL HISTORY:  Patient is here for further follow-up regarding carcinoma of left breast. Patient was originally diagnosed in October 2015 in [place]. She has since relocated to [place] and established care. Patient also has previous history of carcinoma of right breast over 10 years ago, she is status post mastectomy. Patient is currently on Aromasin daily and tolerating very well. She most recently had a mammogram in October 2016, that reported the previously known malignancy and also noted to be slightly less pronounced. Patient is in wheelchair today due to weakness and fatigue related to age. She otherwise feels very well and denies any acute complaints.  REVIEW OF SYSTEMS:   Review of Systems  Constitutional: Positive for malaise/fatigue. Negative for fever, chills, weight loss and diaphoresis.       Related to age per pt  HENT: Negative.   Eyes: Negative.   Respiratory: Negative for cough, hemoptysis, sputum production, shortness of breath and wheezing.   Cardiovascular: Negative for chest pain, palpitations, orthopnea, claudication, leg swelling and PND.  Gastrointestinal: Negative for heartburn, nausea, vomiting, abdominal pain, diarrhea, constipation, blood in stool and melena.  Genitourinary: Negative.   Musculoskeletal: Negative.   Skin: Negative.   Neurological: Positive for weakness. Negative for dizziness, tingling, focal weakness and seizures.  Endo/Heme/Allergies: Does not bruise/bleed easily.  Psychiatric/Behavioral: Negative for depression. The patient is not nervous/anxious and does not have insomnia.     As per HPI. Otherwise, a complete review of systems is  negatve.  PAST MEDICAL HISTORY: Past Medical History  Diagnosis Date  . Hyperlipidemia   . Hypertension   . History of DVT (deep vein thrombosis) 1980's    BLE  . Type II diabetes mellitus (HCC)     Insulin dependent.   . Decubitus ulcer, buttock   . TIA (transient ischemic attack) 02/18/2015  . Arthritis     "all over"  . Breast cancer ([place])     Hx of R breast cancer s/p mastectomy; Currently has L breast cancer (Diagnosed 2015).   . Glaucoma, bilateral   . Essential hypertension 01/01/2015  . Decubitus ulcer of sacral region, stage 2 01/01/2015  . Osteoarthritis 01/01/2015  . Itchy scalp 02/10/2015  . CKD (chronic kidney disease), stage III 02/18/2015  . Recurrent UTI 03/06/2015  . Diabetes mellitus with insulin therapy ([place]) 01/01/2015  . HLD (hyperlipidemia) 01/01/2015  . History of breast cancer 01/01/2015  . Breast cancer, left breast ([place]) 01/01/2015  . Glaucoma 01/01/2015  . Anxiety state 01/01/2015  . Right knee pain 02/10/2015    PAST SURGICAL HISTORY: Past Surgical History  Procedure Laterality Date  . Cholecystectomy    . Medial partial knee replacement Right 1980's?  . Mastectomy Right 1980's  . Abdominal hysterectomy    . Breast biopsy Right 1980's    +  . Breast excisional biopsy Left 2015    + stereo no surgery  . Peripheral vascular catheterization  04/28/2015    Procedure: Lower Extremity Intervention;  Surgeon: Algernon Huxley, MD;  Location: [facility];  Service: Cardiovascular;;  . Peripheral vascular catheterization N/A 04/28/2015    Procedure: Abdominal Aortogram w/Lower Extremity;  Surgeon: Algernon Huxley, MD;  Location: [facility]  INVASIVE CV LAB;  Service: Cardiovascular;  Laterality: N/A;    FAMILY HISTORY Family History  Problem Relation Age of Onset  . Diabetes Son   . Diabetes Son   . Heart disease Mother   . Kidney cancer Neg Hx   . Kidney disease Son   . Bladder Cancer Neg Hx   . Prostate cancer Neg Hx     GYNECOLOGIC HISTORY:  No LMP  recorded. Patient has had a hysterectomy.     ADVANCED DIRECTIVES:    HEALTH MAINTENANCE: Social History  Substance Use Topics  . Smoking status: Never Smoker   . Smokeless tobacco: Never Used  . Alcohol Use: No    No Known Allergies  Current Outpatient Prescriptions  Medication Sig Dispense Refill  . Alcohol Swabs (B-D SINGLE USE SWABS REGULAR) PADS Use as directed. 100 each 11  . ALPHAGAN P 0.1 % SOLN PLACE 1 DROP INTO BOTH EYES 2 (TWO) TIMES DAILY. 5 mL 1  . amLODipine (NORVASC) 5 MG tablet TAKE 1 TABLET (5 MG TOTAL) BY MOUTH DAILY. 90 tablet 2  . B-D UF III MINI PEN NEEDLES 31G X 5 MM MISC     . benzonatate (TESSALON) 100 MG capsule Take 1 capsule (100 mg total) by mouth 3 (three) times daily as needed. 30 capsule 0  . bimatoprost (LUMIGAN) 0.01 % SOLN Place 1 drop into both eyes at bedtime.    . Calcium Carb-Cholecalciferol (CALCIUM 600 + D PO) Take 1 tablet by mouth daily.     . chlorpheniramine-HYDROcodone (TUSSIONEX PENNKINETIC ER) 10-8 MG/5ML SUER Take 5 mLs by mouth every 12 (twelve) hours as needed for cough. 115 mL 0  . Cholecalciferol (VITAMIN D3) 1000 UNITS CAPS Take 1 capsule by mouth daily.     . clonazePAM (KLONOPIN) 0.5 MG tablet Take 0.5 mg by mouth at bedtime.     . clopidogrel (PLAVIX) 75 MG tablet Take 1 tablet (75 mg total) by mouth daily. 90 tablet 2  . CRANBERRY PO Take 500 mg by mouth 2 (two) times daily.     Marland Kitchen exemestane (AROMASIN) 25 MG tablet Take 1 tablet (25 mg total) by mouth at bedtime. 30 tablet 6  . fluconazole (DIFLUCAN) 200 MG tablet TAKE 1 TABLET (200 MG TOTAL) BY MOUTH DAILY.  0  . Fluocinolone Acetonide 0.01 % SHAM Apply to scalp once daily; work into [employer] and allow to remain on scalp for ~5 minutes then rinse with water. 120 mL 1  . hydrALAZINE (APRESOLINE) 50 MG tablet Take 1 tablet (50 mg total) by mouth 4 (four) times daily. 90 tablet 2  . HYDROcodone-acetaminophen (NORCO/VICODIN) 5-325 MG tablet Take 1 tablet by mouth every 6 (six)  hours as needed for moderate pain. 30 tablet 0  . insulin aspart protamine - aspart (NOVOLOG MIX 70/30 FLEXPEN) (70-30) 100 UNIT/ML FlexPen Inject 0.35 mLs (35 Units total) into the skin 2 (two) times daily. 15 mL 11  . Insulin Lispro Prot & Lispro (HUMALOG MIX 75/25 KWIKPEN) (75-25) 100 UNIT/ML Kwikpen Inject 25 Units into the skin daily as needed.    Marland Kitchen lisinopril (PRINIVIL,ZESTRIL) 20 MG tablet Take 20 mg by mouth 2 (two) times daily.    . Multiple Vitamins-Minerals (MULTIVITAMIN GUMMIES ADULT PO) Take 1 tablet by mouth daily.     Marland Kitchen omeprazole (PRILOSEC) 20 MG capsule Take 1 capsule (20 mg total) by mouth daily. 90 capsule 2  . predniSONE (DELTASONE) 50 MG tablet 1 tablet daily x 5 days. 5 tablet 0  . ranitidine (  ZANTAC) 150 MG capsule Take 1 capsule (150 mg total) by mouth 2 (two) times daily. 90 capsule 2  . TRUEPLUS SAFETY LANCETS 28G MISC     . TRUETEST TEST test strip      No current facility-administered medications for this visit.    OBJECTIVE: BP 131/70 mmHg  Pulse 94  Temp(Src) 97.2 F (36.2 C) (Tympanic)  Resp 18  Wt    There is no weight on file to calculate BMI.    ECOG FS:2 - Symptomatic, <50% confined to bed  General: Well-developed, well-nourished, no acute distress. Sitting in wheelchair. Eyes: Pink conjunctiva, anicteric sclera. HEENT: Normocephalic, moist mucous membranes, clear oropharnyx. Lungs: Clear to auscultation bilaterally. Heart: Irregular rate and rhythm. No rubs, murmurs, or gallops. Abdomen: Soft, nontender, nondistended. No organomegaly noted, normoactive bowel sounds. Breast: Right mastectomy, chest wall free of masses. Left breast palpated in a circular manner in the sitting and supine positions.  Small palpable mass near the 6 oclock position, similar in size as previously described.  Axilla palpated in both positions with no masses or fullness palpated.  Musculoskeletal: No edema, cyanosis, or clubbing. Neuro: Alert, answering all questions  appropriately. Cranial nerves grossly intact. Skin: No rashes or petechiae noted. Psych: Normal affect. Lymphatics: No cervical, clavicular, axillary LAD.   LAB RESULTS:  Appointment on 07/10/2015  Component Date Value Ref Range Status  . WBC 07/10/2015 9.9  3.6 - 11.0 K/uL Final  . RBC 07/10/2015 4.04  3.80 - 5.20 MIL/uL Final  . Hemoglobin 07/10/2015 11.9* 12.0 - 16.0 g/dL Final  . HCT 07/10/2015 35.8  35.0 - 47.0 % Final  . MCV 07/10/2015 88.6  80.0 - 100.0 fL Final  . MCH 07/10/2015 29.4  26.0 - 34.0 pg Final  . MCHC 07/10/2015 33.2  32.0 - 36.0 g/dL Final  . RDW 07/10/2015 17.0* 11.5 - 14.5 % Final  . Platelets 07/10/2015 428  150 - 440 K/uL Final  . Neutrophils Relative % 07/10/2015 53%   Final  . Neutro Abs 07/10/2015 5.3  1.4 - 6.5 K/uL Final  . Lymphocytes Relative 07/10/2015 38%   Final  . Lymphs Abs 07/10/2015 3.8* 1.0 - 3.6 K/uL Final  . Monocytes Relative 07/10/2015 6%   Final  . Monocytes Absolute 07/10/2015 0.6  0.2 - 0.9 K/uL Final  . Eosinophils Relative 07/10/2015 2%   Final  . Eosinophils Absolute 07/10/2015 0.2  0 - 0.7 K/uL Final  . Basophils Relative 07/10/2015 1%   Final  . Basophils Absolute 07/10/2015 0.1  0 - 0.1 K/uL Final  . Sodium 07/10/2015 136  135 - 145 mmol/L Final  . Potassium 07/10/2015 3.7  3.5 - 5.1 mmol/L Final  . Chloride 07/10/2015 104  101 - 111 mmol/L Final  . CO2 07/10/2015 25  22 - 32 mmol/L Final  . Glucose, Bld 07/10/2015 226* 65 - 99 mg/dL Final  . BUN 07/10/2015 21* 6 - 20 mg/dL Final  . Creatinine, Ser 07/10/2015 0.95  0.44 - 1.00 mg/dL Final  . Calcium 07/10/2015 8.9  8.9 - 10.3 mg/dL Final  . Total Protein 07/10/2015 6.1* 6.5 - 8.1 g/dL Final  . Albumin 07/10/2015 3.0* 3.5 - 5.0 g/dL Final  . AST 07/10/2015 18  15 - 41 U/L Final  . ALT 07/10/2015 12* 14 - 54 U/L Final  . Alkaline Phosphatase 07/10/2015 78  38 - 126 U/L Final  . Total Bilirubin 07/10/2015 0.4  0.3 - 1.2 mg/dL Final  . GFR calc non Af Amer 07/10/2015 51* >  60  mL/min Final  . GFR calc Af Amer 07/10/2015 59* >60 mL/min Final   Comment: (NOTE) The eGFR has been calculated using the CKD EPI equation. This calculation has not been validated in all clinical situations. eGFR's persistently <60 mL/min signify possible Chronic Kidney Disease.   . Anion gap 07/10/2015 7  5 - 15 Final    STUDIES: No results found.  ASSESSMENT:  Carcinoma of left breast.  PLAN:   1. Left breast cancer. Patient was originally diagnosed in [place], following biopsy. At the time of diagnosis patient felt that her age and performance status was not optimal for excision. She has since relocated to [facility] and established care with Dr. Oliva Bustard.   Last mammogram was performed in October 2016 , reported as BI-RADS 6, consistent with previously diagnosed malignancy. We will continue with Aromasin daily. Patient will be due for yearly diagnostic mammogram in October 2017.  We'll continue with routine follow-up in approximately 6 months.  Patient expressed understanding and was in agreement with this plan. She also understands that She can call clinic at any time with any questions, concerns, or complaints.   Dr. Oliva Bustard was available for consultation and review of plan of care for this patient.   Evlyn Kanner, NP   07/10/2015 11:55 AM

## 2015-07-11 ENCOUNTER — Encounter: Payer: Self-pay | Admitting: Family Medicine

## 2015-07-11 ENCOUNTER — Ambulatory Visit (INDEPENDENT_AMBULATORY_CARE_PROVIDER_SITE_OTHER): Payer: Medicare PPO | Admitting: Family Medicine

## 2015-07-11 VITALS — BP 130/78 | HR 94 | Temp 97.8°F | Ht 63.0 in | Wt 176.5 lb

## 2015-07-11 DIAGNOSIS — F411 Generalized anxiety disorder: Secondary | ICD-10-CM

## 2015-07-11 DIAGNOSIS — L89152 Pressure ulcer of sacral region, stage 2: Secondary | ICD-10-CM | POA: Diagnosis not present

## 2015-07-11 DIAGNOSIS — R296 Repeated falls: Secondary | ICD-10-CM | POA: Diagnosis not present

## 2015-07-11 DIAGNOSIS — I10 Essential (primary) hypertension: Secondary | ICD-10-CM

## 2015-07-11 NOTE — Patient Instructions (Addendum)
Continue her current medications.  Please call with any concerns/issues.  Please let me know if you need any additional help.   Follow up in 3-6 months.  Take care  Dr. Lacinda Axon

## 2015-07-11 NOTE — Progress Notes (Signed)
Pre visit review using our clinic review tool, if applicable. No additional management support is needed unless otherwise documented below in the visit note. 

## 2015-07-11 NOTE — Assessment & Plan Note (Signed)
Stable. Continue off loading pressure. Home health to continue coming out.

## 2015-07-11 NOTE — Assessment & Plan Note (Signed)
Patient states that she is doing well. Tapering Klonopin by 1/2 x 1 week then stop.

## 2015-07-11 NOTE — Assessment & Plan Note (Signed)
Well controlled. Continue Norvasc, Hydralazine and Lisinopril.

## 2015-07-11 NOTE — Progress Notes (Signed)
   Subjective:  Patient ID: [name], female    DOB: 08-25-1921  Age: 82 y.o. MRN: [id number]  CC: Follow up  HPI:  82 year old female with a complicated PMH presents for follow up.  Falls  Patient has been experiencing frequent falls as of late.  Her daughter is the primary caregiver.  Falls were attributed to balance issues (from pain/severe OA).  She has had home health PT and is doing well at this time.  Anxiety  Tapering Klonopin with the plan to discontinue given falls.  Sacral Decubitus ulcer  Stable per daughter.  She and home health RN are tending to the wound.  HTN  Well controlled on Hydralazine, Lisinopril, Norvasc.  Social Hx   Social History   Social History  . Marital Status: Widowed    Spouse Name: N/A  . Number of Children: N/A  . Years of Education: N/A   Social History Main Topics  . Smoking status: Never Smoker   . Smokeless tobacco: Never Used  . Alcohol Use: No  . Drug Use: No  . Sexual Activity: Not Asked   Other Topics Concern  . None   Social History Narrative   Lives with daughter/daughter's husband.   Denita Lung [phone]).    Review of Systems  Skin: Positive for wound.  Neurological: Positive for weakness.   Objective:  BP 130/78 mmHg  Pulse 94  Temp(Src) 97.8 F (36.6 C) (Oral)  Ht 5\' 3"  (1.6 m)  Wt 176 lb 8 oz (80.06 kg)  BMI 31.27 kg/m2  SpO2 98%  BP/Weight 07/11/2015 07/10/2015 [id number]  Systolic BP [id number]  Diastolic BP 78 70 82  Wt. (Lbs) 176.5 - 183.13  BMI 31.27 - 32.45   Physical Exam  Constitutional: She appears well-developed. No distress.  Pulmonary/Chest: Effort normal.  Neurological: She is alert.  Skin:  Sacrum with 2 small ulcerations - Stage 2. No drainage or surrounding erythema. Appear to be healing well.  Psychiatric: She has a normal mood and affect.  Vitals reviewed.   Lab Results  Component Value Date   WBC 9.9 07/10/2015   HGB 11.9* 07/10/2015   HCT 35.8  07/10/2015   PLT 428 07/10/2015   GLUCOSE 226* 07/10/2015   CHOL 84 02/19/2015   TRIG 88 02/19/2015   HDL 27* 02/19/2015   LDLCALC 39 02/19/2015   ALT 12* 07/10/2015   AST 18 07/10/2015   NA 136 07/10/2015   K 3.7 07/10/2015   CL 104 07/10/2015   CREATININE 0.95 07/10/2015   BUN 21* 07/10/2015   CO2 25 07/10/2015   TSH 6.97* 03/01/2012   HGBA1C 7.4* 02/19/2015    Assessment & Plan:   Problem List Items Addressed This Visit    Anxiety state - Primary    Patient states that she is doing well. Tapering Klonopin by 1/2 x 1 week then stop.      Decubitus ulcer of sacral region, stage 2    Stable. Continue off loading pressure. Home health to continue coming out.      Essential hypertension    Well controlled. Continue Norvasc, Hydralazine and Lisinopril.       Frequent falls    Patient doing well currently after having PT.         Follow-up: Return for 3-6 months, Follow up Chronic medical issues.  [place]

## 2015-07-12 NOTE — Assessment & Plan Note (Signed)
Patient doing well currently after having PT.

## 2015-07-14 MED ORDER — BD SWAB SINGLE USE REGULAR PADS: MEDICATED_PAD | Status: DC

## 2015-07-14 MED ORDER — TRUETEST TEST VI STRP: ORAL_STRIP | Status: DC

## 2015-07-28 ENCOUNTER — Emergency Department (HOSPITAL_COMMUNITY): Payer: Medicare PPO

## 2015-07-28 ENCOUNTER — Telehealth: Payer: Self-pay | Admitting: Family Medicine

## 2015-07-28 ENCOUNTER — Encounter (HOSPITAL_COMMUNITY): Payer: Self-pay | Admitting: Emergency Medicine

## 2015-07-28 ENCOUNTER — Emergency Department (HOSPITAL_COMMUNITY)
Admission: EM | Admit: 2015-07-28 | Discharge: 2015-07-28 | Disposition: A | Discharge status: Home / Self Care | Payer: Medicare PPO | Attending: Emergency Medicine | Admitting: Emergency Medicine

## 2015-07-28 DIAGNOSIS — Z86718 Personal history of other venous thrombosis and embolism: Secondary | ICD-10-CM | POA: Insufficient documentation

## 2015-07-28 DIAGNOSIS — R531 Weakness: Secondary | ICD-10-CM | POA: Diagnosis not present

## 2015-07-28 DIAGNOSIS — Z87440 Personal history of urinary (tract) infections: Secondary | ICD-10-CM | POA: Insufficient documentation

## 2015-07-28 DIAGNOSIS — Z8659 Personal history of other mental and behavioral disorders: Secondary | ICD-10-CM | POA: Diagnosis not present

## 2015-07-28 DIAGNOSIS — E785 Hyperlipidemia, unspecified: Secondary | ICD-10-CM | POA: Insufficient documentation

## 2015-07-28 DIAGNOSIS — Z79899 Other long term (current) drug therapy: Secondary | ICD-10-CM | POA: Insufficient documentation

## 2015-07-28 DIAGNOSIS — E119 Type 2 diabetes mellitus without complications: Secondary | ICD-10-CM | POA: Diagnosis not present

## 2015-07-28 DIAGNOSIS — H409 Unspecified glaucoma: Secondary | ICD-10-CM | POA: Insufficient documentation

## 2015-07-28 DIAGNOSIS — I129 Hypertensive chronic kidney disease with stage 1 through stage 4 chronic kidney disease, or unspecified chronic kidney disease: Secondary | ICD-10-CM | POA: Diagnosis not present

## 2015-07-28 DIAGNOSIS — Z8739 Personal history of other diseases of the musculoskeletal system and connective tissue: Secondary | ICD-10-CM | POA: Diagnosis not present

## 2015-07-28 DIAGNOSIS — R0602 Shortness of breath: Secondary | ICD-10-CM | POA: Insufficient documentation

## 2015-07-28 DIAGNOSIS — N183 Chronic kidney disease, stage 3 (moderate): Secondary | ICD-10-CM | POA: Diagnosis not present

## 2015-07-28 DIAGNOSIS — Z872 Personal history of diseases of the skin and subcutaneous tissue: Secondary | ICD-10-CM | POA: Diagnosis not present

## 2015-07-28 DIAGNOSIS — Z794 Long term (current) use of insulin: Secondary | ICD-10-CM | POA: Insufficient documentation

## 2015-07-28 DIAGNOSIS — Z7902 Long term (current) use of antithrombotics/antiplatelets: Secondary | ICD-10-CM | POA: Insufficient documentation

## 2015-07-28 DIAGNOSIS — Z853 Personal history of malignant neoplasm of breast: Secondary | ICD-10-CM | POA: Insufficient documentation

## 2015-07-28 LAB — CBC WITH DIFFERENTIAL/PLATELET
BASOS PCT: 0 %
Basophils Absolute: 0 10*3/uL (ref 0.0–0.1)
Eosinophils Absolute: 0.1 10*3/uL (ref 0.0–0.7)
Eosinophils Relative: 1 %
HEMATOCRIT: 37.7 % (ref 36.0–46.0)
HEMOGLOBIN: 12.2 g/dL (ref 12.0–15.0)
LYMPHS ABS: 3.9 10*3/uL (ref 0.7–4.0)
LYMPHS PCT: 33 %
MCH: 28.6 pg (ref 26.0–34.0)
MCHC: 32.4 g/dL (ref 30.0–36.0)
MCV: 88.3 fL (ref 78.0–100.0)
MONO ABS: 0.8 10*3/uL (ref 0.1–1.0)
MONOS PCT: 7 %
NEUTROS ABS: 7 10*3/uL (ref 1.7–7.7)
NEUTROS PCT: 59 %
Platelets: 510 10*3/uL — ABNORMAL HIGH (ref 150–400)
RBC: 4.27 MIL/uL (ref 3.87–5.11)
RDW: 16.2 % — ABNORMAL HIGH (ref 11.5–15.5)
WBC: 11.8 10*3/uL — ABNORMAL HIGH (ref 4.0–10.5)

## 2015-07-28 LAB — COMPREHENSIVE METABOLIC PANEL
ALBUMIN: 2.8 g/dL — AB (ref 3.5–5.0)
ALK PHOS: 73 U/L (ref 38–126)
ALT: 12 U/L — ABNORMAL LOW (ref 14–54)
ANION GAP: 10 (ref 5–15)
AST: 23 U/L (ref 15–41)
BILIRUBIN TOTAL: 0.5 mg/dL (ref 0.3–1.2)
BUN: 14 mg/dL (ref 6–20)
CALCIUM: 9.2 mg/dL (ref 8.9–10.3)
CO2: 22 mmol/L (ref 22–32)
Chloride: 106 mmol/L (ref 101–111)
Creatinine, Ser: 0.7 mg/dL (ref 0.44–1.00)
GFR calc Af Amer: >60 mL/min (ref >60)
GLUCOSE: 216 mg/dL — AB (ref 65–99)
Potassium: 4 mmol/L (ref 3.5–5.1)
Sodium: 138 mmol/L (ref 135–145)
TOTAL PROTEIN: 5.8 g/dL — AB (ref 6.5–8.1)

## 2015-07-28 LAB — URINALYSIS, ROUTINE W REFLEX MICROSCOPIC
BILIRUBIN URINE: NEGATIVE
GLUCOSE, UA: 100 mg/dL — AB
Hgb urine dipstick: NEGATIVE
KETONES UR: NEGATIVE mg/dL
Leukocytes, UA: NEGATIVE
NITRITE: NEGATIVE
PH: 5.5 (ref 5.0–8.0)
Protein, ur: NEGATIVE mg/dL
Specific Gravity, Urine: 1.018 (ref 1.005–1.030)

## 2015-07-28 LAB — TROPONIN I

## 2015-07-28 MED ORDER — SODIUM CHLORIDE 0.9 % IV BOLUS (SEPSIS)
1000.0000 mL | Freq: Once | INTRAVENOUS | Status: AC
  Administered 2015-07-28: 1000 mL via INTRAVENOUS

## 2015-07-28 NOTE — Telephone Encounter (Signed)
Patient Name: JENEAN PEREZLOPEZ DOB: [birth year] Initial Comment Caller states her mother is complaining her left side is heavy, and she's feeling weak. Feeling tingling. Nurse Assessment Nurse: Ronnald Ramp, RN, Miranda Date/Time (Eastern Time): 07/28/2015 9:09:12 AM Confirm and document reason for call. If symptomatic, describe symptoms. You must click the next button to save text entered. ---Caller states her mother is having heaviness and tingling in the right side of her body off and on for the last couple of weeks. She is c/o weakness and "feeling sick inside". She is currently having symptoms for about 1 hr. Has the patient traveled out of the country within the last 30 days? ---Not Applicable Does the patient have any new or worsening symptoms? ---Yes Will a triage be completed? ---Yes Related visit to physician within the last 2 weeks? ---No Does the PT have any chronic conditions? (i.e. diabetes, asthma, etc.) ---Yes List chronic conditions. ---Diabetes, Heart disease, HTN, hx of TIA Is this a behavioral health or substance abuse call? ---No Guidelines Guideline Title Affirmed Question Affirmed Notes Neurologic Deficit [1] SEVERE weakness (i.e., unable to walk or barely able to walk, requires support) AND [2] new onset or worsening Final Disposition User Call EMS 911 Now Ronnald Ramp, RN, Miranda Referrals GO TO [place] UNDECIDED Disagree/Comply: Comply

## 2015-07-28 NOTE — ED Notes (Signed)
Patient transported to X-ray 

## 2015-07-28 NOTE — Telephone Encounter (Signed)
Please advise 

## 2015-07-28 NOTE — ED Provider Notes (Signed)
CSN: [id number]     Arrival date & time 07/28/15  1141 History   First MD Initiated Contact with Patient 07/28/15 1141     Chief Complaint  Patient presents with  . Weakness     (Consider location/radiation/quality/duration/timing/severity/associated sxs/prior Treatment) HPI Patient with generalized weakness over the past 24 hours.  Patient has had a prior stroke.  At times the patient reports that her right arm feels slightly weaker however then she reports that the weakness radiates over towards her left side she becomes weak all over.  No fevers or chills.  Family reports mild decreased oral intake.  No reports of chest pain shortness of breath.  She denies abdominal pain.  No urinary complaints.  Family states that over the past month she has been slowly declining in working with physical therapy and a home health nurse but continues to become more fragile and is requiring more assistance for ambulation.   Past Medical History  Diagnosis Date  . Hyperlipidemia   . Hypertension   . History of DVT (deep vein thrombosis) 1980's    BLE  . Type II diabetes mellitus (HCC)     Insulin dependent.   . Decubitus ulcer, buttock   . TIA (transient ischemic attack) 02/18/2015  . Arthritis     "all over"  . Breast cancer ([place])     Hx of R breast cancer s/p mastectomy; Currently has L breast cancer (Diagnosed 2015).   . Glaucoma, bilateral   . Essential hypertension 01/01/2015  . Decubitus ulcer of sacral region, stage 2 01/01/2015  . Osteoarthritis 01/01/2015  . Itchy scalp 02/10/2015  . CKD (chronic kidney disease), stage III 02/18/2015  . Recurrent UTI 03/06/2015  . Diabetes mellitus with insulin therapy ([place]) 01/01/2015  . HLD (hyperlipidemia) 01/01/2015  . History of breast cancer 01/01/2015  . Breast cancer, left breast ([place]) 01/01/2015  . Glaucoma 01/01/2015  . Anxiety state 01/01/2015  . Right knee pain 02/10/2015   Past Surgical History  Procedure Laterality Date  . Cholecystectomy    .  Medial partial knee replacement Right 1980's?  . Mastectomy Right 1980's  . Abdominal hysterectomy    . Breast biopsy Right 1980's    +  . Breast excisional biopsy Left 2015    + stereo no surgery  . Peripheral vascular catheterization  04/28/2015    Procedure: Lower Extremity Intervention;  Surgeon: Algernon Huxley, MD;  Location: [facility];  Service: Cardiovascular;;  . Peripheral vascular catheterization N/A 04/28/2015    Procedure: Abdominal Aortogram w/Lower Extremity;  Surgeon: Algernon Huxley, MD;  Location: [facility];  Service: Cardiovascular;  Laterality: N/A;   Family History  Problem Relation Age of Onset  . Diabetes Son   . Diabetes Son   . Heart disease Mother   . Kidney cancer Neg Hx   . Kidney disease Son   . Bladder Cancer Neg Hx   . Prostate cancer Neg Hx    Social History  Substance Use Topics  . Smoking status: Never Smoker   . Smokeless tobacco: Never Used  . Alcohol Use: No   OB History    No data available     Review of Systems  All other systems reviewed and are negative.     Allergies  Review of patient's allergies indicates no known allergies.  Home Medications   Prior to Admission medications   Medication Sig Start Date End Date Taking? Authorizing Provider  Alcohol Swabs (B-D SINGLE USE SWABS  REGULAR) PADS Use as directed. 07/14/15  Yes Jayce G Cook, DO  ALPHAGAN P 0.1 % SOLN PLACE 1 DROP INTO BOTH EYES 2 (TWO) TIMES DAILY. 06/30/15  Yes Jayce G Cook, DO  amLODipine (NORVASC) 5 MG tablet TAKE 1 TABLET (5 MG TOTAL) BY MOUTH DAILY. 07/08/15  Yes Coral Spikes, DO  B-D UF III MINI PEN NEEDLES 31G X 5 MM MISC  01/30/15  Yes Historical Provider, MD  bimatoprost (LUMIGAN) 0.01 % SOLN Place 1 drop into both eyes at bedtime.   Yes Historical Provider, MD  Calcium Carb-Cholecalciferol (CALCIUM 600 + D PO) Take 1 tablet by mouth daily.    Yes Historical Provider, MD  Cholecalciferol (VITAMIN D3) 1000 UNITS CAPS Take 1 capsule by mouth  daily.    Yes Historical Provider, MD  clopidogrel (PLAVIX) 75 MG tablet Take 1 tablet (75 mg total) by mouth daily. 07/08/15  Yes Jayce G Cook, DO  CRANBERRY PO Take 500 mg by mouth 2 (two) times daily.    Yes Historical Provider, MD  exemestane (AROMASIN) 25 MG tablet Take 1 tablet (25 mg total) by mouth at bedtime. 07/08/15  Yes Coral Spikes, DO  Fluocinolone Acetonide 0.01 % SHAM Apply to scalp once daily; work into [employer] and allow to remain on scalp for ~5 minutes then rinse with water. 07/08/15  Yes Coral Spikes, DO  hydrALAZINE (APRESOLINE) 50 MG tablet Take 1 tablet (50 mg total) by mouth 4 (four) times daily. 07/08/15  Yes Coral Spikes, DO  Insulin Lispro Prot & Lispro (HUMALOG MIX 75/25 KWIKPEN) (75-25) 100 UNIT/ML Kwikpen Inject 25 Units into the skin 2 (two) times daily.    Yes Historical Provider, MD  Multiple Vitamins-Minerals (MULTIVITAMIN GUMMIES ADULT PO) Take 1 tablet by mouth daily.    Yes Historical Provider, MD  omeprazole (PRILOSEC) 20 MG capsule Take 1 capsule (20 mg total) by mouth daily. 07/08/15  Yes Coral Spikes, DO  ranitidine (ZANTAC) 150 MG capsule Take 1 capsule (150 mg total) by mouth 2 (two) times daily. 07/08/15  Yes Coral Spikes, DO  TRUEPLUS SAFETY LANCETS 28G [place]  01/31/15  Yes Historical Provider, MD  TRUETEST TEST test strip Use as directed 07/14/15  Yes Jayce G Cook, DO  insulin aspart protamine - aspart (NOVOLOG MIX 70/30 FLEXPEN) (70-30) 100 UNIT/ML FlexPen Inject 0.35 mLs (35 Units total) into the skin 2 (two) times daily. Patient not taking: Reported on 07/28/2015 01/01/15   Jayce G Cook, DO   BP 144/44 mmHg  Pulse 97  Temp(Src) 98.3 F (36.8 C) (Oral)  Resp 16  Wt 152 lb (68.947 kg)  SpO2 100% Physical Exam  Constitutional: She is oriented to person, place, and time. She appears well-developed and well-nourished. No distress.  HENT:  Head: Normocephalic and atraumatic.  Eyes: EOM are normal. Pupils are equal, round, and reactive to light.  Neck: Normal  range of motion.  Cardiovascular: Normal rate, regular rhythm and normal heart sounds.   Pulmonary/Chest: Effort normal and breath sounds normal.  Abdominal: Soft. She exhibits no distension. There is no tenderness.  Musculoskeletal: Normal range of motion.  Neurological: She is alert and oriented to person, place, and time.  5/5 strength in major muscle groups of  bilateral upper and lower extremities. Speech normal. No facial asymetry.   Skin: Skin is warm and dry.  Psychiatric: She has a normal mood and affect. Judgment normal.  Nursing note and vitals reviewed.   ED Course  Procedures (including critical  care time) Labs Review Labs Reviewed  CBC WITH DIFFERENTIAL/PLATELET - Abnormal; Notable for the following:    WBC 11.8 (*)    RDW 16.2 (*)    Platelets 510 (*)    All other components within normal limits  COMPREHENSIVE METABOLIC PANEL - Abnormal; Notable for the following:    Glucose, Bld 216 (*)    Total Protein 5.8 (*)    Albumin 2.8 (*)    ALT 12 (*)    All other components within normal limits  URINALYSIS, ROUTINE W REFLEX MICROSCOPIC (NOT AT [facility]) - Abnormal; Notable for the following:    Glucose, UA 100 (*)    All other components within normal limits  TROPONIN I    Imaging Review Dg Chest 2 View  07/28/2015  CLINICAL DATA:  Shortness of breath, body aches EXAM: CHEST  2 VIEW COMPARISON:  05/16/2015 FINDINGS: Lungs are essentially clear. Chronic 5 mm nodule in the right middle lung likely reflects a calcified granuloma. No pleural effusion or pneumothorax. Cardiomegaly.  Postsurgical changes related to prior CABG. Degenerative changes of the bilateral shoulders. IMPRESSION: No evidence of acute cardiopulmonary disease. Electronically Signed   By: Julian Hy M.D.   On: 07/28/2015 16:13   Mr Brain Wo Contrast  07/28/2015  CLINICAL DATA:  History of prior stroke with right-sided deficits. Worsening right-sided weakness yesterday and today with right-sided  heaviness in tingling intermittently for the past couple of weeks. EXAM: MRI HEAD WITHOUT CONTRAST TECHNIQUE: Multiplanar, multiecho pulse sequences of the brain and surrounding structures were obtained without intravenous contrast. COMPARISON:  02/18/2015 FINDINGS: Advanced disc degeneration and associated spinal stenosis are again partially visualized in the cervical spine, incompletely evaluated. There is no evidence of acute infarct, intracranial hemorrhage, mass, midline shift, or extra-axial fluid collection. Patchy T2 hyperintensities in the deep cerebral white matter bilaterally and in the pons are unchanged and compatible with moderate chronic small vessel ischemic disease. Chronic lacunar infarcts are again seen in the deep gray nuclei bilaterally, with greatest involvement of the left thalamus. Subcentimeter cyst or chronic infarct in the upper right paramedian pons is unchanged. There is mild-to-moderate cerebral atrophy. Prior left cataract extraction is noted. The paranasal sinuses are clear. There are moderate bilateral mastoid effusions. Major intracranial vascular flow voids are preserved. IMPRESSION: 1. No acute intracranial abnormality. 2. Moderate chronic small vessel ischemic disease with chronic lacunar infarcts as above. Electronically Signed   By: Logan Bores M.D.   On: 07/28/2015 15:28   I have personally reviewed and evaluated these images and lab results as part of my medical decision-making.   EKG Interpretation   Date/Time:  Monday July 28 2015 11:42:53 EST Ventricular Rate:  90 PR Interval:  151 QRS Duration: 113 QT Interval:  357 QTC Calculation: 437 R Axis:   4 Text Interpretation:  Sinus rhythm Paired ventricular premature complexes  Borderline intraventricular conduction delay Borderline repolarization  abnormality No significant change was found Confirmed by Kellyann Ordway  MD, Emin Foree  ([id number]) on 07/28/2015 4:43:28 PM      MDM   Final diagnoses:  SOB (shortness  of breath)    5:23 PM Vital signs are stable.  Significant workup in the emergency department without acute abnormalities.  MRI demonstrates a stroke.  Labs without abnormalities.  Urine without abnormality.  I recommended that the family speak with her home health nurse about possible additional resources or short-term rehabilitation.  She seems as though she is slowly becoming weaker likely secondary to her advanced age.  I  do not think the patient is additional workup in emergency department tonight.  I do not think she needs to be admitted to the hospital.  Close primary care follow-up.  Patient family understand to return to the ER for new or worsening symptoms    Jola Schmidt, MD 07/28/15 1724

## 2015-07-28 NOTE — ED Notes (Signed)
Pt has hx prev stroke with right sided deficits.  Woke yesterday  Morning with increased symptoms in right improving throughout the day.  Woke again today with repeat increased weakness.

## 2015-08-06 ENCOUNTER — Telehealth: Payer: Self-pay

## 2015-08-06 NOTE — Telephone Encounter (Signed)
Okay to give verbal order.

## 2015-08-06 NOTE — Telephone Encounter (Signed)
[facility] is requesting a verbal order for 2 more weeks of physical therapy, pt is currently doing well. Please advise, okay to give verbal order. Thanks

## 2015-08-06 NOTE — Telephone Encounter (Signed)
Verbal Order given  

## 2015-08-11 ENCOUNTER — Ambulatory Visit (INDEPENDENT_AMBULATORY_CARE_PROVIDER_SITE_OTHER): Payer: Medicare PPO | Admitting: Nurse Practitioner

## 2015-08-11 ENCOUNTER — Other Ambulatory Visit: Payer: Self-pay | Admitting: Nurse Practitioner

## 2015-08-11 ENCOUNTER — Ambulatory Visit (INDEPENDENT_AMBULATORY_CARE_PROVIDER_SITE_OTHER): Payer: Medicare PPO

## 2015-08-11 ENCOUNTER — Encounter: Payer: Self-pay | Admitting: Nurse Practitioner

## 2015-08-11 VITALS — BP 136/62 | HR 108 | Temp 97.9°F | Resp 18 | Ht 63.0 in | Wt 152.0 lb

## 2015-08-11 VITALS — BP 136/62 | HR 108 | Temp 98.1°F | Resp 14 | Ht 63.0 in | Wt 152.0 lb

## 2015-08-11 DIAGNOSIS — R300 Dysuria: Secondary | ICD-10-CM

## 2015-08-11 DIAGNOSIS — Z0000 Encounter for general adult medical examination without abnormal findings: Secondary | ICD-10-CM | POA: Diagnosis not present

## 2015-08-11 LAB — POCT URINALYSIS DIP (MANUAL ENTRY)
Bilirubin, UA: NEGATIVE
GLUCOSE UA: NEGATIVE
Nitrite, UA: POSITIVE — AB
Protein Ur, POC: 100 — AB
Urobilinogen, UA: 0.2
pH, UA: 5

## 2015-08-11 MED ORDER — AMOXICILLIN 500 MG PO CAPS: 500.0000 mg | ORAL_CAPSULE | Freq: Two times a day (BID) | ORAL | Status: DC

## 2015-08-11 NOTE — Progress Notes (Signed)
Subjective:   [name] is a 81 y.o. female who presents for an Initial Medicare Annual Wellness Visit.  Review of Systems    No ROS.  Medicare Wellness Visit.  Cardiac Risk Factors include: advanced age (>40men, >26 women);diabetes mellitus;hypertension     Objective:    Today's Vitals   08/11/15 1400  BP: 136/62  Pulse: 108  Temp: 98.1 F (36.7 C)  TempSrc: Oral  Resp: 14  Height: 5\' 3"  (1.6 m)  Weight: 152 lb (68.947 kg)  SpO2: 99%    Current Medications (verified) Outpatient Encounter Prescriptions as of 08/11/2015  Medication Sig  . Alcohol Swabs (B-D SINGLE USE SWABS REGULAR) PADS Use as directed.  . ALPHAGAN P 0.1 % SOLN PLACE 1 DROP INTO BOTH EYES 2 (TWO) TIMES DAILY.  Marland Kitchen amLODipine (NORVASC) 5 MG tablet TAKE 1 TABLET (5 MG TOTAL) BY MOUTH DAILY.  Marland Kitchen B-D UF III MINI PEN NEEDLES 31G X 5 MM MISC   . bimatoprost (LUMIGAN) 0.01 % SOLN Place 1 drop into both eyes at bedtime.  . Calcium Carb-Cholecalciferol (CALCIUM 600 + D PO) Take 1 tablet by mouth daily.   . Cholecalciferol (VITAMIN D3) 1000 UNITS CAPS Take 1 capsule by mouth daily.   . clopidogrel (PLAVIX) 75 MG tablet Take 1 tablet (75 mg total) by mouth daily.  Marland Kitchen CRANBERRY PO Take 500 mg by mouth 2 (two) times daily.   Marland Kitchen exemestane (AROMASIN) 25 MG tablet Take 1 tablet (25 mg total) by mouth at bedtime.  . Fluocinolone Acetonide 0.01 % SHAM Apply to scalp once daily; work into [employer] and allow to remain on scalp for ~5 minutes then rinse with water.  . hydrALAZINE (APRESOLINE) 50 MG tablet Take 1 tablet (50 mg total) by mouth 4 (four) times daily.  . insulin aspart protamine - aspart (NOVOLOG MIX 70/30 FLEXPEN) (70-30) 100 UNIT/ML FlexPen Inject 0.35 mLs (35 Units total) into the skin 2 (two) times daily.  . Insulin Lispro Prot & Lispro (HUMALOG MIX 75/25 KWIKPEN) (75-25) 100 UNIT/ML Kwikpen Inject 25 Units into the skin 2 (two) times daily.   . Multiple Vitamins-Minerals (MULTIVITAMIN GUMMIES ADULT PO) Take 1  tablet by mouth daily.   Marland Kitchen omeprazole (PRILOSEC) 20 MG capsule Take 1 capsule (20 mg total) by mouth daily.  . ranitidine (ZANTAC) 150 MG capsule Take 1 capsule (150 mg total) by mouth 2 (two) times daily.  . TRUEPLUS SAFETY LANCETS 28G MISC   . TRUETEST TEST test strip Use as directed   No facility-administered encounter medications on file as of 08/11/2015.    Allergies (verified) Review of patient's allergies indicates no known allergies.   History: Past Medical History  Diagnosis Date  . Hyperlipidemia   . Hypertension   . History of DVT (deep vein thrombosis) 1980's    BLE  . Type II diabetes mellitus (HCC)     Insulin dependent.   . Decubitus ulcer, buttock   . TIA (transient ischemic attack) 02/18/2015  . Arthritis     "all over"  . Breast cancer ([place])     Hx of R breast cancer s/p mastectomy; Currently has L breast cancer (Diagnosed 2015).   . Glaucoma, bilateral   . Essential hypertension 01/01/2015  . Decubitus ulcer of sacral region, stage 2 01/01/2015  . Osteoarthritis 01/01/2015  . Itchy scalp 02/10/2015  . CKD (chronic kidney disease), stage III 02/18/2015  . Recurrent UTI 03/06/2015  . Diabetes mellitus with insulin therapy ([place]) 01/01/2015  . HLD (hyperlipidemia) 01/01/2015  .  History of breast cancer 01/01/2015  . Breast cancer, left breast ([place]) 01/01/2015  . Glaucoma 01/01/2015  . Anxiety state 01/01/2015  . Right knee pain 02/10/2015   Past Surgical History  Procedure Laterality Date  . Cholecystectomy    . Medial partial knee replacement Right 1980's?  . Mastectomy Right 1980's  . Abdominal hysterectomy    . Breast biopsy Right 1980's    +  . Breast excisional biopsy Left 2015    + stereo no surgery  . Peripheral vascular catheterization  04/28/2015    Procedure: Lower Extremity Intervention;  Surgeon: Algernon Huxley, MD;  Location: [facility];  Service: Cardiovascular;;  . Peripheral vascular catheterization N/A 04/28/2015    Procedure: Abdominal  Aortogram w/Lower Extremity;  Surgeon: Algernon Huxley, MD;  Location: [facility];  Service: Cardiovascular;  Laterality: N/A;   Family History  Problem Relation Age of Onset  . Diabetes Son   . Diabetes Son   . Heart disease Mother   . Kidney cancer Neg Hx   . Kidney disease Son   . Bladder Cancer Neg Hx   . Prostate cancer Neg Hx    Social History   Occupational History  . Not on file.   Social History Main Topics  . Smoking status: Never Smoker   . Smokeless tobacco: Never Used  . Alcohol Use: No  . Drug Use: No  . Sexual Activity: No    Tobacco Counseling Counseling given: Not Answered   Activities of Daily Living In your present state of health, do you have any difficulty performing the following activities: 08/11/2015 02/18/2015  Hearing? N N  Vision? N N  Difficulty concentrating or making decisions? N N  Walking or climbing stairs? Y Y  Dressing or bathing? Y Y  Doing errands, shopping? [place]pie Donning  Preparing Food and eating ? N -  Using the Toilet? N -  In the past six months, have you accidently leaked urine? N -  Do you have problems with loss of bowel control? N -  Managing your Medications? Y -  Managing your Finances? Y -  Housekeeping or managing your Housekeeping? Y -    Immunizations and Health Maintenance Immunization History  Administered Date(s) Administered  . Pneumococcal Polysaccharide-23 02/20/2015   Health Maintenance Due  Topic Date Due  . TETANUS/TDAP  02[birth year]06[birth year]1943  . ZOSTAVAX  02/06/1984  . DEXA SCAN  02/05/1989    Patient Care Team: Coral Spikes, DO as PCP - General (Family Medicine)  Indicate any recent Medical Services you may have received from other than [facility] providers in the past year (date may be approximate).     Assessment:   This is a routine wellness examination for [name]. The goal of the wellness visit is to assist the patient how to close the gaps in care and create a preventative care plan for the patient.    Taking VIT D3 Calcium as appropriate/Osteoporosis risk reviewed.  DEXA Scan postponed for follow up with PCP, per patient request.  Medications reviewed; taking without issues or barriers.  Safety issues reviewed; smoke detectors in the home. No firearms in the home. Wears seatbelts when riding with others. No violence in the home.  No identified risk were noted; The patient was oriented x 3; appropriate in dress and manner and no objective failures at ADL's or IADL's.   TDAP and ZOSTAVAX vaccine postponed for follow up with insurance, per patient request.  DMII neuro nt [facility]-stable  and followed by PCP Periph vascular dis-stable and followed by [facility] Type 2 diabetes w o-stable and followed by PCP Long-term use of insulin-stable and followed by PCP Malign neopl breast NOS-stable and followed by Dr. Oliva Bustard Malignant neoplasm of unspecified site of left fem-stable and followed by Dr. Oliva Bustard  Patient Concerns:  C/O burning when urinating x4 days; requests POCT for urinary tract infection.  Reoccurrent symptoms.  Deferred to the doctor of the day for immediate follow up. Unexplained weight loss.  Deferred to PCP for follow up.  Hearing/Vision screen Hearing Screening Comments: Passes the whisper test Vision Screening Comments: Followed by [facility] Annual visits Last OV 07/21/2015  Dietary issues and exercise activities discussed: Current Exercise Habits:: Structured exercise class (Physical therapy exercise), Time (Minutes): 60, Frequency (Times/Week): 2, Weekly Exercise (Minutes/Week): 120, Intensity: Mild  Goals    . Healthy Lifestyle     Maintain physical therapy exercises Stay hydrated! Drink plenty of water Choose leafy green foods, and lean meats (chicken, [place], fish)      Depression Screen PHQ 2/9 Scores 08/11/2015 01/01/2015  PHQ - 2 Score 0 0    Fall Risk Fall Risk  08/11/2015 01/01/2015  Falls in the past year? Yes Yes   Number falls in past yr: 1 1  Injury with Fall? Yes Yes  Risk Factor Category  High Fall Risk -  Risk for fall due to : Impaired balance/gait -  Follow up Education provided;Falls prevention discussed -    Cognitive Function: MMSE - Mini Mental State Exam 08/11/2015  Orientation to time 5  Orientation to Place 5  Registration 3  Attention/ Calculation 5  Recall 0  Language- name 2 objects 2  Language- repeat 1  Language- follow 3 step command 3  Language- read & follow direction 0  Write a sentence 0  Copy design 0  Total score 24    Screening Tests Health Maintenance  Topic Date Due  . TETANUS/TDAP  02[birth year]06[birth year]1943  . ZOSTAVAX  02/06/1984  . DEXA SCAN  02/05/1989  . HEMOGLOBIN A1C  08/19/2015  . FOOT EXAM  01/01/2016  . INFLUENZA VACCINE  01/13/2016  . OPHTHALMOLOGY EXAM  01/22/2016  . PNA vac Low Risk Adult (2 of 2 - PCV13) 02/20/2016      Plan:   End of life planning; Advance aging; Advanced directives discussed. Copy of current HCPOA/Living Will requested.    During the course of the visit, [place] was educated and counseled about the following appropriate screening and preventive services:   Vaccines to include Pneumoccal, Influenza, Hepatitis B, Td, Zostavax, HCV  Electrocardiogram  Cardiovascular disease screening  Colorectal cancer screening  Bone density screening  Diabetes screening  Glaucoma screening  Mammography/PAP  Nutrition counseling  Smoking cessation counseling  Patient Instructions (the written plan) were given to the patient.    Varney Biles, LPN   [id number]

## 2015-08-11 NOTE — Patient Instructions (Addendum)
[name] , Thank you for taking time to come for your Medicare Wellness Visit. I appreciate your ongoing commitment to your health goals. Please review the following plan we discussed and let me know if I can assist you in the future.     This is a list of the screening recommended for you and due dates:  Health Maintenance  Topic Date Due  . Tetanus Vaccine  02/06/1943  . Shingles Vaccine  02/06/1984  . DEXA scan (bone density measurement)  02/05/1989  . Hemoglobin A1C  08/19/2015  . Complete foot exam   01/01/2016  . Flu Shot  01/13/2016  . Eye exam for diabetics  01/22/2016  . Pneumonia vaccines (2 of 2 - PCV13) 02/20/2016    Fall Prevention in the Home  Falls can cause injuries. They can happen to people of all ages. There are many things you can do to make your home safe and to help prevent falls.  WHAT CAN I DO ON THE OUTSIDE OF MY HOME?  Regularly fix the edges of walkways and driveways and fix any cracks.  Remove anything that might make you trip as you walk through a door, such as a raised step or threshold.  Trim any bushes or trees on the path to your home.  Use bright outdoor lighting.  Clear any walking paths of anything that might make someone trip, such as rocks or tools.  Regularly check to see if handrails are loose or broken. Make sure that both sides of any steps have handrails.  Any raised decks and porches should have guardrails on the edges.  Have any leaves, snow, or ice cleared regularly.  Use sand or salt on walking paths during winter.  Clean up any spills in your garage right away. This includes oil or grease spills. WHAT CAN I DO IN THE BATHROOM?   Use night lights.  Install grab bars by the toilet and in the tub and shower. Do not use towel bars as grab bars.  Use non-skid mats or decals in the tub or shower.  If you need to sit down in the shower, use a plastic, non-slip stool.  Keep the floor dry. Clean up any water that spills on  the floor as soon as it happens.  Remove soap buildup in the tub or shower regularly.  Attach bath mats securely with double-sided non-slip rug tape.  Do not have throw rugs and other things on the floor that can make you trip. WHAT CAN I DO IN THE BEDROOM?  Use night lights.  Make sure that you have a light by your bed that is easy to reach.  Do not use any sheets or blankets that are too big for your bed. They should not hang down onto the floor.  Have a firm chair that has side arms. You can use this for support while you get dressed.  Do not have throw rugs and other things on the floor that can make you trip. WHAT CAN I DO IN THE KITCHEN?  Clean up any spills right away.  Avoid walking on wet floors.  Keep items that you use a lot in easy-to-reach places.  If you need to reach something above you, use a strong step stool that has a grab bar.  Keep electrical cords out of the way.  Do not use floor polish or wax that makes floors slippery. If you must use wax, use non-skid floor wax.  Do not have throw rugs and other  things on the floor that can make you trip. WHAT CAN I DO WITH MY STAIRS?  Do not leave any items on the stairs.  Make sure that there are handrails on both sides of the stairs and use them. Fix handrails that are broken or loose. Make sure that handrails are as long as the stairways.  Check any carpeting to make sure that it is firmly attached to the stairs. Fix any carpet that is loose or worn.  Avoid having throw rugs at the top or bottom of the stairs. If you do have throw rugs, attach them to the floor with carpet tape.  Make sure that you have a light switch at the top of the stairs and the bottom of the stairs. If you do not have them, ask someone to add them for you. WHAT ELSE CAN I DO TO HELP PREVENT FALLS?  Wear shoes that:  Do not have high heels.  Have rubber bottoms.  Are comfortable and fit you well.  Are closed at the toe. Do not  wear sandals.  If you use a stepladder:  Make sure that it is fully opened. Do not climb a closed stepladder.  Make sure that both sides of the stepladder are locked into place.  Ask someone to hold it for you, if possible.  Clearly mark and make sure that you can see:  Any grab bars or handrails.  First and last steps.  Where the edge of each step is.  Use tools that help you move around (mobility aids) if they are needed. These include:  Canes.  Walkers.  Scooters.  Crutches.  Turn on the lights when you go into a dark area. Replace any light bulbs as soon as they burn out.  Set up your furniture so you have a clear path. Avoid moving your furniture around.  If any of your floors are uneven, fix them.  If there are any pets around you, be aware of where they are.  Review your medicines with your doctor. Some medicines can make you feel dizzy. This can increase your chance of falling. Ask your doctor what other things that you can do to help prevent falls.   This information is not intended to replace advice given to you by your health care provider. Make sure you discuss any questions you have with your health care provider.   Document Released: 03/27/2009 Document Revised: 10/15/2014 Document Reviewed: 07/05/2014 Elsevier Interactive Patient Education [employer].

## 2015-08-11 NOTE — Patient Instructions (Signed)
Amoxicillin twice daily x 7 days for UTI.   We will fill out and return the forms for buttocks pads to help heal the sore.   Follow up with your provider about weight loss.

## 2015-08-12 LAB — URINALYSIS, ROUTINE W REFLEX MICROSCOPIC
Bilirubin Urine: NEGATIVE
GLUCOSE, UA: NEGATIVE
HGB URINE DIPSTICK: NEGATIVE
KETONES UR: NEGATIVE
NITRITE: POSITIVE — AB
PH: 5 (ref 5.0–8.0)
SPECIFIC GRAVITY, URINE: 1.023 (ref 1.001–1.035)

## 2015-08-12 LAB — URINALYSIS, MICROSCOPIC ONLY
CRYSTALS: NONE SEEN [HPF]
Casts: NONE SEEN [LPF]
WBC, UA: >60 WBC/HPF — AB (ref <=5)
YEAST: NONE SEEN [HPF]

## 2015-08-14 LAB — URINE CULTURE: Colony Count: >=100000

## 2015-08-17 DIAGNOSIS — R300 Dysuria: Secondary | ICD-10-CM | POA: Insufficient documentation

## 2015-08-17 NOTE — Assessment & Plan Note (Signed)
Patient has been seen in October 2016 by urology for recurrent UTIs Patient was treated past with amoxicillin 500 mg twice a day 7 days We will repeat this due to urinalysis findings today Await culture Tachycardia seems to be due more to anxiety rather than possibly sepsis today since she tends to run slightly elevated. FU w/ PCP

## 2015-08-17 NOTE — Progress Notes (Signed)
Patient ID: [name], female    DOB: 29-Jan-1922  Age: 82 y.o. MRN: [id number]  CC: Dysuria   HPI [name] presents for CC of dysuria x 1-2 days.   1) Pt reports burning with urination the past few times she has used the restroom. She denies trouble with urinating or hematuria. Pt denies flank pain. She has not tried anything over the counter. She is in a wheelchair and has a small decubitus ulcer that they are already working on healing per the daughter   Pt reports wt loss, but has been steady at 152 lbs. She would like to discuss this with her PCP.   History [name] has a past medical history of Hyperlipidemia; Hypertension; History of DVT (deep vein thrombosis) (1980's); Type II diabetes mellitus ([place]); Decubitus ulcer, buttock; TIA (transient ischemic attack) (02/18/2015); Arthritis; Breast cancer ([place]); Glaucoma, bilateral; Essential hypertension (01/01/2015); Decubitus ulcer of sacral region, stage 2 (01/01/2015); Osteoarthritis (01/01/2015); Itchy scalp (02/10/2015); CKD (chronic kidney disease), stage III (02/18/2015); Recurrent UTI (03/06/2015); Diabetes mellitus with insulin therapy ([place]) (01/01/2015); HLD (hyperlipidemia) (01/01/2015); History of breast cancer (01/01/2015); Breast cancer, left breast ([place]) (01/01/2015); Glaucoma (01/01/2015); Anxiety state (01/01/2015); and Right knee pain (02/10/2015).   She has past surgical history that includes Cholecystectomy; Medial partial knee replacement (Right, 1980's?); Mastectomy (Right, 1980's); Abdominal hysterectomy; Breast biopsy (Right, 1980's); Breast excisional biopsy (Left, 2015); Cardiac catheterization (04/28/2015); and Cardiac catheterization (N/A, 04/28/2015).   Her family history includes Diabetes in her son and son; Heart disease in her mother; Kidney disease in her son. There is no history of Kidney cancer, Bladder Cancer, or Prostate cancer.She reports that she has never smoked. She has never used smokeless tobacco. She reports that she does  not drink alcohol or use illicit drugs.  Outpatient Prescriptions Prior to Visit  Medication Sig Dispense Refill  . Alcohol Swabs (B-D SINGLE USE SWABS REGULAR) PADS Use as directed. 100 each 11  . ALPHAGAN P 0.1 % SOLN PLACE 1 DROP INTO BOTH EYES 2 (TWO) TIMES DAILY. 5 mL 1  . amLODipine (NORVASC) 5 MG tablet TAKE 1 TABLET (5 MG TOTAL) BY MOUTH DAILY. 90 tablet 2  . B-D UF III MINI PEN NEEDLES 31G X 5 MM MISC     . bimatoprost (LUMIGAN) 0.01 % SOLN Place 1 drop into both eyes at bedtime.    . Calcium Carb-Cholecalciferol (CALCIUM 600 + D PO) Take 1 tablet by mouth daily.     . Cholecalciferol (VITAMIN D3) 1000 UNITS CAPS Take 1 capsule by mouth daily.     . clopidogrel (PLAVIX) 75 MG tablet Take 1 tablet (75 mg total) by mouth daily. 90 tablet 2  . CRANBERRY PO Take 500 mg by mouth 2 (two) times daily.     Marland Kitchen exemestane (AROMASIN) 25 MG tablet Take 1 tablet (25 mg total) by mouth at bedtime. 30 tablet 6  . Fluocinolone Acetonide 0.01 % SHAM Apply to scalp once daily; work into [employer] and allow to remain on scalp for ~5 minutes then rinse with water. 120 mL 1  . hydrALAZINE (APRESOLINE) 50 MG tablet Take 1 tablet (50 mg total) by mouth 4 (four) times daily. 90 tablet 2  . insulin aspart protamine - aspart (NOVOLOG MIX 70/30 FLEXPEN) (70-30) 100 UNIT/ML FlexPen Inject 0.35 mLs (35 Units total) into the skin 2 (two) times daily. 15 mL 11  . Insulin Lispro Prot & Lispro (HUMALOG MIX 75/25 KWIKPEN) (75-25) 100 UNIT/ML Kwikpen Inject 25 Units into the skin 2 (two) times  daily.     . Multiple Vitamins-Minerals (MULTIVITAMIN GUMMIES ADULT PO) Take 1 tablet by mouth daily.     Marland Kitchen omeprazole (PRILOSEC) 20 MG capsule Take 1 capsule (20 mg total) by mouth daily. 90 capsule 2  . ranitidine (ZANTAC) 150 MG capsule Take 1 capsule (150 mg total) by mouth 2 (two) times daily. 90 capsule 2  . TRUEPLUS SAFETY LANCETS 28G MISC     . TRUETEST TEST test strip Use as directed 100 each 11   No facility-administered  medications prior to visit.    ROS Review of Systems  Constitutional: Positive for unexpected weight change. Negative for fever, chills, diaphoresis and fatigue.       Pt reports wt loss  Genitourinary: Positive for dysuria and frequency. Negative for urgency, hematuria, flank pain, decreased urine volume, difficulty urinating and pelvic pain.  Skin: Negative for rash.    Objective:  BP 136/62 mmHg  Pulse 108  Temp(Src) 97.9 F (36.6 C) (Oral)  Resp 18  Ht 5\' 3"  (1.6 m)  Wt 152 lb (68.947 kg)  BMI 26.93 kg/m2  SpO2 99%  Physical Exam  Constitutional: She appears well-developed and well-nourished. No distress.  HENT:  Head: Normocephalic and atraumatic.  Right Ear: External ear normal.  Left Ear: External ear normal.  Eyes: Right eye exhibits no discharge. Left eye exhibits no discharge. No scleral icterus.  Cardiovascular: Regular rhythm.   Tachycardic  Abdominal: There is no CVA tenderness.  Neurological: She is alert. Coordination abnormal.  Wheelchair  Skin: Skin is warm and dry. No rash noted. She is not diaphoretic.  Psychiatric: She has a normal mood and affect. Her behavior is normal. Judgment and thought content normal.   Assessment & Plan:   [name] was seen today for dysuria.  Diagnoses and all orders for this visit:  Dysuria -     POCT urinalysis dipstick -     Urinalysis, Routine w reflex microscopic -     Urine culture  Other orders -     Urine Microscopic   I am having [name] maintain her Vitamin D3, CRANBERRY PO, bimatoprost, Multiple Vitamins-Minerals (MULTIVITAMIN GUMMIES ADULT PO), Calcium Carb-Cholecalciferol (CALCIUM 600 + D PO), insulin aspart protamine - aspart, B-D UF III MINI PEN NEEDLES, TRUEPLUS SAFETY LANCETS 28G, Insulin Lispro Prot & Lispro, ALPHAGAN P, ranitidine, amLODipine, clopidogrel, exemestane, Fluocinolone Acetonide, hydrALAZINE, omeprazole, TRUETEST TEST, and B-D SINGLE USE SWABS REGULAR.  No orders of the defined types were  placed in this encounter.     Follow-up: No Follow-up on file.

## 2015-08-18 ENCOUNTER — Telehealth: Payer: Self-pay | Admitting: *Deleted

## 2015-08-18 NOTE — Telephone Encounter (Signed)
Verbal order to be given for duoderm. Patient was last evaluated by Dr. Lacinda Axon in January. Should be reevaluated next week at follow-up.

## 2015-08-18 NOTE — Telephone Encounter (Signed)
Morey Hummingbird this is the information about bed sore

## 2015-08-18 NOTE — Telephone Encounter (Signed)
Updated visit information for next weeks visit.

## 2015-08-18 NOTE — Telephone Encounter (Signed)
Please advise? Can i give a verbal order to have this done?

## 2015-08-18 NOTE — Telephone Encounter (Signed)
Mendel Ryder from [facility], stated that patient has a stage 1--2 pressure ulcer on her buttocks. She would like a order to use a diaderm on pressure ulcer.  Please advise  Contact Mendel Ryder  [phone]

## 2015-08-20 ENCOUNTER — Telehealth: Payer: Self-pay | Admitting: Family Medicine

## 2015-08-20 NOTE — Telephone Encounter (Signed)
Lesey from [facility] called , phone is [phone]. Pt has finished  antibiotics for a UTI. Still having burning when peeing.

## 2015-08-21 ENCOUNTER — Other Ambulatory Visit: Payer: Self-pay | Admitting: Nurse Practitioner

## 2015-08-21 MED ORDER — CEPHALEXIN 500 MG PO CAPS: 500.0000 mg | ORAL_CAPSULE | Freq: Two times a day (BID) | ORAL | Status: DC

## 2015-08-21 NOTE — Telephone Encounter (Signed)
Pt still having UTI symptoms. Pt c/o burning when urination. Please advise, thanks

## 2015-08-21 NOTE — Telephone Encounter (Signed)
Please let pt know that I have called in Keflex to [facility] [address] For her to take twice daily x 7 days.

## 2015-08-21 NOTE — Telephone Encounter (Signed)
Notified [name]

## 2015-08-21 NOTE — Telephone Encounter (Signed)
I will forward to Lorane Gell as she is the provider that saw the patient for this issue.

## 2015-08-26 ENCOUNTER — Encounter: Payer: Self-pay | Admitting: Family Medicine

## 2015-08-26 ENCOUNTER — Ambulatory Visit (INDEPENDENT_AMBULATORY_CARE_PROVIDER_SITE_OTHER): Payer: Medicare PPO | Admitting: Family Medicine

## 2015-08-26 DIAGNOSIS — I639 Cerebral infarction, unspecified: Secondary | ICD-10-CM | POA: Insufficient documentation

## 2015-08-26 DIAGNOSIS — R634 Abnormal weight loss: Secondary | ICD-10-CM | POA: Insufficient documentation

## 2015-08-26 MED ORDER — BLOOD GLUCOSE METER KIT: 1.0000 | PACK | Freq: Once | Status: DC

## 2015-08-26 MED ORDER — BLOOD GLUCOSE METER KIT: PACK | Status: DC

## 2015-08-26 NOTE — Progress Notes (Signed)
Subjective:  Patient ID: [name], female    DOB: 1921-09-18  Age: 82 y.o. MRN: [id number]  CC: Weight loss  HPI:  82 year female with a complicated PMH including TIA, stroke, DM-2, HTN, CKD, Breast cancer (current, left breast), PVD presents with the above complaint.  Weight loss  Patient accompanied by daughter who is the primary historian.  Daughter reports that she has noticed significant weight loss over the past month.   She is unsure of the amount lost as it is very difficult to weigh her (she is wheelchair dependent).  No change in appetite. She states that Ms. Pennel eats well.  Blood sugars are well controlled without insulin.  No changes in bowels. She has had a recent UTI.  Additionally, she has 2 small sacral decubitus ulcers (stage 2 and stable).  No recent infection (other than recent cystitis).  Patient does have known breast cancer (she is only being treated with Aromasin).  No other changes. No changes in medication except for the recent 7 day course of antibiotics.  Social Hx   Social History   Social History  . Marital Status: Widowed    Spouse Name: N/A  . Number of Children: N/A  . Years of Education: N/A   Social History Main Topics  . Smoking status: Never Smoker   . Smokeless tobacco: Never Used  . Alcohol Use: No  . Drug Use: No  . Sexual Activity: No   Other Topics Concern  . None   Social History Narrative   Lives with daughter/daughter's husband.   Denita Lung [phone]).    Review of Systems  Constitutional: Positive for fatigue and unexpected weight change.  Neurological: Positive for weakness.   Objective:  BP 118/74 mmHg  Pulse 109  Temp(Src) 98.1 F (36.7 C) (Oral)  Ht 5' 3"  (1.6 m)  Wt 166 lb 4 oz (75.411 kg)  BMI 29.46 kg/m2  SpO2 98%  BP/Weight 08/26/2015 08/11/2015 0/32/1224  Systolic BP 825 003 704  Diastolic BP 74 62 62  Wt. (Lbs) 166.25 152 152  BMI 29.46 26.93 26.93   Physical Exam    Constitutional:  Chronically ill appearing female in NAD. Resting comfortably in wheelchair. Appears thin (compared to recent visit in Jan).  Cardiovascular: Regular rhythm.  Tachycardia present.   Pulmonary/Chest: Effort normal and breath sounds normal.  Abdominal: Soft. She exhibits no distension.  Musculoskeletal: She exhibits no edema.  Neurological: She is alert.  Vitals reviewed.   Lab Results  Component Value Date   WBC 11.8* 07/28/2015   HGB 12.2 07/28/2015   HCT 37.7 07/28/2015   PLT 510* 07/28/2015   GLUCOSE 216* 07/28/2015   CHOL 84 02/19/2015   TRIG 88 02/19/2015   HDL 27* 02/19/2015   LDLCALC 39 02/19/2015   ALT 12* 07/28/2015   AST 23 07/28/2015   NA 138 07/28/2015   K 4.0 07/28/2015   CL 106 07/28/2015   CREATININE 0.70 07/28/2015   BUN 14 07/28/2015   CO2 22 07/28/2015   TSH 6.97* 03/01/2012   HGBA1C 7.4* 02/19/2015    Assessment & Plan:   Problem List Items Addressed This Visit    Weight loss    New problem.  Unclear etiology but I suspect that unintentional weight loss is from malignancy. Advanced age and chronic illness likely also playing a role. Discussed potential work up regarding malignancy (imaging).  However, patient and family have already decided previously not to proceed with chemo/surgery for breast cancer. Will discuss with  oncology. Recent labs reviewed (unrevealing). Patient to return for labs. Needs close follow up. Referring to nutrition.      Relevant Orders   CBC   Comp Met (CMET)   HgB A1c   TSH   Amb ref to Medical Nutrition Therapy-MNT      Meds ordered this encounter  Medications  . DISCONTD: blood glucose meter kit and supplies 1 kit    Sig:   . blood glucose meter kit and supplies    Sig: Dispense based on patient and insurance preference. Use up to four times daily as directed. (FOR ICD-9 250.00, 250.01).    Dispense:  1 each    Refill:  0    True plus meter    Order Specific Question:  Number of strips     Answer:  100    Order Specific Question:  Number of lancets    Answer:  100    Follow-up: 2 weeks.  Dana

## 2015-08-26 NOTE — Patient Instructions (Signed)
I will be in touch regarding your weight loss and the scan once I talk to the oncologist.  We will in contact regarding you seeing a nutritionist.   Take care  Dr. Lacinda Axon   Follow up in ~ 2 weeks.

## 2015-08-26 NOTE — Assessment & Plan Note (Addendum)
New problem.  Unclear etiology but I suspect that unintentional weight loss is from malignancy. Advanced age and chronic illness likely also playing a role. Discussed potential work up regarding malignancy (imaging).  However, patient and family have already decided previously not to proceed with chemo/surgery for breast cancer. Will discuss with oncology. Recent labs reviewed (unrevealing). Patient to return for labs. Needs close follow up. Referring to nutrition.

## 2015-08-26 NOTE — Progress Notes (Signed)
Pre visit review using our clinic review tool, if applicable. No additional management support is needed unless otherwise documented below in the visit note. 

## 2015-09-01 ENCOUNTER — Other Ambulatory Visit (INDEPENDENT_AMBULATORY_CARE_PROVIDER_SITE_OTHER): Payer: Medicare PPO

## 2015-09-01 DIAGNOSIS — R634 Abnormal weight loss: Secondary | ICD-10-CM | POA: Diagnosis not present

## 2015-09-01 LAB — COMPREHENSIVE METABOLIC PANEL
ALBUMIN: 3.3 g/dL — AB (ref 3.5–5.2)
ALK PHOS: 82 U/L (ref 39–117)
ALT: 17 U/L (ref 0–35)
AST: 24 U/L (ref 0–37)
BUN: 20 mg/dL (ref 6–23)
CALCIUM: 9.9 mg/dL (ref 8.4–10.5)
CHLORIDE: 101 meq/L (ref 96–112)
CO2: 27 mEq/L (ref 19–32)
Creatinine, Ser: 0.83 mg/dL (ref 0.40–1.20)
GFR: 82.77 mL/min (ref >60.00)
Glucose, Bld: 214 mg/dL — ABNORMAL HIGH (ref 70–99)
POTASSIUM: 3.7 meq/L (ref 3.5–5.1)
Sodium: 135 mEq/L (ref 135–145)
TOTAL PROTEIN: 6.4 g/dL (ref 6.0–8.3)
Total Bilirubin: 0.3 mg/dL (ref 0.2–1.2)

## 2015-09-01 LAB — CBC
HEMATOCRIT: 39.5 % (ref 36.0–46.0)
HEMOGLOBIN: 12.8 g/dL (ref 12.0–15.0)
MCHC: 32.4 g/dL (ref 30.0–36.0)
MCV: 89.4 fl (ref 78.0–100.0)
PLATELETS: 710 10*3/uL — AB (ref 150.0–400.0)
RBC: 4.42 Mil/uL (ref 3.87–5.11)
RDW: 17.5 % — ABNORMAL HIGH (ref 11.5–15.5)
WBC: 10.2 10*3/uL (ref 4.0–10.5)

## 2015-09-01 LAB — TSH: TSH: 2.04 u[IU]/mL (ref 0.35–4.50)

## 2015-09-01 LAB — HEMOGLOBIN A1C: HEMOGLOBIN A1C: 7.1 % — AB (ref 4.6–6.5)

## 2015-09-08 ENCOUNTER — Other Ambulatory Visit: Payer: Self-pay | Admitting: Family Medicine

## 2015-09-08 ENCOUNTER — Other Ambulatory Visit: Payer: Self-pay | Admitting: Nurse Practitioner

## 2015-09-09 NOTE — Telephone Encounter (Signed)
Should not need refill

## 2015-09-09 NOTE — Telephone Encounter (Signed)
Please advise refill on Keflex? Thanks

## 2015-09-15 ENCOUNTER — Other Ambulatory Visit: Payer: Self-pay

## 2015-09-15 MED ORDER — BLOOD GLUCOSE METER KIT: PACK | Status: DC

## 2015-09-15 MED ORDER — TRUEPLUS SAFETY LANCETS 28G MISC: Status: DC

## 2015-09-15 MED ORDER — TRUETEST TEST VI STRP: ORAL_STRIP | Status: DC

## 2015-09-29 ENCOUNTER — Encounter: Payer: Self-pay | Admitting: Dietician

## 2015-09-29 ENCOUNTER — Encounter: Payer: Medicare PPO | Attending: Family Medicine | Admitting: Dietician

## 2015-09-29 VITALS — Ht 61.0 in | Wt 132.9 lb

## 2015-09-29 DIAGNOSIS — R634 Abnormal weight loss: Secondary | ICD-10-CM

## 2015-09-29 DIAGNOSIS — E785 Hyperlipidemia, unspecified: Secondary | ICD-10-CM | POA: Insufficient documentation

## 2015-09-29 DIAGNOSIS — Z853 Personal history of malignant neoplasm of breast: Secondary | ICD-10-CM | POA: Insufficient documentation

## 2015-09-29 DIAGNOSIS — I10 Essential (primary) hypertension: Secondary | ICD-10-CM | POA: Insufficient documentation

## 2015-09-29 DIAGNOSIS — E119 Type 2 diabetes mellitus without complications: Secondary | ICD-10-CM | POA: Insufficient documentation

## 2015-09-29 DIAGNOSIS — C50912 Malignant neoplasm of unspecified site of left female breast: Secondary | ICD-10-CM

## 2015-09-29 DIAGNOSIS — Z794 Long term (current) use of insulin: Secondary | ICD-10-CM

## 2015-09-29 NOTE — Progress Notes (Signed)
Medical Nutrition Therapy: Visit start time: [id number]  end time: 1430   Assessment:  Diagnosis: unintentional weight loss Past medical history: breast cancer, diabetes, HTN, hyperlipidemia Psychosocial issues/ stress concerns: patient reports low stress level  Preferred learning method:  . Auditory  Current weight: 132.9lbs; measured weight in wheelchair, then empty wheelchair.  Height: 5'1" Medications, supplements: reviewed list in chart with patient and daughter, who is her caregiver.  Progress and evaluation: Patient and daughter report weight loss over the past several months.         Patient states she is enjoying foods, denies taste changes. Daughter and son-in-law state patient is eating only small portions.         They are controlling sugar intake to help manage blood sugars.    Physical activity: none; wheelchair bound  Dietary Intake:  Usual eating pattern includes 3 meals and 2-3 snacks per day. Dining out frequency: 1 meals per week -- often at cafeteria, usually a balanced meal with vegetables i.e. Spinach, corn.  Breakfast: oatmeal or grits, egg, bacon, orange juice, water. Sometimes adds fruit. Or cereal when having a busy morning.  Snack: oatmeal cookie or fruit Lunch: mostly vegetables, chicken or fish, bread; sometimes sandwich and soup with veggie sticks. Snack: same as am Supper: veg, maybe with sweet potato, chicken or fish, very little beef or pork. Snack: sometimes cookie (oatmeal) or veggie sticks Beverages: mostly water, occasional iced tea, orange juice unless BG high.   Nutrition Care Education: Topics covered: weight gain Basic nutrition: basic food groups, appropriate nutrient balance, appropriate meal and snack schedule   Weight gain: importance of eating small meals often, ways to boost caloric value in foods, importance of adequate protein, nutritional beverage options. Diabetes: appropriate carb intake and balance   Nutritional Diagnosis:  [place]-3.2  Unintentional weight loss As related to cancer, inadequate calorie intake.  As evidenced by patient and family reports.  Intervention: Instruction as noted above.   Set goals to eat often and add protein to snacks.    Patient and family did not wish to schedule follow-up at this time; encouraged them to call as needed.   Education Materials given:  . Weight Gain the [facility] Way [phone]) . Goals/ instructions  Learner/ who was taught:  . Patient  . Family member daughter Denita Lung  Level of understanding: Marland Kitchen Verbalizes/ demonstrates competency  Demonstrated degree of understanding via:   Teach back Learning barriers: . None  Willingness to learn/ readiness for change: . Acceptance, ready for change  Monitoring and Evaluation:  Dietary intake and body weight      follow up: prn

## 2015-09-29 NOTE — Patient Instructions (Signed)
   Eat 6 small meals and snacks each day; plan to eat something every 2-4 hours during the day.   Add some cottage cheese or block or stick cheese with fruit for snacks.   Try a protein/ nutrition drink as a snack -- such as Premier protein or Glucerna. Both are low in sugar.

## 2015-10-15 ENCOUNTER — Other Ambulatory Visit: Payer: Self-pay | Admitting: Vascular Surgery

## 2015-10-17 ENCOUNTER — Other Ambulatory Visit
Admission: RE | Admit: 2015-10-17 | Discharge: 2015-10-17 | Disposition: A | Discharge status: Home / Self Care | Payer: Medicare PPO | Source: Ambulatory Visit | Attending: Vascular Surgery | Admitting: Vascular Surgery

## 2015-10-17 DIAGNOSIS — I70202 Unspecified atherosclerosis of native arteries of extremities, left leg: Secondary | ICD-10-CM | POA: Insufficient documentation

## 2015-10-17 DIAGNOSIS — E785 Hyperlipidemia, unspecified: Secondary | ICD-10-CM | POA: Insufficient documentation

## 2015-10-17 DIAGNOSIS — E119 Type 2 diabetes mellitus without complications: Secondary | ICD-10-CM | POA: Diagnosis not present

## 2015-10-17 LAB — CREATININE, SERUM
Creatinine, Ser: 0.82 mg/dL (ref 0.44–1.00)
GFR calc non Af Amer: >60 mL/min (ref >60)

## 2015-10-17 LAB — BUN: BUN: 17 mg/dL (ref 6–20)

## 2015-10-20 ENCOUNTER — Ambulatory Visit
Admission: RE | Admit: 2015-10-20 | Discharge: 2015-10-20 | Disposition: A | Discharge status: Home / Self Care | Payer: Medicare PPO | Source: Ambulatory Visit | Attending: Vascular Surgery | Admitting: Vascular Surgery

## 2015-10-20 ENCOUNTER — Encounter
Admission: RE | Disposition: A | Discharge status: Home / Self Care | Payer: Self-pay | Source: Ambulatory Visit | Attending: Vascular Surgery

## 2015-10-20 DIAGNOSIS — Z8673 Personal history of transient ischemic attack (TIA), and cerebral infarction without residual deficits: Secondary | ICD-10-CM | POA: Diagnosis not present

## 2015-10-20 DIAGNOSIS — Z79899 Other long term (current) drug therapy: Secondary | ICD-10-CM | POA: Diagnosis not present

## 2015-10-20 DIAGNOSIS — E119 Type 2 diabetes mellitus without complications: Secondary | ICD-10-CM | POA: Insufficient documentation

## 2015-10-20 DIAGNOSIS — I10 Essential (primary) hypertension: Secondary | ICD-10-CM | POA: Diagnosis not present

## 2015-10-20 DIAGNOSIS — I70222 Atherosclerosis of native arteries of extremities with rest pain, left leg: Secondary | ICD-10-CM | POA: Diagnosis present

## 2015-10-20 HISTORY — PX: PERIPHERAL VASCULAR CATHETERIZATION: SHX172C

## 2015-10-20 SURGERY — LOWER EXTREMITY ANGIOGRAPHY
Anesthesia: Moderate Sedation | Laterality: Left

## 2015-10-20 MED ORDER — MIDAZOLAM HCL 2 MG/2ML IJ SOLN
INTRAMUSCULAR | Status: DC | PRN
  Administered 2015-10-20 (×4): 1 mg via INTRAVENOUS

## 2015-10-20 MED ORDER — HEPARIN (PORCINE) IN NACL 2-0.9 UNIT/ML-% IJ SOLN
INTRAMUSCULAR | Status: AC
Start: 2015-10-20 — End: 2015-10-20
  Filled 2015-10-20: qty 1000

## 2015-10-20 MED ORDER — HYDROMORPHONE HCL 1 MG/ML IJ SOLN: 1.0000 mg | Freq: Once | INTRAMUSCULAR | Status: DC

## 2015-10-20 MED ORDER — FAMOTIDINE 20 MG PO TABS: 40.0000 mg | ORAL_TABLET | ORAL | Status: DC | PRN

## 2015-10-20 MED ORDER — LIDOCAINE-EPINEPHRINE (PF) 1 %-1:200000 IJ SOLN
INTRAMUSCULAR | Status: AC
  Filled 2015-10-20: qty 30

## 2015-10-20 MED ORDER — DEXTROSE 5 % IV SOLN: 1.5000 g | INTRAVENOUS | Status: DC

## 2015-10-20 MED ORDER — FENTANYL CITRATE (PF) 100 MCG/2ML IJ SOLN
INTRAMUSCULAR | Status: AC
  Filled 2015-10-20: qty 2

## 2015-10-20 MED ORDER — FENTANYL CITRATE (PF) 100 MCG/2ML IJ SOLN
INTRAMUSCULAR | Status: DC | PRN
  Administered 2015-10-20: 50 ug via INTRAVENOUS
  Administered 2015-10-20: 25 ug via INTRAVENOUS

## 2015-10-20 MED ORDER — HEPARIN SODIUM (PORCINE) 1000 UNIT/ML IJ SOLN
INTRAMUSCULAR | Status: DC | PRN
  Administered 2015-10-20: 4000 [IU] via INTRAVENOUS

## 2015-10-20 MED ORDER — MIDAZOLAM HCL 2 MG/2ML IJ SOLN
INTRAMUSCULAR | Status: AC
  Filled 2015-10-20: qty 2

## 2015-10-20 MED ORDER — MIDAZOLAM HCL 5 MG/5ML IJ SOLN
INTRAMUSCULAR | Status: AC
  Filled 2015-10-20: qty 5

## 2015-10-20 MED ORDER — SODIUM CHLORIDE 0.9 % IV SOLN
INTRAVENOUS | Status: DC
  Administered 2015-10-20 (×2): via INTRAVENOUS

## 2015-10-20 MED ORDER — IOPAMIDOL (ISOVUE-300) INJECTION 61%
INTRAVENOUS | Status: DC | PRN
  Administered 2015-10-20: 55 mL via INTRAVENOUS

## 2015-10-20 MED ORDER — HEPARIN SODIUM (PORCINE) 1000 UNIT/ML IJ SOLN
INTRAMUSCULAR | Status: AC
  Filled 2015-10-20: qty 1

## 2015-10-20 MED ORDER — ONDANSETRON HCL 4 MG/2ML IJ SOLN: 4.0000 mg | Freq: Four times a day (QID) | INTRAMUSCULAR | Status: DC | PRN

## 2015-10-20 SURGICAL SUPPLY — 12 items
CATH CXI 4F 90 DAV (MICROCATHETER) ×3 IMPLANT
CATH KA2 5FR 65CM (CATHETERS) ×3 IMPLANT
CATH PIG 70CM (CATHETERS) ×3 IMPLANT
DEVICE STARCLOSE SE CLOSURE (Vascular Products) ×3 IMPLANT
DEVICE TORQUE (MISCELLANEOUS) ×3 IMPLANT
GLIDEWIRE ADV .035X260CM (WIRE) ×3 IMPLANT
PACK ANGIOGRAPHY (CUSTOM PROCEDURE TRAY) ×3 IMPLANT
SHEATH ANL 5FRX45 (SHEATH) ×3 IMPLANT
SHEATH BRITE TIP 5FRX11 (SHEATH) ×3 IMPLANT
SYR MEDRAD MARK V 150ML (SYRINGE) ×3 IMPLANT
TUBING CONTRAST HIGH PRESS 72 (TUBING) ×3 IMPLANT
WIRE J 3MM .035X145CM (WIRE) ×3 IMPLANT

## 2015-10-20 NOTE — H&P (Signed)
  [facility] History & Physical Update  The patient was interviewed and re-examined.  The patient's previous History and Physical has been reviewed and is unchanged.  There is no change in the plan of care. We plan to proceed with the scheduled procedure.  DEW,JASON, MD  10/20/2015, 1:55 PM

## 2015-10-20 NOTE — Op Note (Signed)
[facility] Percutaneous Study/Intervention Procedural Note   Date of Surgery: 10/20/2015  Surgeon(s):Alyissa Whidbee   Assistants:none  Pre-operative Diagnosis: PAD with rest pain left lower extremity  Post-operative diagnosis: Same  Procedure(s) Performed: 1. Ultrasound guidance for vascular access right femoral artery 2. Catheter placement into left posterior tibial artery or posterior tibial artery branches from right femoral approach 3. Aortogram and selective left lower extremity angiogram 4. StarClose closure device right femoral artery  EBL: 25 cc  Contrast: 55 cc  Fluoro Time: 16.4 minutes  Moderate Conscious Sedation Time: approximately 50 minutes using 4 mg of Versed and 75 mcg of Fentanyl  Indications: Patient is a 82 y.o.female with extensive peripheral vascular disease. The patient has noninvasive study showing occlusion of her previous bypass which we had opened up about 6 months ago percutaneously. It was noted that time with the distal runoff was poor, but we were able to get the bypass open. She has developed increasing pain in her left foot and a recent noninvasive study showed that this was again occluded. The patient is brought in for angiography for further evaluation and potential treatment. Risks and benefits are discussed and informed consent is obtained  Procedure: The patient was identified and appropriate procedural time out was performed. The patient was then placed supine on the table and prepped and draped in the usual sterile fashion.Moderate conscious sedation was administered during a face to face encounter with the patient throughout the procedure with my supervision of the RN administering medicines and monitoring the patient's vital signs, pulse oximetry, telemetry and mental status throughout from the start of the procedure until the patient was taken to the  recovery room. Ultrasound was used to evaluate the right common femoral artery. It was patent . A digital ultrasound image was acquired. A Seldinger needle was used to access the right common femoral artery under direct ultrasound guidance and a permanent image was performed. A 0.035 J wire was advanced without resistance and a 5Fr sheath was placed. Pigtail catheter was placed into the aorta and an AP aortogram was performed. This demonstrated normal renal arteries and normal aorta and iliac segments without significant stenosis. I then crossed the aortic bifurcation and advanced to the left femoral head. Selective left lower extremity angiogram was then performed. This demonstrated occlusion of the bypass which terminated in either the posterior tibial artery which was occluded or a posterior tibial artery branch with poor runoff distally. Native SFA was occluded with reconstitution of a diseased popliteal artery with the peroneal artery being the best runoff distally. It appeared the bypass may have been based off of the native SFA although this was not entirely clear. The patient was systemically heparinized and a 5 [place] Ansell sheath was then placed over the [employer] wire. I then used a Kumpe catheter and the advantage wire to probe the common femoral artery to look for a superficial femoral artery origin, but this was not apparent. I believe that her bypass was based off of the SFA which essentially excluded our abilities of opening her native circulation which had the best runoff distally. I passed a wire and a CXI catheter into the bypass and intraluminal flow showed extensive thrombosis. I advanced the wire down into the tibial vessels and put a CXI catheter and what appeared to be either the posterior tibial artery or a collateral the posterior tibial artery and intraluminal flow was confirmed. No significant runoff distally other than collaterals into her peroneal circulation were seen,  so  I did not feel that intervention and lysis of her bypass would have any long-term patency. We had demonstrated that by the bypass artery occluding within several months for previous intervention. Without ability to gain access proximally to the native circulation, no real options for percutaneous revascularization were found today. I elected to terminate the procedure. The sheath was removed and StarClose closure device was deployed in the right femoral artery with excellent hemostatic result. The patient was taken to the recovery room in stable condition having tolerated the procedure well.  Findings:  Aortogram: normal aorta and iliac arteries, no high grade renal artery stenosis identified Left Lower Extremity: Occlusion of the bypass which terminated in either the posterior tibial artery which was occluded or a posterior tibial artery branch with poor runoff distally. Native SFA was occluded with reconstitution of a diseased popliteal artery with the peroneal artery being the best runoff distally. It appeared the bypass may have been based off of the native SFA although this was not entirely clear.   Disposition: Patient was taken to the recovery room in stable condition having tolerated the procedure well.  Complications: None  Sady Monaco 10/20/2015 4:10 PM

## 2015-10-20 NOTE — Discharge Instructions (Signed)
Angiogram, Care After °Refer to this sheet in the next few weeks. These instructions provide you with information about caring for yourself after your procedure. Your health care provider may also give you more specific instructions. Your treatment has been planned according to current medical practices, but problems sometimes occur. Call your health care provider if you have any problems or questions after your procedure. °WHAT TO EXPECT AFTER THE PROCEDURE °After your procedure, it is typical to have the following: °· Bruising at the catheter insertion site that usually fades within 1-2 weeks. °· Blood collecting in the tissue (hematoma) that may be painful to the touch. It should usually decrease in size and tenderness within 1-2 weeks. °HOME CARE INSTRUCTIONS °· Take medicines only as directed by your health care provider. °· You may shower 24-48 hours after the procedure or as directed by your health care provider. Remove the bandage (dressing) and gently wash the site with plain soap and water. Pat the area dry with a clean towel. Do not rub the site, because this may cause bleeding. °· Do not take baths, swim, or use a hot tub until your health care provider approves. °· Check your insertion site every day for redness, swelling, or drainage. °· Do not apply powder or lotion to the site. °· Do not lift over 10 lb (4.5 kg) for 5 days after your procedure or as directed by your health care provider. °· Ask your health care provider when it is okay to: °¨ Return to work or school. °¨ Resume usual physical activities or sports. °¨ Resume sexual activity. °· Do not drive home if you are discharged the same day as the procedure. Have someone else drive you. °· You may drive 24 hours after the procedure unless otherwise instructed by your health care provider. °· Do not operate machinery or power tools for 24 hours after the procedure or as directed by your health care provider. °· If your procedure was done as an  outpatient procedure, which means that you went home the same day as your procedure, a responsible adult should be with you for the first 24 hours after you arrive home. °· Keep all follow-up visits as directed by your health care provider. This is important. °SEEK MEDICAL CARE IF: °· You have a fever. °· You have chills. °· You have increased bleeding from the catheter insertion site. Hold pressure on the site. °SEEK IMMEDIATE MEDICAL CARE IF: °· You have unusual pain at the catheter insertion site. °· You have redness, warmth, or swelling at the catheter insertion site. °· You have drainage (other than a small amount of blood on the dressing) from the catheter insertion site. °· The catheter insertion site is bleeding, and the bleeding does not stop after 30 minutes of holding steady pressure on the site. °· The area near or just beyond the catheter insertion site becomes pale, cool, tingly, or numb. °  °This information is not intended to replace advice given to you by your health care provider. Make sure you discuss any questions you have with your health care provider. °  °Document Released: 12/17/2004 Document Revised: 06/21/2014 Document Reviewed: 11/01/2012 °Elsevier Interactive Patient Education ©2016 Elsevier Inc. ° °

## 2015-10-21 ENCOUNTER — Encounter: Payer: Self-pay | Admitting: Vascular Surgery

## 2015-11-25 ENCOUNTER — Other Ambulatory Visit: Payer: Self-pay | Admitting: Family Medicine

## 2015-11-25 NOTE — Telephone Encounter (Signed)
Patient doing well via pt daughter. Daughter was told medication Aromasin needs to come from oncology.

## 2015-11-25 NOTE — Telephone Encounter (Signed)
Needs to come from [place]. How is she doing? Ashleigh, please call.

## 2015-11-25 NOTE — Telephone Encounter (Signed)
Refilled 06/2015. Patient has two refills left. Please advise?

## 2015-12-11 ENCOUNTER — Other Ambulatory Visit: Payer: Self-pay | Admitting: Family Medicine

## 2015-12-11 ENCOUNTER — Encounter: Payer: Self-pay | Admitting: Family Medicine

## 2015-12-11 MED ORDER — TRUETEST TEST VI STRP: ORAL_STRIP | Status: DC

## 2015-12-31 ENCOUNTER — Other Ambulatory Visit: Payer: Self-pay | Admitting: Family Medicine

## 2016-01-13 ENCOUNTER — Other Ambulatory Visit: Payer: Self-pay

## 2016-01-13 ENCOUNTER — Ambulatory Visit: Payer: Self-pay | Admitting: Oncology

## 2016-01-26 ENCOUNTER — Inpatient Hospital Stay (HOSPITAL_BASED_OUTPATIENT_CLINIC_OR_DEPARTMENT_OTHER): Payer: Medicare PPO

## 2016-01-26 ENCOUNTER — Inpatient Hospital Stay: Payer: Medicare PPO | Attending: Oncology | Admitting: Oncology

## 2016-01-26 VITALS — BP 146/48 | HR 80 | Temp 96.8°F | Resp 18

## 2016-01-26 DIAGNOSIS — C50512 Malignant neoplasm of lower-outer quadrant of left female breast: Secondary | ICD-10-CM | POA: Diagnosis present

## 2016-01-26 DIAGNOSIS — Z79811 Long term (current) use of aromatase inhibitors: Secondary | ICD-10-CM | POA: Diagnosis not present

## 2016-01-26 DIAGNOSIS — I129 Hypertensive chronic kidney disease with stage 1 through stage 4 chronic kidney disease, or unspecified chronic kidney disease: Secondary | ICD-10-CM | POA: Insufficient documentation

## 2016-01-26 DIAGNOSIS — Z79899 Other long term (current) drug therapy: Secondary | ICD-10-CM | POA: Insufficient documentation

## 2016-01-26 DIAGNOSIS — E119 Type 2 diabetes mellitus without complications: Secondary | ICD-10-CM | POA: Insufficient documentation

## 2016-01-26 DIAGNOSIS — M1990 Unspecified osteoarthritis, unspecified site: Secondary | ICD-10-CM | POA: Diagnosis not present

## 2016-01-26 DIAGNOSIS — Z86718 Personal history of other venous thrombosis and embolism: Secondary | ICD-10-CM | POA: Diagnosis not present

## 2016-01-26 DIAGNOSIS — N183 Chronic kidney disease, stage 3 (moderate): Secondary | ICD-10-CM | POA: Insufficient documentation

## 2016-01-26 DIAGNOSIS — Z170 Estrogen receptor positive status [ER+]: Secondary | ICD-10-CM

## 2016-01-26 DIAGNOSIS — E785 Hyperlipidemia, unspecified: Secondary | ICD-10-CM | POA: Diagnosis not present

## 2016-01-26 DIAGNOSIS — C50912 Malignant neoplasm of unspecified site of left female breast: Secondary | ICD-10-CM

## 2016-01-26 LAB — CBC WITH DIFFERENTIAL/PLATELET
Basophils Absolute: 0.1 10*3/uL (ref 0–0.1)
Basophils Relative: 1 %
EOS PCT: 2 %
Eosinophils Absolute: 0.3 10*3/uL (ref 0–0.7)
HCT: 32.7 % — ABNORMAL LOW (ref 35.0–47.0)
Hemoglobin: 10.8 g/dL — ABNORMAL LOW (ref 12.0–16.0)
LYMPHS ABS: 4.5 10*3/uL — AB (ref 1.0–3.6)
LYMPHS PCT: 38 %
MCH: 29.6 pg (ref 26.0–34.0)
MCHC: 33.1 g/dL (ref 32.0–36.0)
MCV: 89.6 fL (ref 80.0–100.0)
MONO ABS: 0.8 10*3/uL (ref 0.2–0.9)
MONOS PCT: 7 %
Neutro Abs: 6.2 10*3/uL (ref 1.4–6.5)
Neutrophils Relative %: 52 %
PLATELETS: 484 10*3/uL — AB (ref 150–440)
RBC: 3.65 MIL/uL — ABNORMAL LOW (ref 3.80–5.20)
RDW: 16.3 % — AB (ref 11.5–14.5)
WBC: 11.9 10*3/uL — ABNORMAL HIGH (ref 3.6–11.0)

## 2016-01-26 LAB — COMPREHENSIVE METABOLIC PANEL
ALT: 13 U/L — AB (ref 14–54)
AST: 21 U/L (ref 15–41)
Albumin: 2.8 g/dL — ABNORMAL LOW (ref 3.5–5.0)
Alkaline Phosphatase: 58 U/L (ref 38–126)
Anion gap: 5 (ref 5–15)
BUN: 18 mg/dL (ref 6–20)
CALCIUM: 8.9 mg/dL (ref 8.9–10.3)
CHLORIDE: 107 mmol/L (ref 101–111)
CO2: 26 mmol/L (ref 22–32)
Creatinine, Ser: 0.6 mg/dL (ref 0.44–1.00)
GLUCOSE: 155 mg/dL — AB (ref 65–99)
POTASSIUM: 3.6 mmol/L (ref 3.5–5.1)
Sodium: 138 mmol/L (ref 135–145)
TOTAL PROTEIN: 5.9 g/dL — AB (ref 6.5–8.1)
Total Bilirubin: 0.4 mg/dL (ref 0.3–1.2)

## 2016-01-26 NOTE — Progress Notes (Signed)
[name]  [phone]  [phone]     [name] DOB: [birth year]  MR#: [id number]  PRF#:[id number]  Patient Care Team: Coral Spikes, DO as PCP - General (Family Medicine)  CHIEF COMPLAINT: Adenocarcinoma of the lower outer quadrant of the left breast cancer  INTERVAL HISTORY:  Patient returns to clinic for routine 6 month follow up.  She continues to fel well and is asymptomatic.  She continues to tolerate Aromasin well without significant side effects. She continues to have a baseline weakness and fatigue.  She has no neurologic complaints.  She denies any recent fevers or illnesses. She denies any chest pain or shortness of breath.  She has no nausea, vomiting, constipation, or diarrhea. She has no urinary complaints.  Patient feels at her baseline and offers no further specific complaints.   REVIEW OF SYSTEMS:   Review of Systems  Constitutional: Positive for malaise/fatigue. Negative for fever and weight loss.  Respiratory: Negative.  Negative for cough and shortness of breath.   Cardiovascular: Negative.  Negative for chest pain.  Gastrointestinal: Negative.  Negative for abdominal pain.  Genitourinary: Negative.   Musculoskeletal: Negative.   Neurological: Positive for weakness.  Psychiatric/Behavioral: Negative.     As per HPI. Otherwise, a complete review of systems is negatve.  PAST MEDICAL HISTORY: Past Medical History:  Diagnosis Date  . Anxiety state 01/01/2015  . Arthritis    "all over"  . Breast cancer ([place])    Hx of R breast cancer s/p mastectomy; Currently has L breast cancer (Diagnosed 2015).   . Breast cancer, left breast ([place]) 01/01/2015  . CKD (chronic kidney disease), stage III 02/18/2015  . Decubitus ulcer of sacral region, stage 2 01/01/2015  . Decubitus ulcer, buttock   . Diabetes mellitus with insulin therapy ([place]) 01/01/2015  . Essential hypertension 01/01/2015  . Glaucoma 01/01/2015  . Glaucoma, bilateral   . History  of breast cancer 01/01/2015  . History of DVT (deep vein thrombosis) 1980's   BLE  . HLD (hyperlipidemia) 01/01/2015  . Hyperlipidemia   . Hypertension   . Itchy scalp 02/10/2015  . Osteoarthritis 01/01/2015  . Recurrent UTI 03/06/2015  . Right knee pain 02/10/2015  . TIA (transient ischemic attack) 02/18/2015  . Type II diabetes mellitus (HCC)    Insulin dependent.     PAST SURGICAL HISTORY: Past Surgical History:  Procedure Laterality Date  . ABDOMINAL HYSTERECTOMY    . BREAST BIOPSY Right 1980's   +  . BREAST EXCISIONAL BIOPSY Left 2015   + stereo no surgery  . CHOLECYSTECTOMY    . MASTECTOMY Right 1980's  . MEDIAL PARTIAL KNEE REPLACEMENT Right 1980's?  . PERIPHERAL VASCULAR CATHETERIZATION  04/28/2015   Procedure: Lower Extremity Intervention;  Surgeon: Algernon Huxley, MD;  Location: [facility];  Service: Cardiovascular;;  . PERIPHERAL VASCULAR CATHETERIZATION N/A 04/28/2015   Procedure: Abdominal Aortogram w/Lower Extremity;  Surgeon: Algernon Huxley, MD;  Location: [facility];  Service: Cardiovascular;  Laterality: N/A;  . PERIPHERAL VASCULAR CATHETERIZATION Left 10/20/2015   Procedure: Lower Extremity Angiography;  Surgeon: Algernon Huxley, MD;  Location: [facility];  Service: Cardiovascular;  Laterality: Left;  . PERIPHERAL VASCULAR CATHETERIZATION  10/20/2015   Procedure: Lower Extremity Intervention;  Surgeon: Algernon Huxley, MD;  Location: [facility];  Service: Cardiovascular;;    FAMILY HISTORY Family History  Problem Relation Age of Onset  . Diabetes Son   . Diabetes Son   .  Heart disease Mother   . Kidney cancer Neg Hx   . Kidney disease Son   . Bladder Cancer Neg Hx   . Prostate cancer Neg Hx     GYNECOLOGIC HISTORY:  No LMP recorded. Patient has had a hysterectomy.     ADVANCED DIRECTIVES:    HEALTH MAINTENANCE: Social History  Substance Use Topics  . Smoking status: Never Smoker  . Smokeless tobacco: Never Used  . Alcohol use  No    No Known Allergies  Current Outpatient Prescriptions  Medication Sig Dispense Refill  . Alcohol Swabs (B-D SINGLE USE SWABS REGULAR) PADS Use as directed. 100 each 11  . ALPHAGAN P 0.1 % SOLN PLACE 1 DROP INTO BOTH EYES 2 (TWO) TIMES DAILY. 5 mL 1  . amLODipine (NORVASC) 5 MG tablet TAKE 1 TABLET (5 MG TOTAL) BY MOUTH DAILY. 90 tablet 2  . B-D UF III MINI PEN NEEDLES 31G X 5 MM MISC     . bimatoprost (LUMIGAN) 0.01 % SOLN Place 1 drop into both eyes at bedtime.    . blood glucose meter kit and supplies Dispense Doctor, hospital per her insurance.  E11.29 1 each 0  . Calcium Carb-Cholecalciferol (CALCIUM 600 + D PO) Take 1 tablet by mouth daily.     . Cholecalciferol (VITAMIN D3) 1000 UNITS CAPS Take 1 capsule by mouth daily.     . clopidogrel (PLAVIX) 75 MG tablet Take 1 tablet (75 mg total) by mouth daily. 90 tablet 2  . CRANBERRY PO Take 500 mg by mouth 2 (two) times daily.     Marland Kitchen exemestane (AROMASIN) 25 MG tablet Take 1 tablet (25 mg total) by mouth at bedtime. (Patient not taking: Reported on 10/20/2015) 30 tablet 6  . Fluocinolone Acetonide 0.01 % SHAM Apply to scalp once daily; work into [employer] and allow to remain on scalp for ~5 minutes then rinse with water. 120 mL 1  . hydrALAZINE (APRESOLINE) 50 MG tablet TAKE 1 TABLET FOUR TIMES DAILY 270 tablet 2  . insulin lispro protamine-lispro (HUMALOG 75/25 MIX) (75-25) 100 UNIT/ML SUSP injection 25 Units 2 (two) times daily with a meal.    . Multiple Vitamins-Minerals (MULTIVITAMIN GUMMIES ADULT PO) Take 1 tablet by mouth daily.     Marland Kitchen omeprazole (PRILOSEC) 20 MG capsule Take 1 capsule (20 mg total) by mouth daily. 90 capsule 2  . ranitidine (ZANTAC) 150 MG capsule TAKE 1 CAPSULE TWICE DAILY 180 capsule 2  . TRUE METRIX BLOOD GLUCOSE TEST test strip TEST 3 TIMES A DAY AS DIRECTED 100 each 2  . TRUEPLUS SAFETY LANCETS 28G MISC Please dispense Prueplus Super thin 28G lancets per insurance preference. E11.29 Use as directed to  check Blood gluscose up toTID 100 each 3  . TRUETEST TEST test strip Use as directed to check blood glucose up to three times a day. Patient uses a [employer]. E11.29 100 each 11   No current facility-administered medications for this visit.     OBJECTIVE: There were no vitals taken for this visit.   There is no height or weight on file to calculate BMI.    ECOG FS:2 - Symptomatic, <50% confined to bed  General: Well-developed, well-nourished, no acute distress. Sitting in wheelchair. Eyes: Pink conjunctiva, anicteric sclera. Lungs: Clear to auscultation bilaterally. Heart: Irregular rate and rhythm. No rubs, murmurs, or gallops. Abdomen: Soft, nontender, nondistended. No organomegaly noted, normoactive bowel sounds. Breast: Right chest without evidence of recurrence. Musculoskeletal: No edema, cyanosis, or  clubbing. Neuro: Alert, answering all questions appropriately. Cranial nerves grossly intact. Skin: No rashes or petechiae noted. Psych: Normal affect.  LAB RESULTS:  No visits with results within 3 Day(s) from this visit.  Latest known visit with results is:  Hospital Outpatient Visit on 10/17/2015  Component Date Value Ref Range Status  . BUN 10/17/2015 17  6 - 20 mg/dL Final  . Creatinine, Ser 10/17/2015 0.82  0.44 - 1.00 mg/dL Final  . GFR calc non Af Amer 10/17/2015 >60  >60 mL/min Final  . GFR calc Af Amer 10/17/2015 >60  >60 mL/min Final   Comment: (NOTE) The eGFR has been calculated using the CKD EPI equation. This calculation has not been validated in all clinical situations. eGFR's persistently <60 mL/min signify possible Chronic Kidney Disease.     STUDIES: No results found.  ASSESSMENT:  Adenocarcinoma of the lower outer quadrant of the left breast cancer.  PLAN:   1. Adenocarcinoma of the lower outer quadrant of the left breast cancer: Patient was originally diagnosed in October 2015 in [place]. At the time of diagnosis patient felt that her  age and performance status was not optimal for excision. Continue Aromasin for at least 5 years through October 2020.  Her most recent Mammogram on March 24, 2015 was reports at as BI-RADS 6, consistent with previously diagnosed malignancy. Repeat in October 2017. Return to clinic in 6 months for further evaluation.  Patient expressed understanding and was in agreement with this plan. She also understands that She can call clinic at any time with any questions, concerns, or complaints.    Lloyd Huger, MD   01/26/2016 12:21 AM

## 2016-01-26 NOTE — Progress Notes (Signed)
States is feeling well. Offers no complaints. 

## 2016-02-03 ENCOUNTER — Encounter: Payer: Self-pay | Admitting: Family Medicine

## 2016-02-03 ENCOUNTER — Ambulatory Visit (INDEPENDENT_AMBULATORY_CARE_PROVIDER_SITE_OTHER): Payer: Medicare PPO | Admitting: Family Medicine

## 2016-02-03 DIAGNOSIS — R634 Abnormal weight loss: Secondary | ICD-10-CM

## 2016-02-03 DIAGNOSIS — L89152 Pressure ulcer of sacral region, stage 2: Secondary | ICD-10-CM

## 2016-02-03 DIAGNOSIS — I10 Essential (primary) hypertension: Secondary | ICD-10-CM

## 2016-02-03 DIAGNOSIS — E1149 Type 2 diabetes mellitus with other diabetic neurological complication: Secondary | ICD-10-CM | POA: Diagnosis not present

## 2016-02-03 DIAGNOSIS — Z23 Encounter for immunization: Secondary | ICD-10-CM

## 2016-02-03 MED ORDER — BIMATOPROST 0.01 % OP SOLN: 1.0000 [drp] | Freq: Every day | OPHTHALMIC | 0 refills | Status: DC

## 2016-02-03 MED ORDER — MIRTAZAPINE 7.5 MG PO TABS: 7.5000 mg | ORAL_TABLET | Freq: Every day | ORAL | 0 refills | Status: DC

## 2016-02-03 MED ORDER — FLUOCINOLONE ACETONIDE 0.01 % EX SHAM: MEDICATED_SHAMPOO | CUTANEOUS | 1 refills | Status: DC

## 2016-02-03 MED ORDER — HYDRALAZINE HCL 50 MG PO TABS: ORAL_TABLET | ORAL | 2 refills | Status: DC

## 2016-02-03 MED ORDER — RANITIDINE HCL 150 MG PO CAPS: 150.0000 mg | ORAL_CAPSULE | Freq: Two times a day (BID) | ORAL | 2 refills | Status: DC

## 2016-02-03 MED ORDER — OMEPRAZOLE 20 MG PO CPDR: 20.0000 mg | DELAYED_RELEASE_CAPSULE | Freq: Every day | ORAL | 2 refills | Status: DC

## 2016-02-03 MED ORDER — CLOPIDOGREL BISULFATE 75 MG PO TABS: 75.0000 mg | ORAL_TABLET | Freq: Every day | ORAL | 2 refills | Status: DC

## 2016-02-03 MED ORDER — EXEMESTANE 25 MG PO TABS: 25.0000 mg | ORAL_TABLET | Freq: Every day | ORAL | 6 refills | Status: DC

## 2016-02-03 MED ORDER — AMLODIPINE BESYLATE 5 MG PO TABS: ORAL_TABLET | ORAL | 2 refills | Status: DC

## 2016-02-03 MED ORDER — BRIMONIDINE TARTRATE 0.1 % OP SOLN: OPHTHALMIC | 1 refills | Status: AC

## 2016-02-03 MED ORDER — BD SWAB SINGLE USE REGULAR PADS: MEDICATED_PAD | 11 refills | Status: DC

## 2016-02-03 NOTE — Progress Notes (Signed)
Pre visit review using our clinic review tool, if applicable. No additional management support is needed unless otherwise documented below in the visit note. 

## 2016-02-03 NOTE — Assessment & Plan Note (Signed)
Difficult to ascertain.  Appetite stable. Secondary from chronic illness and underlying malignancy. Message sent to oncologist. Adding Remeron today.

## 2016-02-03 NOTE — Assessment & Plan Note (Signed)
Stable. Continue Norvasc and Hydralazine.

## 2016-02-03 NOTE — Progress Notes (Signed)
Subjective:  Patient ID: [name], female    DOB: December 20, 1921  Age: 82 y.o. MRN: [id number]  CC: Follow up, concern about weight loss  HPI:  82 year old female with a complicated past medical history including breast cancer (current), stroke, HTN, HLD, DM-2, Sacral decub, PVD presents for follow up.  Weight loss  Patient continues to be concerned about losing weight. Patient states that she feels like she is losing weight.  Unable to obtain accurate weights as patient is debilitated and cannot stand.  Has seen nutrition.   Will discuss today.  DM-2  Sugars stable per daughter. Highest 170's.  No meds at this time (stopped insulin).   HTN  Stable on Norvasc, Hydralazine.  Sacral decub  Stable per daughter. She is providing the wound care.  Social Hx   Social History   Social History  . Marital status: Widowed    Spouse name: N/A  . Number of children: N/A  . Years of education: N/A   Social History Main Topics  . Smoking status: Never Smoker  . Smokeless tobacco: Never Used  . Alcohol use No  . Drug use: No  . Sexual activity: No   Other Topics Concern  . None   Social History Narrative   Lives with daughter/daughter's husband.   Denita Lung [phone]).    Review of Systems  Constitutional: Positive for unexpected weight change.  Psychiatric/Behavioral: Negative.    Objective:  BP 111/67 (BP Location: Left Arm, Patient Position: Sitting, Cuff Size: Normal)   Pulse 84   Temp 98.2 F (36.8 C) (Oral)   Wt 166 lb (75.3 kg) Comment: wheelchair bound.  SpO2 100%   BMI 31.37 kg/m   BP/Weight 02/03/2016 [id number]  Systolic BP 99991111 [id number]  Diastolic BP 67 48 72  Wt. (Lbs) 166 - 132  BMI 31.37 - 24.95   Physical Exam  Constitutional: She is oriented to person, place, and time.  Elderly female. No acute distress.  Cardiovascular: Normal rate and regular rhythm.   Pulmonary/Chest: Effort normal. She has no wheezes. She has no  rales.  Neurological: She is alert and oriented to person, place, and time.  Psychiatric:  In good spirits. Had recent birthday celebrations.  Vitals reviewed.  Lab Results  Component Value Date   WBC 11.9 (H) 01/26/2016   HGB 10.8 (L) 01/26/2016   HCT 32.7 (L) 01/26/2016   PLT 484 (H) 01/26/2016   GLUCOSE 155 (H) 01/26/2016   CHOL 84 02/19/2015   TRIG 88 02/19/2015   HDL 27 (L) 02/19/2015   LDLCALC 39 02/19/2015   ALT 13 (L) 01/26/2016   AST 21 01/26/2016   NA 138 01/26/2016   K 3.6 01/26/2016   CL 107 01/26/2016   CREATININE 0.60 01/26/2016   BUN 18 01/26/2016   CO2 26 01/26/2016   TSH 2.04 09/01/2015   HGBA1C 7.1 (H) 09/01/2015   Assessment & Plan:   Problem List Items Addressed This Visit    Weight loss    Difficult to ascertain.  Appetite stable. Secondary from chronic illness and underlying malignancy. Message sent to oncologist. Adding Remeron today.       Essential hypertension    Stable. Continue Norvasc and Hydralazine.      Relevant Medications   amLODipine (NORVASC) 5 MG tablet   hydrALAZINE (APRESOLINE) 50 MG tablet   DM (diabetes mellitus), type 2 with neurological complications (HCC)    Stable. Attempted A1C today (unable to get blood). Advised close monitoring. No meds at  this time.      Decubitus ulcer of sacral region, stage 2    Patient did not want me to examine today. Stable per daughter.        Other Visit Diagnoses   None.     Meds ordered this encounter  Medications  . Alcohol Swabs (B-D SINGLE USE SWABS REGULAR) PADS    Sig: Use as directed.    Dispense:  100 each    Refill:  11  . brimonidine (ALPHAGAN P) 0.1 % SOLN    Sig: PLACE 1 DROP INTO BOTH EYES 2 (TWO) TIMES DAILY.    Dispense:  5 mL    Refill:  1  . amLODipine (NORVASC) 5 MG tablet    Sig: TAKE 1 TABLET (5 MG TOTAL) BY MOUTH DAILY.    Dispense:  90 tablet    Refill:  2  . bimatoprost (LUMIGAN) 0.01 % SOLN    Sig: Place 1 drop into both eyes at bedtime.      Dispense:  7.5 mL    Refill:  0  . clopidogrel (PLAVIX) 75 MG tablet    Sig: Take 1 tablet (75 mg total) by mouth daily.    Dispense:  90 tablet    Refill:  2  . exemestane (AROMASIN) 25 MG tablet    Sig: Take 1 tablet (25 mg total) by mouth at bedtime.    Dispense:  30 tablet    Refill:  6  . Fluocinolone Acetonide 0.01 % SHAM    Sig: Apply to scalp once daily; work into [employer] and allow to remain on scalp for ~5 minutes then rinse with water.    Dispense:  120 mL    Refill:  1  . hydrALAZINE (APRESOLINE) 50 MG tablet    Sig: TAKE 1 TABLET FOUR TIMES DAILY    Dispense:  270 tablet    Refill:  2  . omeprazole (PRILOSEC) 20 MG capsule    Sig: Take 1 capsule (20 mg total) by mouth daily.    Dispense:  90 capsule    Refill:  2  . ranitidine (ZANTAC) 150 MG capsule    Sig: Take 1 capsule (150 mg total) by mouth 2 (two) times daily.    Dispense:  180 capsule    Refill:  2  . mirtazapine (REMERON) 7.5 MG tablet    Sig: Take 1 tablet (7.5 mg total) by mouth daily.    Dispense:  90 tablet    Refill:  0   Follow-up: 3 months.   [place]

## 2016-02-03 NOTE — Patient Instructions (Addendum)
Continue your current medications. I added Remeron to help keep appetite up and slow weight loss.   Follow up in 3 months  Take care  Dr. Lacinda Axon

## 2016-02-03 NOTE — Assessment & Plan Note (Signed)
Patient did not want me to examine today. Stable per daughter.

## 2016-02-03 NOTE — Assessment & Plan Note (Signed)
Stable. Attempted A1C today (unable to get blood). Advised close monitoring. No meds at this time.

## 2016-02-11 ENCOUNTER — Encounter: Payer: Self-pay | Admitting: Family Medicine

## 2016-02-12 ENCOUNTER — Other Ambulatory Visit: Payer: Self-pay | Admitting: Family Medicine

## 2016-02-12 ENCOUNTER — Other Ambulatory Visit: Payer: Medicare PPO

## 2016-02-12 DIAGNOSIS — R300 Dysuria: Secondary | ICD-10-CM

## 2016-02-12 MED ORDER — GLUCOSE BLOOD VI STRP: ORAL_STRIP | 12 refills | Status: DC

## 2016-02-12 MED ORDER — BD PEN NEEDLE MINI U/F 31G X 5 MM MISC: 11 refills | Status: AC

## 2016-02-12 MED ORDER — BLOOD GLUCOSE METER KIT: PACK | 0 refills | Status: AC

## 2016-02-13 ENCOUNTER — Other Ambulatory Visit: Payer: Self-pay

## 2016-02-13 ENCOUNTER — Other Ambulatory Visit (INDEPENDENT_AMBULATORY_CARE_PROVIDER_SITE_OTHER): Payer: Medicare PPO

## 2016-02-13 ENCOUNTER — Other Ambulatory Visit: Payer: Self-pay | Admitting: Family Medicine

## 2016-02-13 DIAGNOSIS — R300 Dysuria: Secondary | ICD-10-CM

## 2016-02-13 LAB — POCT URINALYSIS DIPSTICK
GLUCOSE UA: NEGATIVE
Nitrite, UA: NEGATIVE
Protein, UA: >=300
SPEC GRAV UA: 1.02
Urobilinogen, UA: 0.2
pH, UA: >=9

## 2016-02-13 MED ORDER — AMOXICILLIN-POT CLAVULANATE 500-125 MG PO TABS: 1.0000 | ORAL_TABLET | Freq: Two times a day (BID) | ORAL | 0 refills | Status: DC

## 2016-02-15 LAB — URINE CULTURE

## 2016-02-19 ENCOUNTER — Other Ambulatory Visit: Payer: Self-pay | Admitting: Family Medicine

## 2016-02-19 MED ORDER — ACCU-CHEK SOFT TOUCH LANCETS MISC: 12 refills | Status: DC

## 2016-02-23 ENCOUNTER — Other Ambulatory Visit: Payer: Self-pay | Admitting: Family Medicine

## 2016-02-23 ENCOUNTER — Encounter: Payer: Self-pay | Admitting: Family Medicine

## 2016-02-23 MED ORDER — AMOXICILLIN-POT CLAVULANATE 500-125 MG PO TABS: 1.0000 | ORAL_TABLET | Freq: Two times a day (BID) | ORAL | 0 refills | Status: DC

## 2016-02-25 ENCOUNTER — Telehealth: Payer: Self-pay | Admitting: *Deleted

## 2016-02-25 NOTE — Telephone Encounter (Signed)
Took off insulin. Shouldn't need.

## 2016-02-25 NOTE — Telephone Encounter (Signed)
Spoke with the pharmacy, discontinued the pen needles, and fixed the lancet order.

## 2016-02-25 NOTE — Telephone Encounter (Signed)
Please advise if she would need these as I don't see any injectable meds.

## 2016-02-25 NOTE — Telephone Encounter (Signed)
[facility] has requested clarity on directions for the pt' pen needles Rx. Please call [phone] , or refax a new Rx to [phone]

## 2016-03-25 ENCOUNTER — Ambulatory Visit
Admission: RE | Admit: 2016-03-25 | Discharge: 2016-03-25 | Disposition: A | Discharge status: Home / Self Care | Payer: Medicare PPO | Source: Ambulatory Visit | Attending: Oncology | Admitting: Oncology

## 2016-03-25 DIAGNOSIS — N6459 Other signs and symptoms in breast: Secondary | ICD-10-CM | POA: Insufficient documentation

## 2016-03-25 DIAGNOSIS — C50512 Malignant neoplasm of lower-outer quadrant of left female breast: Secondary | ICD-10-CM

## 2016-05-14 ENCOUNTER — Encounter (INDEPENDENT_AMBULATORY_CARE_PROVIDER_SITE_OTHER): Payer: Self-pay

## 2016-05-14 ENCOUNTER — Ambulatory Visit (INDEPENDENT_AMBULATORY_CARE_PROVIDER_SITE_OTHER): Payer: Self-pay | Admitting: Vascular Surgery

## 2016-05-14 ENCOUNTER — Other Ambulatory Visit: Payer: Self-pay | Admitting: Family Medicine

## 2016-05-14 ENCOUNTER — Encounter: Payer: Self-pay | Admitting: Family Medicine

## 2016-05-14 DIAGNOSIS — L89152 Pressure ulcer of sacral region, stage 2: Secondary | ICD-10-CM

## 2016-05-20 ENCOUNTER — Encounter: Payer: Medicare PPO | Attending: Surgery | Admitting: Surgery

## 2016-05-20 DIAGNOSIS — Z853 Personal history of malignant neoplasm of breast: Secondary | ICD-10-CM | POA: Insufficient documentation

## 2016-05-20 DIAGNOSIS — Z794 Long term (current) use of insulin: Secondary | ICD-10-CM | POA: Diagnosis not present

## 2016-05-20 DIAGNOSIS — H409 Unspecified glaucoma: Secondary | ICD-10-CM | POA: Insufficient documentation

## 2016-05-20 DIAGNOSIS — E1122 Type 2 diabetes mellitus with diabetic chronic kidney disease: Secondary | ICD-10-CM | POA: Insufficient documentation

## 2016-05-20 DIAGNOSIS — L89153 Pressure ulcer of sacral region, stage 3: Secondary | ICD-10-CM | POA: Insufficient documentation

## 2016-05-20 DIAGNOSIS — I120 Hypertensive chronic kidney disease with stage 5 chronic kidney disease or end stage renal disease: Secondary | ICD-10-CM | POA: Insufficient documentation

## 2016-05-20 DIAGNOSIS — R32 Unspecified urinary incontinence: Secondary | ICD-10-CM | POA: Insufficient documentation

## 2016-05-20 DIAGNOSIS — N186 End stage renal disease: Secondary | ICD-10-CM | POA: Insufficient documentation

## 2016-05-20 DIAGNOSIS — Z9011 Acquired absence of right breast and nipple: Secondary | ICD-10-CM | POA: Insufficient documentation

## 2016-05-20 DIAGNOSIS — E785 Hyperlipidemia, unspecified: Secondary | ICD-10-CM | POA: Insufficient documentation

## 2016-05-20 DIAGNOSIS — M1990 Unspecified osteoarthritis, unspecified site: Secondary | ICD-10-CM | POA: Insufficient documentation

## 2016-05-20 DIAGNOSIS — I739 Peripheral vascular disease, unspecified: Secondary | ICD-10-CM | POA: Diagnosis not present

## 2016-05-20 DIAGNOSIS — E11622 Type 2 diabetes mellitus with other skin ulcer: Secondary | ICD-10-CM | POA: Diagnosis present

## 2016-05-21 ENCOUNTER — Encounter: Payer: Self-pay | Admitting: Family Medicine

## 2016-05-21 ENCOUNTER — Other Ambulatory Visit: Payer: Self-pay | Admitting: Family Medicine

## 2016-05-21 MED ORDER — HYDROCOD POLST-CPM POLST ER 10-8 MG/5ML PO SUER: 5.0000 mL | Freq: Two times a day (BID) | ORAL | 0 refills | Status: DC | PRN

## 2016-05-21 NOTE — Progress Notes (Signed)
[name], [name] ([id number]) Visit Report for 05/20/2016 Allergy List Details Patient Name: BG, [name] Date of Service: 05/20/2016 8:15 AM Medical Record Number: [id number] Patient Account Number: [id number] Date of Birth/Sex: 1922/04/08 (82 y.o. Female) Treating RN: Cornell Barman Primary Care Physician: [name] Other Clinician: Referring Physician: [name] Treating Physician/Extender: Frann Rider in Treatment: 0 Allergies Active Allergies No Known Allergies Allergy Notes Electronic Signature(s) Signed: 05/20/2016 5:13:44 PM By: Gretta Cool, RN, BSN, Kim RN, BSN Entered By: Gretta Cool, RN, BSN, Kim on 05/20/2016 08:29:35 [name] ([id number]) -------------------------------------------------------------------------------- Arrival Information Details Patient Name: [name] Date of Service: 05/20/2016 8:15 AM Medical Record Number: [id number] Patient Account Number: [id number] Date of Birth/Sex: December 27, 1921 (82 y.o. Female) Treating RN: Cornell Barman Primary Care Physician: [name] Other Clinician: Referring Physician: [name] Treating Physician/Extender: Frann Rider in Treatment: 0 Visit Information Patient Arrived: Wheel Chair Arrival Time: 08:26 Accompanied By: daughter Transfer Assistance: Manual Patient Identification Verified: Yes Secondary Verification Process Yes Completed: Patient Requires Transmission-Based No Precautions: Patient Has Alerts: Yes Patient Alerts: DM II History Since Last Visit Electronic Signature(s) Signed: 05/20/2016 5:13:44 PM By: Gretta Cool, RN, BSN, Kim RN, BSN Entered By: Gretta Cool, RN, BSN, Kim on 05/20/2016 08:27:55 [name] ([id number]) -------------------------------------------------------------------------------- Clinic Level of Care Assessment Details Patient Name: [name] Date of Service: 05/20/2016 8:15 AM Medical Record Number: [id number] Patient Account Number: [id number] Date of Birth/Sex: 1922-01-17 (82 y.o.  Female) Treating RN: Cornell Barman Primary Care Physician: [name] Other Clinician: Referring Physician: [name] Treating Physician/Extender: Frann Rider in Treatment: 0 Clinic Level of Care Assessment Items TOOL 2 Quantity Score []  - Use when only an EandM is performed on the INITIAL visit 0 ASSESSMENTS - Nursing Assessment / Reassessment X - General Physical Exam (combine w/ comprehensive assessment (listed just 1 20 below) when performed on new pt. evals) X - Comprehensive Assessment (HX, ROS, Risk Assessments, Wounds Hx, etc.) 1 25 ASSESSMENTS - Wound and Skin Assessment / Reassessment X - Simple Wound Assessment / Reassessment - one wound 1 5 []  - Complex Wound Assessment / Reassessment - multiple wounds 0 []  - Dermatologic / Skin Assessment (not related to wound area) 0 ASSESSMENTS - Ostomy and/or Continence Assessment and Care []  - Incontinence Assessment and Management 0 []  - Ostomy Care Assessment and Management (repouching, etc.) 0 PROCESS - Coordination of Care X - Simple Patient / Family Education for ongoing care 1 15 []  - Complex (extensive) Patient / Family Education for ongoing care 0 X - Staff obtains Programmer, systems, Records, Test Results / Process Orders 1 10 []  - Staff telephones HHA, Nursing Homes / Clarify orders / etc 0 []  - Routine Transfer to another Facility (non-emergent condition) 0 []  - Routine Hospital Admission (non-emergent condition) 0 []  - New Admissions / Biomedical engineer / Ordering NPWT, Apligraf, etc. 0 X - Emergency Hospital Admission (emergent condition) 1 20 X - Simple Discharge Coordination 1 10 [name], [name] ([id number]) []  - Complex (extensive) Discharge Coordination 0 PROCESS - Special Needs []  - Pediatric / Minor Patient Management 0 []  - Isolation Patient Management 0 []  - Hearing / Language / Visual special needs 0 []  - Assessment of Community assistance (transportation, D/C planning, etc.) 0 []  - Additional assistance  / Altered mentation 0 []  - Support Surface(s) Assessment (bed, cushion, seat, etc.) 0 INTERVENTIONS - Wound Cleansing / Measurement X - Wound Imaging (photographs - any number of wounds) 1 5 []  - Wound Tracing (instead of photographs) 0 X - Simple Wound Measurement - one wound  1 5 []  - Complex Wound Measurement - multiple wounds 0 X - Simple Wound Cleansing - one wound 1 5 []  - Complex Wound Cleansing - multiple wounds 0 INTERVENTIONS - Wound Dressings X - Small Wound Dressing one or multiple wounds 1 10 []  - Medium Wound Dressing one or multiple wounds 0 []  - Large Wound Dressing one or multiple wounds 0 []  - Application of Medications - injection 0 INTERVENTIONS - Miscellaneous []  - External ear exam 0 []  - Specimen Collection (cultures, biopsies, blood, body fluids, etc.) 0 []  - Specimen(s) / Culture(s) sent or taken to Lab for analysis 0 []  - Patient Transfer (multiple staff / Harrel Lemon Lift / Similar devices) 0 []  - Simple Staple / Suture removal (25 or less) 0 []  - Complex Staple / Suture removal (26 or more) 0 [name], [name] ([id number]) []  - Hypo / Hyperglycemic Management (close monitor of Blood Glucose) 0 []  - Ankle / Brachial Index (ABI) - do not check if billed separately 0 Has the patient been seen at the hospital within the last three years: Yes Total Score: 130 Level Of Care: New/Established - Level 4 Electronic Signature(s) Signed: 05/20/2016 5:13:44 PM By: Gretta Cool, RN, BSN, Kim RN, BSN Entered By: Gretta Cool, RN, BSN, Kim on 05/20/2016 09:16:42 [name] ([id number]) -------------------------------------------------------------------------------- Encounter Discharge Information Details Patient Name: [name] Date of Service: 05/20/2016 8:15 AM Medical Record Number: [id number] Patient Account Number: [id number] Date of Birth/Sex: 04/08/1922 (82 y.o. Female) Treating RN: Cornell Barman Primary Care Physician: [name] Other Clinician: Referring Physician: [name] Treating Physician/Extender: Frann Rider in Treatment: 0 Encounter Discharge Information Items Discharge Pain Level: 0 Discharge Condition: Stable Ambulatory Status: Wheelchair Discharge Destination: Home Transportation: Private Auto daughter and Accompanied By: son in law Schedule Follow-up Appointment: Yes Medication Reconciliation completed and provided to Patient/Care Yes Deshawn Witty: Provided on Clinical Summary of Care: 05/20/2016 Form Type Recipient Paper Patient VD Electronic Signature(s) Signed: 05/20/2016 5:13:44 PM By: Gretta Cool RN, BSN, Kim RN, BSN Previous Signature: 05/20/2016 9:17:58 AM Version By: Ruthine Dose Entered By: Gretta Cool RN, BSN, Kim on 05/20/2016 09:18:35 [name] ([id number]) -------------------------------------------------------------------------------- Lower Extremity Assessment Details Patient Name: [name] Date of Service: 05/20/2016 8:15 AM Medical Record Number: [id number] Patient Account Number: [id number] Date of Birth/Sex: [birth year] (82 y.o. Female) Treating RN: Cornell Barman Primary Care Physician: [name] Other Clinician: Referring Physician: [name] Treating Physician/Extender: Frann Rider in Treatment: 0 Electronic Signature(s) Signed: 05/20/2016 5:13:44 PM By: Gretta Cool RN, BSN, Kim RN, BSN Entered By: Gretta Cool, RN, BSN, Kim on 05/20/2016 08:43:03 [name], [name] ([id number]) -------------------------------------------------------------------------------- Multi Wound Chart Details Patient Name: [name] Date of Service: 05/20/2016 8:15 AM Medical Record Number: [id number] Patient Account Number: [id number] Date of Birth/Sex: [birth year] (82 y.o. Female) Treating RN: Cornell Barman Primary Care Physician: [name] Other Clinician: Referring Physician: [name] Treating Physician/Extender: Frann Rider in Treatment: 0 Vital Signs Height(in): Pulse(bpm): 50 Weight(lbs): Blood  Pressure 202/80 (mmHg): Body Mass Index(BMI): Temperature(F): 98.1 Respiratory Rate 16 (breaths/min): Photos: [2:No Photos] [N/A:N/A] Wound Location: [2:Sacrum - Medial] [N/A:N/A] Wounding Event: [2:Pressure Injury] [N/A:N/A] Primary Etiology: [2:Pressure Ulcer] [N/A:N/A] Comorbid History: [2:Glaucoma, Hypertension, Type II Diabetes, End Stage Renal Disease, Osteoarthritis] [N/A:N/A] Date Acquired: [2:04/20/2016] [N/A:N/A] Weeks of Treatment: [2:0] [N/A:N/A] Wound Status: [2:Open] [N/A:N/A] Clustered Wound: [2:Yes] [N/A:N/A] Measurements L x W x D 0.6x0.4x0.1 [N/A:N/A] (cm) Area (cm) : [2:0.188] [N/A:N/A] Volume (cm) : [2:0.019] [N/A:N/A] Classification: [2:Category/Stage II] [N/A:N/A] Exudate Amount: [2:None Present] [N/A:N/A] Wound Margin: [2:Flat and Intact] [N/A:N/A] Granulation Amount: [2:Large (67-100%)] [  N/A:N/A] Granulation Quality: [2:Red, Pink] [N/A:N/A] Necrotic Amount: [2:None Present (0%)] [N/A:N/A] Exposed Structures: [2:Fascia: No Fat: No Tendon: No Muscle: No Joint: No Bone: No Limited to Skin Breakdown] [N/A:N/A] Epithelialization: Medium (34-66%) N/A N/A Periwound Skin Texture: Scarring: Yes N/A N/A Edema: No Excoriation: No Induration: No Callus: No Crepitus: No Fluctuance: No Friable: No Rash: No Periwound Skin Maceration: No N/A N/A Moisture: Moist: No Dry/Scaly: No Periwound Skin Color: Ecchymosis: Yes N/A N/A Atrophie Blanche: No Cyanosis: No Erythema: No Hemosiderin Staining: No Mottled: No Pallor: No Rubor: No Temperature: No Abnormality N/A N/A Tenderness on No N/A N/A Palpation: Wound Preparation: Ulcer Cleansing: N/A N/A Rinsed/Irrigated with Saline Topical Anesthetic Applied: None Treatment Notes Electronic Signature(s) Signed: 05/20/2016 5:13:44 PM By: Gretta Cool, RN, BSN, Kim RN, BSN Entered By: Gretta Cool, RN, BSN, Kim on 05/20/2016 08:55:04 [name]  ([id number]) -------------------------------------------------------------------------------- Multi-Disciplinary Care Plan Details Patient Name: [name] Date of Service: 05/20/2016 8:15 AM Medical Record Number: [id number] Patient Account Number: [id number] Date of Birth/Sex: March 02, 1922 (82 y.o. Female) Treating RN: Cornell Barman Primary Care Physician: [name] Other Clinician: Referring Physician: [name] Treating Physician/Extender: Frann Rider in Treatment: 0 Active Inactive Abuse / Safety / Falls / Self Care Management Nursing Diagnoses: Impaired physical mobility Potential for falls Goals: Patient will remain injury free Date Initiated: 05/20/2016 Goal Status: Active Interventions: Assess: immobility, friction, shearing, incontinence upon admission and as needed Provide education on safe transfers Notes: Nutrition Nursing Diagnoses: Impaired glucose control: actual or potential Goals: Patient/caregiver will maintain therapeutic glucose control Date Initiated: 05/20/2016 Goal Status: Active Interventions: Assess patient nutrition upon admission and as needed per policy Notes: Orientation to the Wound Care Program Nursing Diagnoses: Knowledge deficit related to the wound healing center program Goals: [name], [name] ([id number]) Patient/caregiver will verbalize understanding of the [facility] Date Initiated: 05/20/2016 Goal Status: Active Interventions: Provide education on orientation to the wound center Notes: Pressure Nursing Diagnoses: Potential for impaired tissue integrity related to pressure, friction, moisture, and shear Goals: Patient will remain free from development of additional pressure ulcers Date Initiated: 05/20/2016 Goal Status: Active Patient/caregiver will verbalize understanding of pressure ulcer management Date Initiated: 05/20/2016 Goal Status: Active Interventions: Assess: immobility, friction, shearing,  incontinence upon admission and as needed Assess potential for pressure ulcer upon admission and as needed Treatment Activities: Patient referred for home evaluation of offloading devices/mattresses : 05/20/2016 Notes: Wound/Skin Impairment Nursing Diagnoses: Impaired tissue integrity Goals: Ulcer/skin breakdown will heal within 14 weeks Date Initiated: 05/20/2016 Goal Status: Active Interventions: Assess patient/caregiver ability to perform ulcer/skin care regimen upon admission and as needed Notes: Electronic Signature(s) [name], [name] ([id number]) Signed: 05/20/2016 5:13:44 PM By: Gretta Cool, RN, BSN, Kim RN, BSN Entered By: Gretta Cool, RN, BSN, Kim on 05/20/2016 08:54:28 [name], [name] ([id number]) -------------------------------------------------------------------------------- Pain Assessment Details Patient Name: [name] Date of Service: 05/20/2016 8:15 AM Medical Record Number: [id number] Patient Account Number: [id number] Date of Birth/Sex: 1922/05/16 (82 y.o. Female) Treating RN: Cornell Barman Primary Care Physician: [name] Other Clinician: Referring Physician: [name] Treating Physician/Extender: Frann Rider in Treatment: 0 Active Problems Location of Pain Severity and Description of Pain Patient Has Paino No Site Locations With Dressing Change: No Pain Management and Medication Current Pain Management: Electronic Signature(s) Signed: 05/20/2016 5:13:44 PM By: Gretta Cool, RN, BSN, Kim RN, BSN Entered By: Gretta Cool, RN, BSN, Kim on 05/20/2016 08:28:15 [name] ([id number]) -------------------------------------------------------------------------------- Patient/Caregiver Education Details Patient Name: [name] Date of Service: 05/20/2016 8:15 AM Medical Record Number: [id number] Patient Account Number: [id number] Date of  Birth/Gender: 10-21-1921 (82 y.o. Female) Treating RN: Cornell Barman Primary Care Physician: [name] Other Clinician: Referring Physician: [name] Treating Physician/Extender: Frann Rider in Treatment: 0 Education Assessment Education Provided To: Patient and Caregiver Education Topics Provided Safety: Handouts: Safe Transfers Methods: Demonstration, Explain/Verbal Responses: State content correctly Welcome To [facility]: Handouts: [facility] Methods: Explain/Verbal Responses: State content correctly Wound/Skin Impairment: Handouts: Caring for Your Ulcer, Other: wound care as rescribed; supplies ordered from [facility] Methods: Explain/Verbal Responses: State content correctly Electronic Signature(s) Signed: 05/20/2016 5:13:44 PM By: Gretta Cool, RN, BSN, Kim RN, BSN Entered By: Gretta Cool, RN, BSN, Kim on 05/20/2016 09:21:08 [name] ([id number]) -------------------------------------------------------------------------------- Wound Assessment Details Patient Name: [name] Date of Service: 05/20/2016 8:15 AM Medical Record Number: [id number] Patient Account Number: [id number] Date of Birth/Sex: Jul 27, 1921 (82 y.o. Female) Treating RN: Cornell Barman Primary Care Physician: [name] Other Clinician: Referring Physician: [name] Treating Physician/Extender: Frann Rider in Treatment: 0 Wound Status Wound Number: 2 Primary Pressure Ulcer Etiology: Wound Location: Sacrum - Medial Wound Open Wounding Event: Pressure Injury Status: Date Acquired: 04/20/2016 Comorbid Glaucoma, Hypertension, Type II Weeks Of Treatment: 0 History: Diabetes, End Stage Renal Disease, Clustered Wound: Yes Osteoarthritis Wound Measurements Length: (cm) 0.6 Width: (cm) 0.4 Depth: (cm) 0.1 Area: (cm) 0.188 Volume: (cm) 0.019 % Reduction in Area: 0% % Reduction in Volume: 0% Epithelialization: Medium (34-66%) Tunneling: No Undermining: No Wound Description Classification: Category/Stage II Wound Margin: Flat and Intact Exudate Amount: Medium Exudate Type: Serous Exudate  Color: amber Foul Odor After Cleansing: No Wound Bed Granulation Amount: Large (67-100%) Exposed Structure Granulation Quality: Red, Pink Fascia Exposed: No Necrotic Amount: None Present (0%) Fat Layer Exposed: No Tendon Exposed: No Muscle Exposed: No Joint Exposed: No Bone Exposed: No Limited to Skin Breakdown Periwound Skin Texture Texture Color No Abnormalities Noted: No No Abnormalities Noted: No Callus: No Atrophie Blanche: No Crepitus: No Cyanosis: No Excoriation: No Ecchymosis: Yes [name], [name] ([id number]) Fluctuance: No Erythema: No Friable: No Hemosiderin Staining: No Induration: No Mottled: No Localized Edema: No Pallor: No Rash: No Rubor: No Scarring: Yes Temperature / Pain Moisture Temperature: No Abnormality No Abnormalities Noted: No Dry / Scaly: No Maceration: No Moist: No Wound Preparation Ulcer Cleansing: Rinsed/Irrigated with Saline Topical Anesthetic Applied: None Treatment Notes Wound #2 (Medial Sacrum) 1. Cleansed with: Clean wound with Normal Saline 4. Dressing Applied: Prisma Ag 5. Secondary Dressing Applied Bordered Foam Dressing Electronic Signature(s) Signed: 05/20/2016 5:13:44 PM By: Gretta Cool, RN, BSN, Kim RN, BSN Entered By: Gretta Cool, RN, BSN, Kim on 05/20/2016 09:19:41 [name] ([id number]) -------------------------------------------------------------------------------- Vitals Details Patient Name: [name] Date of Service: 05/20/2016 8:15 AM Medical Record Number: [id number] Patient Account Number: [id number] Date of Birth/Sex: July 21, 1921 (82 y.o. Female) Treating RN: Cornell Barman Primary Care Physician: [name] Other Clinician: Referring Physician: [name] Treating Physician/Extender: Frann Rider in Treatment: 0 Vital Signs Time Taken: 08:28 Temperature (F): 98.1 Pulse (bpm): 50 Respiratory Rate (breaths/min): 16 Blood Pressure (mmHg): 202/80 Reference Range: 80 - 120 mg / dl Notes patient takes  medication for BP. She did not take them this morning. MD notified. Instructed patient to take medication as soon as possible and follow up with PCP. Electronic Signature(s) Signed: 05/20/2016 5:13:44 PM By: Gretta Cool, RN, BSN, Kim RN, BSN Entered By: Gretta Cool, RN, BSN, Kim on 05/20/2016 09:06:50

## 2016-05-21 NOTE — Progress Notes (Signed)
[name], [name] ([id number]) Visit Report for 05/20/2016 Abuse/Suicide Risk Screen Details Patient Name: [name], [name] Date of Service: 05/20/2016 8:15 AM Medical Record Number: [id number] Patient Account Number: [id number] Date of Birth/Sex: 1921/10/01 (82 y.o. Female) Treating RN: Cornell Barman Primary Care Physician: [name] Other Clinician: Referring Physician: [name] Treating Physician/Extender: Frann Rider in Treatment: 0 Abuse/Suicide Risk Screen Items Answer ABUSE/SUICIDE RISK SCREEN: Has anyone close to you tried to hurt or harm you recentlyo No Do you feel uncomfortable with anyone in your familyo No Has anyone forced you do things that you didnot want to doo No Do you have any thoughts of harming yourselfo No Patient displays signs or symptoms of abuse and/or neglect. No Electronic Signature(s) Signed: 05/20/2016 5:13:44 PM By: Gretta Cool, RN, BSN, Kim RN, BSN Entered By: Gretta Cool, RN, BSN, Kim on 05/20/2016 08:38:07 [name] ([id number]) -------------------------------------------------------------------------------- Activities of Daily Living Details Patient Name: [name] Date of Service: 05/20/2016 8:15 AM Medical Record Number: [id number] Patient Account Number: [id number] Date of Birth/Sex: 1921-11-02 (82 y.o. Female) Treating RN: Cornell Barman Primary Care Physician: [name] Other Clinician: Referring Physician: [name] Treating Physician/Extender: Frann Rider in Treatment: 0 Activities of Daily Living Items Answer Activities of Daily Living (Please select one for each item) Drive Automobile Not Able Take Medications Need Assistance Use Telephone Need Assistance Care for Appearance Need Assistance Use Toilet Need Assistance Bath / Shower Need Assistance Dress Self Need Assistance Feed Self Need Assistance Walk Not Able Get In / Out Bed Need Assistance Housework Not Able Prepare Meals Not Able Handle Money Need Assistance Shop for  Self Need Assistance Electronic Signature(s) Signed: 05/20/2016 5:13:44 PM By: Gretta Cool, RN, BSN, Kim RN, BSN Entered By: Gretta Cool, RN, BSN, Kim on 05/20/2016 08:39:13 [name] ([id number]) -------------------------------------------------------------------------------- Education Assessment Details Patient Name: [name] Date of Service: 05/20/2016 8:15 AM Medical Record Number: [id number] Patient Account Number: [id number] Date of Birth/Sex: 18-Dec-1921 (82 y.o. Female) Treating RN: Cornell Barman Primary Care Physician: [name] Other Clinician: Referring Physician: [name] Treating Physician/Extender: Frann Rider in Treatment: 0 Primary Learner Assessed: Caregiver Patient physically Reason Patient is not Primary Learner: unable to care for herself, but is [employer] Learning Preferences/Education Level/Primary Language Learning Preference: Explanation, Demonstration Highest Education Level: Grade School Preferred Language: English Cognitive Barrier Assessment/Beliefs Language Barrier: No Translator Needed: No Memory Deficit: No Emotional Barrier: No Cultural/Religious Beliefs Affecting Medical No Care: Physical Barrier Assessment Impaired Vision: No Impaired Hearing: No Decreased Hand dexterity: No Knowledge/Comprehension Assessment Knowledge Level: High Comprehension Level: High Ability to understand written High instructions: Ability to understand verbal High instructions: Motivation Assessment Anxiety Level: Calm Cooperation: Cooperative Education Importance: Acknowledges Need Interest in Health Problems: Asks Questions Perception: Coherent Willingness to Engage in Self- High Management Activities: High [name], [name] ([id number]) Readiness to Engage in Self- Management Activities: Electronic Signature(s) Signed: 05/20/2016 5:13:44 PM By: Gretta Cool, RN, BSN, Kim RN, BSN Entered By: Gretta Cool, RN, BSN, Kim on 05/20/2016 08:41:23 [name], [name]  ([id number]) -------------------------------------------------------------------------------- Fall Risk Assessment Details Patient Name: [name] Date of Service: 05/20/2016 8:15 AM Medical Record Number: [id number] Patient Account Number: [id number] Date of Birth/Sex: 1922-02-21 (82 y.o. Female) Treating RN: Cornell Barman Primary Care Physician: [name] Other Clinician: Referring Physician: [name] Treating Physician/Extender: Frann Rider in Treatment: 0 Fall Risk Assessment Items Have you had 2 or more falls in the last 12 monthso 0 Yes Have you had any fall that resulted in injury in the last 12 monthso 0 Yes FALL RISK ASSESSMENT: History  of falling - immediate or within 3 months 0 No Secondary diagnosis 0 No Ambulatory aid None/bed rest/wheelchair/nurse 0 Yes Crutches/cane/walker 0 No Furniture 0 No IV Access/Saline Lock 0 No Gait/Training Normal/bed rest/immobile 0 Yes Weak 0 No Impaired 0 No Mental Status Oriented to own ability 0 Yes Electronic Signature(s) Signed: 05/20/2016 5:13:44 PM By: Gretta Cool, RN, BSN, Kim RN, BSN Entered By: Gretta Cool, RN, BSN, Kim on 05/20/2016 08:41:43 [name], [name] ([id number]) -------------------------------------------------------------------------------- Nutrition Risk Assessment Details Patient Name: [name] Date of Service: 05/20/2016 8:15 AM Medical Record Number: [id number] Patient Account Number: [id number] Date of Birth/Sex: 20-May-1922 (82 y.o. Female) Treating RN: Cornell Barman Primary Care Physician: [name] Other Clinician: Referring Physician: [name] Treating Physician/Extender: Frann Rider in Treatment: 0 Height (in): Weight (lbs): Body Mass Index (BMI): Nutrition Risk Assessment Items NUTRITION RISK SCREEN: I have an illness or condition that made me change the kind and/or 0 No amount of food I eat I eat fewer than two meals per day 0 No I eat few fruits and vegetables, or milk products 0 No I  have three or more drinks of beer, liquor or wine almost every day 0 No I have tooth or mouth problems that make it hard for me to eat 0 No I don't always have enough money to buy the food I need 0 No I eat alone most of the time 0 No I take three or more different prescribed or over-the-counter drugs a 1 Yes day Without wanting to, I have lost or gained 10 pounds in the last six 2 Yes months I am not always physically able to shop, cook and/or feed myself 0 No Nutrition Protocols Good Risk Protocol Provide education on elevated blood sugars Moderate Risk Protocol 0 and impact on wound healing, as applicable Electronic Signature(s) Signed: 05/20/2016 5:13:44 PM By: Gretta Cool, RN, BSN, Kim RN, BSN Entered By: Gretta Cool, RN, BSN, Kim on 05/20/2016 08:42:34

## 2016-05-21 NOTE — Progress Notes (Addendum)
RYELEE, FLORIA ([id number]) Visit Report for 05/20/2016 Chief Complaint Document Details Patient Name: [name], [name] Date of Service: 05/20/2016 8:15 AM Medical Record Number: [id number] Patient Account Number: [id number] Date of Birth/Sex: 12-Nov-1921 (82 y.o. Female) Treating RN: Cornell Barman Primary Care Physician: [name] Other Clinician: Referring Physician: [name] Treating Physician/Extender: Frann Rider in Treatment: 0 Information Obtained from: Patient Chief Complaint Patient returns to the wound care center for new ulcer to: the sacral region which she's had for about 2 months now Electronic Signature(s) Signed: 05/20/2016 9:15:45 AM By: Christin Fudge MD, FACS Entered By: Christin Fudge on 05/20/2016 09:15:45 [name] ([id number]) -------------------------------------------------------------------------------- HPI Details Patient Name: [name] Date of Service: 05/20/2016 8:15 AM Medical Record Number: [id number] Patient Account Number: [id number] Date of Birth/Sex: 1922/04/22 (82 y.o. Female) Treating RN: Montey Hora Primary Care Physician: [name] Other Clinician: Referring Physician: [name] Treating Physician/Extender: Frann Rider in Treatment: 0 History of Present Illness Location: sacral region Quality: Patient reports No Pain. Severity: Patient states wound are getting worse. Duration: Patient has had the wound for > 3 months prior to seeking treatment at the wound center Timing: Pain in wound is Intermittent (comes and goes Context: The wound appeared gradually over time Modifying Factors: Other treatment(s) tried include:local ointments Associated Signs and Symptoms: Patient reports having difficulty standing for long periods. HPI Description: 82 year old patient who was seen recently by her PCP Dr. Lacinda Axon who has been treating her for a decubitus ulcer of the sacral region, diabetes mellitus type 2, weight loss, essential  hypertension. her past history is also significant for breast cancer, stroke and peripheral vascular disease. She had a vascular workup done in May 2017 where to do saw her for her peripheral arterial disease with rest pain in the left lower extremity and did an aortogram and selective left lower extremity angiogram. the findings were that of occlusion of the bypass which terminated in either the posterior tibial artery which was excluded or a posterior tibial artery with poor runoff. Native SFA was occluded with reconstitution of a diseased popliteal artery with the peroneal artery being best runoff distally it appears the bypass may have been based on the native SFA although it was not entirely clear. last hemoglobin A1c in March of this year was 7.1 Old note: 82 year old female with a past medical history of type 2 diabetes, hypertension, hyperlipidemia, history of breast cancer (right) and current cancer of the left breast, osteoarthritis, and glaucoma presents to the wound clinic today a consult of a wound on her sacral region. This has been there for about 3 months now. Her last hemoglobin A1c was 6.4 and her glucose was 184. She is not bed bound and is ambulating with help and uses a walker and sits on a lift chair for most of the day. Electronic Signature(s) Signed: 05/20/2016 9:16:23 AM By: Christin Fudge MD, FACS Previous Signature: 05/20/2016 8:44:54 AM Version By: Christin Fudge MD, FACS Previous Signature: 05/20/2016 8:44:35 AM Version By: Christin Fudge MD, FACS Previous Signature: 05/20/2016 8:42:51 AM Version By: Christin Fudge MD, FACS Entered By: Christin Fudge on 05/20/2016 09:16:23 [name] ([id number]) -------------------------------------------------------------------------------- Physical Exam Details Patient Name: [name] Date of Service: 05/20/2016 8:15 AM Medical Record Number: [id number] Patient Account Number: [id number] Date of Birth/Sex: 05/23/1922 (82 y.o.  Female) Treating RN: Cornell Barman Primary Care Physician: [name] Other Clinician: Referring Physician: [name] Treating Physician/Extender: Frann Rider in Treatment: 0 Constitutional . Pulse regular. Respirations normal and unlabored. Afebrile. Marland Kitchen  Eyes Nonicteric. Reactive to light. Ears, Nose, Mouth, and Throat Lips, teeth, and gums WNL.Marland Kitchen Moist mucosa without lesions. Neck supple and nontender. No palpable supraclavicular or cervical adenopathy. Normal sized without goiter. Respiratory WNL. No retractions.. Cardiovascular Pedal Pulses WNL. No clubbing, cyanosis or edema. Chest Breasts symmetical and no nipple discharge.. Breast tissue WNL, no masses, lumps, or tenderness.. Gastrointestinal (GI) Abdomen without masses or tenderness.. No liver or spleen enlargement or tenderness.. Lymphatic No adneopathy. No adenopathy. No adenopathy. Musculoskeletal Adexa without tenderness or enlargement.. Digits and nails w/o clubbing, cyanosis, infection, petechiae, ischemia, or inflammatory conditions.. Integumentary (Hair, Skin) No suspicious lesions. No crepitus or fluctuance. No peri-wound warmth or erythema. No masses.Marland Kitchen Psychiatric Judgement and insight Intact.. No evidence of depression, anxiety, or agitation.. Notes she has small contiguous ulcers in the sacral region and the deepest is in the left gluteal area medially and this is a stage III. She has other stage II pressure injuries and the area does not macerated. Electronic Signature(s) Signed: 05/20/2016 9:17:04 AM By: Christin Fudge MD, FACS Entered By: Christin Fudge on 05/20/2016 09:17:02 [name] ([id number]) -------------------------------------------------------------------------------- Physician Orders Details Patient Name: [name] Date of Service: 05/20/2016 8:15 AM Medical Record Number: [id number] Patient Account Number: [id number] Date of Birth/Sex: Mar 26, 1922 (82 y.o. Female) Treating RN: Cornell Barman Primary Care Physician: [name] Other Clinician: Referring Physician: [name] Treating Physician/Extender: Frann Rider in Treatment: 0 Verbal / Phone Orders: Yes Clinician: Cornell Barman Read Back and Verified: Yes Diagnosis Coding Wound Cleansing Wound #2 Medial Sacrum o Clean wound with Normal Saline. Primary Wound Dressing Wound #2 Medial Sacrum o Prisma Ag Secondary Dressing Wound #2 Medial Sacrum o Boardered Foam Dressing Dressing Change Frequency Wound #2 Medial Sacrum o Change dressing every other day. Follow-up Appointments Wound #2 Medial Sacrum o Return Appointment in 1 week. Off-Loading Wound #2 Medial Sacrum o Roho cushion for wheelchair - Evaluation from [employer] Additional Orders / Instructions Wound #2 Medial Sacrum o Increase protein intake. 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 Female) Treating RN: Cornell Barman Primary Care Physician: [name] Other Clinician: Referring Physician: [name] Treating Physician/Extender: Frann Rider in Treatment: 0 Active Problems ICD-10 Encounter Code Description Active Date Diagnosis E11.622 Type 2 diabetes mellitus with other skin ulcer 05/20/2016 Yes L89.153 Pressure ulcer of sacral region, stage 3 05/20/2016 Yes I73.9 Peripheral vascular disease, unspecified 05/20/2016 Yes Inactive Problems Resolved Problems Electronic Signature(s) Signed: 05/20/2016  9:15:12 AM By: Christin Fudge MD, FACS Entered By: Christin Fudge on 05/20/2016 09:15:11 [place], [name] ([id number]) -------------------------------------------------------------------------------- Progress Note Details Patient Name: [name] Date of Service: 05/20/2016 8:15 AM Medical Record Number: [id number] Patient Account Number: [id number] Date of Birth/Sex: 02/05/1922 (82 y.o. Female) Treating RN: Cornell Barman Primary Care Physician: [name] Other Clinician: Referring Physician: [name] Treating Physician/Extender: Frann Rider in Treatment: 0 Subjective Chief Complaint Information obtained from Patient Patient returns to the wound care center for new ulcer to: the sacral region which she's had for about 2 months now History of Present Illness (HPI) The following HPI elements were documented for the patient's wound: Location: sacral region Quality: Patient reports  No Pain. Severity: Patient states wound are getting worse. Duration: Patient has had the wound for > 3 months prior to seeking treatment at the wound center Timing: Pain in wound is Intermittent (comes and goes Context: The wound appeared gradually over time Modifying Factors: Other treatment(s) tried include:local ointments Associated Signs and Symptoms: Patient reports having difficulty standing for long periods. 82 year old patient who was seen recently by her PCP Dr. Lacinda Axon who has been treating her for a decubitus ulcer of the sacral region, diabetes mellitus type 2, weight loss, essential hypertension. her past history is also significant for breast cancer, stroke and peripheral vascular disease. She had a vascular workup done in May 2017 where to do saw her for her peripheral arterial disease with rest pain in the left lower extremity and did an aortogram and selective left lower extremity angiogram. the findings were that of occlusion of the bypass which terminated in either the posterior tibial  artery which was excluded or a posterior tibial artery with poor runoff. Native SFA was occluded with reconstitution of a diseased popliteal artery with the peroneal artery being best runoff distally it appears the bypass may have been based on the native SFA although it was not entirely clear. last hemoglobin A1c in March of this year was 7.1 Old note: 82 year old female with a past medical history of type 2 diabetes, hypertension, hyperlipidemia, history of breast cancer (right) and current cancer of the left breast, osteoarthritis, and glaucoma presents to the wound clinic today a consult of a wound on her sacral region. This has been there for about 3 months now. Her last hemoglobin A1c was 6.4 and her glucose was 184. She is not bed bound and is ambulating with help and uses a walker and sits on a lift chair for most of the day. Wound History Patient presents with 3 open wounds that have been present for approximately 1 month. Patient has been treating wounds in the following manner: zinc and keeping clean and dry. The wounds have been healed in [place] ([id number]) the past but have re-opened. Laboratory tests have not been performed in the last month. Patient reportedly has not tested positive for an antibiotic resistant organism. Patient reportedly has not had testing performed to evaluate circulation in the legs. Patient History Information obtained from Patient. Allergies No Known Allergies Family History Diabetes - Child, Heart Disease - Mother, Hypertension - Mother, No family history of Cancer, Hereditary Spherocytosis, Kidney Disease, Lung Disease, Seizures, Stroke, Thyroid Problems, Tuberculosis. Social History Never smoker, Marital Status - Widowed, Alcohol Use - Never, Drug Use - No History, Caffeine Use - Never. Medical History Eyes Denies history of Cataracts, Optic Neuritis Ear/Nose/Mouth/Throat Denies history of Chronic sinus problems/congestion, Middle ear  problems Hematologic/Lymphatic Denies history of Anemia, Hemophilia, Human Immunodeficiency Virus, Lymphedema, Sickle Cell Disease Respiratory Denies history of Aspiration, Asthma, Chronic Obstructive Pulmonary Disease (COPD), Pneumothorax, Sleep Apnea, Tuberculosis Cardiovascular Denies history of Angina, Arrhythmia, Congestive Heart Failure, Coronary Artery Disease, Deep Vein Thrombosis, Hypotension, Myocardial Infarction, Peripheral Arterial Disease, Peripheral Venous Disease, Phlebitis, Vasculitis Gastrointestinal Denies history of Cirrhosis , Colitis, Crohn s, Hepatitis A, Hepatitis B, Hepatitis C Endocrine Patient has history of Type II Diabetes Denies history of Type I Diabetes Genitourinary Patient has history of End Stage Renal Disease Immunological Denies history of Lupus Erythematosus, Raynaud s, Scleroderma Integumentary (Skin) Denies history of History of Burn, History of pressure wounds Musculoskeletal Patient has history of Osteoarthritis - legs Denies history of Gout, Rheumatoid Arthritis, Osteomyelitis Neurologic  Denies history of Dementia, Neuropathy, Quadriplegia, Paraplegia, Seizure Disorder Psychiatric [name], [name] ([id number]) Denies history of Anorexia/bulimia, Confinement Anxiety Patient is treated with Insulin. Blood sugar is tested. Blood sugar results noted at the following times: Breakfast - 120, Bedtime - 200. Medical And Surgical History Notes Cardiovascular hyperlipidemia Oncologic hx breast cancer s/p right mastectomy over 10 years ago Review of Systems (ROS) Constitutional Symptoms (General Health) The patient has no complaints or symptoms. Eyes The patient has no complaints or symptoms. Ear/Nose/Mouth/Throat The patient has no complaints or symptoms. Hematologic/Lymphatic The patient has no complaints or symptoms. Respiratory The patient has no complaints or symptoms. Cardiovascular Complains or has symptoms of LE edema - Left stent;  blateral swelling. Gastrointestinal The patient has no complaints or symptoms. Endocrine The patient has no complaints or symptoms. Genitourinary Complains or has symptoms of Incontinence/dribbling - urine. Denies complaints or symptoms of Kidney failure/ Dialysis. Immunological The patient has no complaints or symptoms. Integumentary (Skin) Complains or has symptoms of Wounds. Denies complaints or symptoms of Bleeding or bruising tendency, Breakdown, Swelling. Musculoskeletal The patient has no complaints or symptoms. Neurologic The patient has no complaints or symptoms. Oncologic Breast cancer Psychiatric The patient has no complaints or symptoms. [name], [name] ([id number]) Objective Constitutional Pulse regular. Respirations normal and unlabored. Afebrile. Vitals Time Taken: 8:28 AM, Temperature: 98.1 F, Pulse: 50 bpm, Respiratory Rate: 16 breaths/min, Blood Pressure: 202/80 mmHg. General Notes: patient takes medication for BP. She did not take them this morning. MD notified. Instructed patient to take medication as soon as possible and follow up with PCP. Eyes Nonicteric. Reactive to light. Ears, Nose, Mouth, and Throat Lips, teeth, and gums WNL.Marland Kitchen Moist mucosa without lesions. Neck supple and nontender. No palpable supraclavicular or cervical adenopathy. Normal sized without goiter. Respiratory WNL. No retractions.. Cardiovascular Pedal Pulses WNL. No clubbing, cyanosis or edema. Chest Breasts symmetical and no nipple discharge.. Breast tissue WNL, no masses, lumps, or tenderness.. Gastrointestinal (GI) Abdomen without masses or tenderness.. No liver or spleen enlargement or tenderness.. Lymphatic No adneopathy. No adenopathy. No adenopathy. Musculoskeletal Adexa without tenderness or enlargement.. Digits and nails w/o clubbing, cyanosis, infection, petechiae, ischemia, or inflammatory conditions.Marland Kitchen Psychiatric Judgement and insight Intact.. No evidence of  depression, anxiety, or agitation.. General Notes: she has small contiguous ulcers in the sacral region and the deepest is in the left gluteal area medially and this is a stage III. She has other stage II pressure injuries and the area does not macerated. Integumentary (Hair, Skin) No suspicious lesions. No crepitus or fluctuance. No peri-wound warmth or erythema. No masses.Marland Kitchen [name], [name] ([id number]) Wound #2 status is Open. Original cause of wound was Pressure Injury. The wound is located on the Medial Sacrum. The wound measures 0.6cm length x 0.4cm width x 0.1cm depth; 0.188cm^2 area and 0.019cm^3 volume. The wound is limited to skin breakdown. There is no tunneling or undermining noted. There is a medium amount of serous drainage noted. The wound margin is flat and intact. There is large (67-100%) red, pink granulation within the wound bed. There is no necrotic tissue within the wound bed. The periwound skin appearance exhibited: Scarring, Ecchymosis. The periwound skin appearance did not exhibit: Callus, Crepitus, Excoriation, Fluctuance, Friable, Induration, Localized Edema, Rash, Dry/Scaly, Maceration, Moist, Atrophie Blanche, Cyanosis, Hemosiderin Staining, Mottled, Pallor, Rubor, Erythema. Periwound temperature was noted as No Abnormality. Assessment Active Problems ICD-10 E11.622 - Type 2 diabetes mellitus with other skin ulcer L89.153 - Pressure ulcer of sacral region, stage 3 I73.9 - Peripheral vascular disease, unspecified 82 year old  diabetic who is rather debilitated and spends most of her day in a lift chair or her recliner but has not been offloading as much. After thorough discussion with the patient's daughter who is the caregiver I have recommended; 1. Constant offloading and have asked her to make certain that she does not sit in the sitting position in the lift chair for long periods of time 2. Prisma AG with a bordered foam to be applied and changed on a daily  basis 3. Adequate control of her diabetes mellitus 4. Will order an evaluation for her wheelchair Roho cushion and her bed because nothing has been done for the last year 5. Adequate protein intake including vitamin C vitamin A and zinc 6. Regular visits to the wound center Plan Wound Cleansing: Wound #2 Medial Sacrum: Clean wound with Normal Saline. Primary Wound Dressing: Wound #2 Medial Sacrum: Prisma Ag Secondary Dressing: Coggins, [name] ([id number]) Wound #2 Medial Sacrum: Boardered Foam Dressing Dressing Change Frequency: Wound #2 Medial Sacrum: Change dressing every other day. Follow-up Appointments: Wound #2 Medial Sacrum: Return Appointment in 1 week. Off-Loading: Wound #2 Medial Sacrum: Roho cushion for wheelchair - Evaluation from [employer] Additional Orders / Instructions: Wound #2 Medial Sacrum: Increase protein intake. Devices ordered were: Wheelchair Cushion - [employer] 82 year old diabetic who is rather debilitated and spends most of her day in a lift chair or her recliner but has not been offloading as much. After thorough discussion with the patient's daughter who is the caregiver I have recommended; 1. Constant offloading and have asked her to make certain that she does not sit in the sitting position in the lift chair for long periods of time 2. Prisma AG with a bordered foam to be applied and changed on a daily basis 3. Adequate control of her diabetes mellitus 4. Will order an evaluation for her wheelchair Roho cushion and her bed because nothing has been done for the last year 5. Adequate protein intake including vitamin C vitamin A and zinc 6. Regular visits to the wound center Electronic Signature(s) Signed: 06/01/2016 11:32:46 AM By: Gretta Cool RN, BSN, Kim RN, BSN Signed: 06/01/2016 11:52:56 AM By: Christin Fudge MD, FACS Previous Signature: 05/21/2016 9:57:15 AM Version By: Gretta Cool RN, BSN, Kim RN, BSN Previous Signature: 05/20/2016 9:19:16 AM Version By:  Christin Fudge MD, FACS Entered By: Gretta Cool RN, BSN, Kim on 06/01/2016 11:32:46 [name], [name] ([id number]) -------------------------------------------------------------------------------- ROS/PFSH Details Patient Name: [name] Date of Service: 05/20/2016 8:15 AM Medical Record Number: [id number] Patient Account Number: [id number] Date of Birth/Sex: 1921/08/17 (82 y.o. Female) Treating RN: Cornell Barman Primary Care Physician: [name] Other Clinician: Referring Physician: [name] Treating Physician/Extender: Frann Rider in Treatment: 0 Information Obtained From Patient Wound History Do you currently have one or more open woundso Yes How many open wounds do you currently haveo 3 Approximately how long have you had your woundso 1 month How have you been treating your wound(s) until nowo zinc and keeping clean and dry Has your wound(s) ever healed and then re-openedo Yes Have you had any lab work done in the past montho No Have you tested positive for an antibiotic resistant organism (MRSA, No VRE)o Have you had any tests for circulation on your legso No Constitutional Symptoms (General Health) Complaints and Symptoms: No Complaints or Symptoms Complaints and Symptoms: Negative for: Fatigue; Fever; Chills; Marked Weight Change Respiratory Complaints and Symptoms: No Complaints or Symptoms Complaints and Symptoms: Negative for: Chronic or frequent coughs; Shortness of Breath Medical History: Negative for: Aspiration;  Asthma; Chronic Obstructive Pulmonary Disease (COPD); Pneumothorax; Sleep Apnea; Tuberculosis Cardiovascular Complaints and Symptoms: Positive for: LE edema - Left stent; blateral swelling Medical History: Positive for: Hypertension Negative for: Angina; Arrhythmia; Congestive Heart Failure; Coronary Artery Disease; Deep Vein [name], [name] ([id number]) Thrombosis; Hypotension; Myocardial Infarction; Peripheral Arterial Disease; Peripheral Venous  Disease; Phlebitis; Vasculitis Past Medical History Notes: hyperlipidemia Genitourinary Complaints and Symptoms: Positive for: Incontinence/dribbling - urine Negative for: Kidney failure/ Dialysis Medical History: Positive for: End Stage Renal Disease Integumentary (Skin) Complaints and Symptoms: Positive for: Wounds Negative for: Bleeding or bruising tendency; Breakdown; Swelling Medical History: Negative for: History of Burn; History of pressure wounds Eyes Complaints and Symptoms: No Complaints or Symptoms Medical History: Positive for: Glaucoma Negative for: Cataracts; Optic Neuritis Ear/Nose/Mouth/Throat Complaints and Symptoms: No Complaints or Symptoms Medical History: Negative for: Chronic sinus problems/congestion; Middle ear problems Hematologic/Lymphatic Complaints and Symptoms: No Complaints or Symptoms Medical History: Negative for: Anemia; Hemophilia; Human Immunodeficiency Virus; Lymphedema; Sickle Cell Disease Gastrointestinal Complaints and Symptoms: No Complaints or Symptoms [name], [name] ([id number]) Medical History: Negative for: Cirrhosis ; Colitis; Crohnos; Hepatitis A; Hepatitis B; Hepatitis C Endocrine Complaints and Symptoms: No Complaints or Symptoms Medical History: Positive for: Type II Diabetes Negative for: Type I Diabetes Time with diabetes: many years Treated with: Insulin Blood sugar tested every day: Yes Tested : BID Blood sugar testing results: Breakfast: 120; Bedtime: 200 Immunological Complaints and Symptoms: No Complaints or Symptoms Medical History: Negative for: Lupus Erythematosus; Raynaudos; Scleroderma Musculoskeletal Complaints and Symptoms: No Complaints or Symptoms Medical History: Positive for: Osteoarthritis - legs Negative for: Gout; Rheumatoid Arthritis; Osteomyelitis Neurologic Complaints and Symptoms: No Complaints or Symptoms Medical History: Negative for: Dementia; Neuropathy; Quadriplegia;  Paraplegia; Seizure Disorder Oncologic Complaints and Symptoms: Review of System Notes: Breast cancer Medical History: Negative for: Received Chemotherapy; Received Radiation [name], [name] ([id number]) Past Medical History Notes: hx breast cancer s/p right mastectomy over 10 years ago Psychiatric Complaints and Symptoms: No Complaints or Symptoms Medical History: Negative for: Anorexia/bulimia; Confinement Anxiety HBO Extended History Items Eyes: Glaucoma Immunizations Pneumococcal Vaccine: Received Pneumococcal Vaccination: Yes Family and Social History Cancer: No; Diabetes: Yes - Child; Heart Disease: Yes - Mother; Hereditary Spherocytosis: No; Hypertension: Yes - Mother; Kidney Disease: No; Lung Disease: No; Seizures: No; Stroke: No; Thyroid Problems: No; Tuberculosis: No; Never smoker; Marital Status - Widowed; Alcohol Use: Never; Drug Use: No History; Caffeine Use: Never; Financial Concerns: No; Food, Clothing or Shelter Needs: No; Support System Lacking: No; Transportation Concerns: No; Advanced Directives: Yes (Not Provided); Patient does not want information on Advanced Directives; Living Will: No; Medical Power of Attorney: Yes (Copy provided) Physician Affirmation I have reviewed and agree with the above information. Electronic Signature(s) Signed: 05/20/2016 4:12:42 PM By: Christin Fudge MD, FACS Signed: 05/20/2016 5:13:44 PM By: Gretta Cool RN, BSN, Kim RN, BSN Entered By: Christin Fudge on 05/20/2016 09:14:24 [name], [name] ([id number]) -------------------------------------------------------------------------------- SuperBill Details Patient Name: [name] Date of Service: 05/20/2016 Medical Record Number: [id number] Patient Account Number: [id number] Date of Birth/Sex: 11-03-1921 (82 y.o. Female) Treating RN: Cornell Barman Primary Care Physician: [name] Other Clinician: Referring Physician: [name] Treating Physician/Extender: Frann Rider in Treatment:  0 Diagnosis Coding ICD-10 Codes Code Description E11.622 Type 2 diabetes mellitus with other skin ulcer L89.153 Pressure ulcer of sacral region, stage 3 I73.9 Peripheral vascular disease, unspecified Facility Procedures CPT4 Code: [id number] Description: 99214 - WOUND CARE VISIT-LEV 4 EST PT Modifier: Quantity: 1 Physician Procedures CPT4 Code: [id number] Description: [id number] - WC PHYS LEVEL 4 - EST  PT ICD-10 Description Diagnosis E11.622 Type 2 diabetes mellitus with other skin ulcer L89.153 Pressure ulcer of sacral region, stage 3 I73.9 Peripheral vascular disease, unspecified Modifier: Quantity: 1 Electronic Signature(s) Signed: 05/20/2016 9:19:29 AM By: Christin Fudge MD, FACS Entered By: Christin Fudge on 05/20/2016 [id number]

## 2016-05-27 ENCOUNTER — Ambulatory Visit: Payer: Self-pay | Admitting: Surgery

## 2016-06-03 ENCOUNTER — Encounter: Payer: Medicare PPO | Admitting: Nurse Practitioner

## 2016-06-03 DIAGNOSIS — E11622 Type 2 diabetes mellitus with other skin ulcer: Secondary | ICD-10-CM | POA: Diagnosis not present

## 2016-06-04 NOTE — Progress Notes (Signed)
[name], [name] ([id number]) Visit Report for 06/03/2016 Arrival Information Details Patient Name: [name], [name] Date of Service: 06/03/2016 11:00 AM Medical Record Number: [id number] Patient Account Number: [id number] Date of Birth/Sex: 1921/09/20 (82 y.o. Female) Treating RN: Afful, RN, BSN, Velva Harman Primary Care Physician: [name] Other Clinician: Referring Physician: [name] Treating Physician/Extender: Cathie Olden in Treatment: 2 Visit Information History Since Last Visit All ordered tests and consults were completed: No Patient Arrived: Wheel Chair Added or deleted any medications: No Arrival Time: 11:06 Any new allergies or adverse reactions: No Accompanied By: dtr Had a fall or experienced change in No Transfer Assistance: [employer] activities of daily living that may affect Patient Lift risk of falls: Patient Identification Verified: Yes Signs or symptoms of abuse/neglect since last No Secondary Verification Process Yes visito Completed: Has Dressing in Place as Prescribed: Yes Patient Requires Transmission- No Pain Present Now: No Based Precautions: Patient Has Alerts: Yes Patient Alerts: DM II Electronic Signature(s) Signed: 06/03/2016 5:18:59 PM By: Regan Lemming BSN, RN Entered By: Regan Lemming on 06/03/2016 11:06:38 [name] ([id number]) -------------------------------------------------------------------------------- Encounter Discharge Information Details Patient Name: [name] Date of Service: 06/03/2016 11:00 AM Medical Record Number: [id number] Patient Account Number: [id number] Date of Birth/Sex: 1921/10/29 (82 y.o. Female) Treating RN: Baruch Gouty, RN, BSN, Velva Harman Primary Care Physician: [name] Other Clinician: Referring Physician: [name] Treating Physician/Extender: Cathie Olden in Treatment: 2 Encounter Discharge Information Items Discharge Pain Level: 0 Discharge Condition: Stable Ambulatory Status: Wheelchair Discharge  Destination: Home Transportation: Private Auto Accompanied By: dtr Schedule Follow-up Appointment: No Medication Reconciliation completed and provided to Patient/Care No [place] Luddy: Provided on Clinical Summary of Care: 06/03/2016 Form Type Recipient Paper Patient VD Electronic Signature(s) Signed: 06/03/2016 11:34:13 AM By: Ruthine Dose Entered By: Ruthine Dose on 06/03/2016 11:34:12 [name], [name] ([id number]) -------------------------------------------------------------------------------- Lower Extremity Assessment Details Patient Name: [name] Date of Service: 06/03/2016 11:00 AM Medical Record Number: [id number] Patient Account Number: [id number] Date of Birth/Sex: 1921/12/13 (82 y.o. Female) Treating RN: Afful, RN, BSN, Velva Harman Primary Care Physician: [name] Other Clinician: Referring Physician: [name] Treating Physician/Extender: Cathie Olden in Treatment: 2 Electronic Signature(s) Signed: 06/03/2016 5:18:59 PM By: Regan Lemming BSN, RN Entered By: Regan Lemming on 06/03/2016 11:09:59 [name] ([id number]) -------------------------------------------------------------------------------- Multi Wound Chart Details Patient Name: [name] Date of Service: 06/03/2016 11:00 AM Medical Record Number: [id number] Patient Account Number: [id number] Date of Birth/Sex: June 20, 1921 (82 y.o. Female) Treating RN: Baruch Gouty, RN, BSN, Velva Harman Primary Care Physician: [name] Other Clinician: Referring Physician: [name] Treating Physician/Extender: Cathie Olden in Treatment: 2 Vital Signs Height(in): Pulse(bpm): 78 Weight(lbs): Blood Pressure 166/67 (mmHg): Body Mass Index(BMI): Temperature(F): 98.1 Respiratory Rate 17 (breaths/min): Photos: [2:No Photos] [N/A:N/A] Wound Location: [2:Sacrum - Medial] [N/A:N/A] Wounding Event: [2:Pressure Injury] [N/A:N/A] Primary Etiology: [2:Pressure Ulcer] [N/A:N/A] Comorbid History: [2:Glaucoma, Hypertension,  Type II Diabetes, End Stage Renal Disease, Osteoarthritis] [N/A:N/A] Date Acquired: [2:04/20/2016] [N/A:N/A] Weeks of Treatment: [2:2] [N/A:N/A] Wound Status: [2:Open] [N/A:N/A] Clustered Wound: [2:Yes] [N/A:N/A] Measurements L x W x D 2x1x0.1 [N/A:N/A] (cm) Area (cm) : [2:1.571] [N/A:N/A] Volume (cm) : [2:0.157] [N/A:N/A] % Reduction in Area: [2:-735.60%] [N/A:N/A] % Reduction in Volume: -726.30% [N/A:N/A] Classification: [2:Category/Stage II] [N/A:N/A] Exudate Amount: [2:Medium] [N/A:N/A] Exudate Type: [2:Serous] [N/A:N/A] Exudate Color: [2:amber] [N/A:N/A] Wound Margin: [2:Flat and Intact] [N/A:N/A] Granulation Amount: [2:Large (67-100%)] [N/A:N/A] Granulation Quality: [2:Pink, Friable] [N/A:N/A] Necrotic Amount: [2:None Present (0%)] [N/A:N/A] Exposed Structures: [2:Fascia: No Fat: No Tendon: No Muscle: No] [N/A:N/A] Joint: No Bone: No Limited to Skin Breakdown Epithelialization: Medium (34-66%)  N/A N/A Debridement: Open Wound/Selective N/A N/A [id number]) - Selective Pre-procedure 11:23 N/A N/A Verification/Time Out Taken: Pain Control: Lidocaine 4% Topical N/A N/A Solution Tissue Debrided: Fibrin/Slough N/A N/A Level: Non-Viable Tissue N/A N/A Debridement Area (sq 0.5 N/A N/A cm): Instrument: Curette N/A N/A Bleeding: Minimum N/A N/A Hemostasis Achieved: Pressure N/A N/A Procedural Pain: 0 N/A N/A Post Procedural Pain: 0 N/A N/A Debridement Treatment Procedure was tolerated N/A N/A Response: well Post Debridement 1x0.5x0.1 N/A N/A Measurements L x W x D (cm) Post Debridement 0.039 N/A N/A Volume: (cm) Post Debridement Category/Stage II N/A N/A Stage: Periwound Skin Texture: Scarring: Yes N/A N/A Edema: No Excoriation: No Induration: No Callus: No Crepitus: No Fluctuance: No Friable: No Rash: No Periwound Skin Moist: Yes N/A N/A Moisture: Maceration: No Dry/Scaly: No Periwound Skin Color: Ecchymosis: Yes N/A N/A Atrophie Blanche:  No Cyanosis: No Erythema: No Hemosiderin Staining: No Mottled: No Pallor: No Rubor: No Schwering, [place] ([id number]) Temperature: No Abnormality N/A N/A Tenderness on No N/A N/A Palpation: Wound Preparation: Ulcer Cleansing: N/A N/A Rinsed/Irrigated with Saline Topical Anesthetic Applied: None Procedures Performed: Debridement N/A N/A Treatment Notes Wound #2 (Medial Sacrum) 1. Cleansed with: Clean wound with Normal Saline 4. Dressing Applied: Prisma Ag 5. Secondary Dressing Applied Bordered Foam Dressing Electronic Signature(s) Signed: 06/03/2016 11:52:27 AM By: Rene Kocher, NP, Jana Hakim By: Rene Kocher, NP, Leah on 06/03/2016 11:52:27 [name], [name] ([id number]) -------------------------------------------------------------------------------- Multi-Disciplinary Care Plan Details Patient Name: [name] Date of Service: 06/03/2016 11:00 AM Medical Record Number: [id number] Patient Account Number: [id number] Date of Birth/Sex: 05/17/1922 (82 y.o. Female) Treating RN: Afful, RN, BSN, Velva Harman Primary Care Physician: [name] Other Clinician: Referring Physician: [name] Treating Physician/Extender: Cathie Olden in Treatment: 2 Active Inactive Abuse / Safety / Falls / Self Care Management Nursing Diagnoses: Impaired physical mobility Potential for falls Goals: Patient will remain injury free Date Initiated: 05/20/2016 Goal Status: Active Interventions: Assess: immobility, friction, shearing, incontinence upon admission and as needed Provide education on safe transfers Notes: Nutrition Nursing Diagnoses: Impaired glucose control: actual or potential Goals: Patient/caregiver will maintain therapeutic glucose control Date Initiated: 05/20/2016 Goal Status: Active Interventions: Assess patient nutrition upon admission and as needed per policy Notes: Orientation to the Wound Care Program Nursing Diagnoses: Knowledge deficit related to the wound healing center  program Goals: KHRYSTA, BUNTS ([id number]) Patient/caregiver will verbalize understanding of the [place] Program Date Initiated: 05/20/2016 Goal Status: Active Interventions: Provide education on orientation to the wound center Notes: Pressure Nursing Diagnoses: Potential for impaired tissue integrity related to pressure, friction, moisture, and shear Goals: Patient will remain free from development of additional pressure ulcers Date Initiated: 05/20/2016 Goal Status: Active Patient/caregiver will verbalize understanding of pressure ulcer management Date Initiated: 05/20/2016 Goal Status: Active Interventions: Assess: immobility, friction, shearing, incontinence upon admission and as needed Assess potential for pressure ulcer upon admission and as needed Treatment Activities: Patient referred for home evaluation of offloading devices/mattresses : 05/20/2016 Notes: Wound/Skin Impairment Nursing Diagnoses: Impaired tissue integrity Goals: Ulcer/skin breakdown will heal within 14 weeks Date Initiated: 05/20/2016 Goal Status: Active Interventions: Assess patient/caregiver ability to perform ulcer/skin care regimen upon admission and as needed Notes: Electronic Signature(s) [name], [name] ([id number]) Signed: 06/03/2016 5:18:59 PM By: Regan Lemming BSN, RN Entered By: Regan Lemming on 06/03/2016 11:13:57 [name], [name] ([id number]) -------------------------------------------------------------------------------- Pain Assessment Details Patient Name: [name] Date of Service: 06/03/2016 11:00 AM Medical Record Number: [id number] Patient Account Number: [id number] Date of Birth/Sex: 1922/01/09 (82 y.o. Female) Treating RN: Afful, RN, BSN, [facility]  Physician: [name] Other Clinician: Referring Physician: [name] Treating Physician/Extender: Cathie Olden in Treatment: 2 Active Problems Location of Pain Severity and Description of Pain Patient Has Paino  No Site Locations With Dressing Change: No Pain Management and Medication Current Pain Management: Electronic Signature(s) Signed: 06/03/2016 5:18:59 PM By: Regan Lemming BSN, RN Entered By: Regan Lemming on 06/03/2016 11:06:54 [name] ([id number]) -------------------------------------------------------------------------------- Patient/Caregiver Education Details Patient Name: [name] Date of Service: 06/03/2016 11:00 AM Medical Record Number: [id number] Patient Account Number: [id number] Date of Birth/Gender: 1921-12-23 (82 y.o. Female) Treating RN: Baruch Gouty, RN, BSN, Velva Harman Primary Care Physician: [name] Other Clinician: Referring Physician: [name] Treating Physician/Extender: Cathie Olden in Treatment: 2 Education Assessment Education Provided To: Patient and Caregiver dtr Education Topics Provided Safety: Methods: Explain/Verbal Responses: State content correctly Welcome To The [place]: Methods: Explain/Verbal Responses: State content correctly Wound Debridement: Methods: Explain/Verbal Responses: State content correctly Wound/Skin Impairment: Methods: Explain/Verbal Responses: State content correctly Electronic Signature(s) Signed: 06/03/2016 5:18:59 PM By: Regan Lemming BSN, RN Entered By: Regan Lemming on 06/03/2016 11:30:58 Fussner, [name] ([id number]) -------------------------------------------------------------------------------- Wound Assessment Details Patient Name: [name] Date of Service: 06/03/2016 11:00 AM Medical Record Number: [id number] Patient Account Number: [id number] Date of Birth/Sex: 1921-07-12 (82 y.o. Female) Treating RN: Afful, RN, BSN, [place] Primary Care Physician: [name] Other Clinician: Referring Physician: [name] Treating Physician/Extender: Cathie Olden in Treatment: 2 Wound Status Wound Number: 2 Primary Pressure Ulcer Etiology: Wound Location: Sacrum - Medial Wound Open Wounding Event:  Pressure Injury Status: Date Acquired: 04/20/2016 Comorbid Glaucoma, Hypertension, Type II Weeks Of Treatment: 2 History: Diabetes, End Stage Renal Disease, Clustered Wound: Yes Osteoarthritis Photos Photo Uploaded By: Regan Lemming on 06/03/2016 16:40:31 Wound Measurements Length: (cm) 2 Width: (cm) 1 Depth: (cm) 0.1 Area: (cm) 1.571 Volume: (cm) 0.157 % Reduction in Area: -735.6% % Reduction in Volume: -726.3% Epithelialization: Medium (34-66%) Tunneling: No Undermining: No Wound Description Classification: Category/Stage II Wound Margin: Flat and Intact Exudate Amount: Medium Exudate Type: Serous [name], [name] ([id number]) Foul Odor After Cleansing: No Exudate Color: amber Wound Bed Granulation Amount: Large (67-100%) Exposed Structure Granulation Quality: Pink, Friable Fascia Exposed: No Necrotic Amount: None Present (0%) Fat Layer Exposed: No Tendon Exposed: No Muscle Exposed: No Joint Exposed: No Bone Exposed: No Limited to Skin Breakdown Periwound Skin Texture Texture Color No Abnormalities Noted: No No Abnormalities Noted: No Callus: No Atrophie Blanche: No Crepitus: No Cyanosis: No Excoriation: No Ecchymosis: Yes Fluctuance: No Erythema: No Friable: No Hemosiderin Staining: No Induration: No Mottled: No Localized Edema: No Pallor: No Rash: No Rubor: No Scarring: Yes Temperature / Pain Moisture Temperature: No Abnormality No Abnormalities Noted: No Dry / Scaly: No Maceration: No Moist: Yes Wound Preparation Ulcer Cleansing: Rinsed/Irrigated with Saline Topical Anesthetic Applied: None Treatment Notes Wound #2 (Medial Sacrum) 1. Cleansed with: Clean wound with Normal Saline 4. Dressing Applied: Prisma Ag 5. Secondary Dressing Applied Bordered Foam Dressing Electronic Signature(s) Signed: 06/03/2016 5:18:59 PM By: Regan Lemming BSN, RN Entered By: Regan Lemming on 06/03/2016 11:13:46 [name], [name] ([id number]) JAVONNA, [name]  ([id number]) -------------------------------------------------------------------------------- Vitals Details Patient Name: [name] Date of Service: 06/03/2016 11:00 AM Medical Record Number: [id number] Patient Account Number: [id number] Date of Birth/Sex: [birth year] (82 y.o. Female) Treating RN: Baruch Gouty, RN, BSN, Velva Harman Primary Care Physician: [name] Other Clinician: Referring Physician: [name] Treating Physician/Extender: Cathie Olden in Treatment: 2 Vital Signs Time Taken: 11:06 Temperature (F): 98.1 Pulse (bpm): 78 Respiratory Rate (breaths/min): 17 Blood Pressure (mmHg): 166/67 Reference  Range: 80 - 120 mg / dl Electronic Signature(s) Signed: 06/03/2016 5:18:59 PM By: Regan Lemming BSN, RN Entered By: Regan Lemming on 06/03/2016 11:09:53

## 2016-06-04 NOTE — Progress Notes (Signed)
[name], Yang ([id number]) Visit Report for 06/03/2016 Chief Complaint Document Details Patient Name: [name], [name] 06/03/2016 11:00 Date of Service: AM Medical Record [id number] Number: Patient Account Number: [id number] 15-May-1922 (82 y.o. Treating RN: Baruch Gouty, RN, BSN, Velva Harman Date of Birth/Sex: Female) Other Clinician: Primary Care Physician: [name] Treating Wynne Rozak Referring Physician: [name] Physician/Extender: [name] in Treatment: 2 Information Obtained from: Patient Chief Complaint Patient returns to the wound care center for new ulcer to: the sacral region which she's had for about 2 months now Electronic Signature(s) Signed: 06/03/2016 11:52:57 AM By: Rene Kocher, NP, Jana Hakim By: Rene Kocher, NP, Bethania Schlotzhauer on 06/03/2016 11:52:56 Roussell, [name] ([id number]) -------------------------------------------------------------------------------- Debridement Details Patient Name: [name], Yang 06/03/2016 11:00 Date of Service: AM Medical Record [id number] Number: Patient Account Number: [id number] 09-20-1921 (82 y.o. Treating RN: Baruch Gouty, RN, BSN, Velva Harman Date of Birth/Sex: Female) Other Clinician: Primary Care Physician: [name] Treating Marc Leichter, [place] Referring Physician: [name] Physician/Extender: [name] in Treatment: 2 Debridement Performed for Wound #2 Medial Sacrum Assessment: Performed By: Physician Lawanda Cousins, NP Debridement: Open Wound/Selective Debridement Selective Description: Pre-procedure Yes - 11:23 Verification/Time Out Taken: Start Time: 11:23 Pain Control: Lidocaine 4% Topical Solution Level: Non-Viable Tissue Total Area Debrided (L x 1 (cm) x 0.5 (cm) = 0.5 (cm) W): Tissue and other Non-Viable, Fibrin/Slough material debrided: Instrument: Curette Bleeding: Minimum Hemostasis Achieved: Pressure End Time: 11:24 Procedural Pain: 0 Post Procedural Pain: 0 Response to Treatment: Procedure was tolerated well Post Debridement Measurements of  Total Wound Length: (cm) 1 Stage: Category/Stage III Width: (cm) 0.5 Depth: (cm) 0.1 Volume: (cm) 0.039 Character of Wound/Ulcer Post Stable Debridement: Severity of Tissue Post Fat layer exposed Debridement: Post Procedure Diagnosis Same as Pre-procedure TANEYAH, ABDELAZIZ ([id number]) Electronic Signature(s) Signed: 06/03/2016 11:52:41 AM By: Rene Kocher, NP, Graceyn Fodor Signed: 06/03/2016 5:18:59 PM By: Regan Lemming BSN, RN Entered By: Rene Kocher, NP, Kikue Gerhart on 06/03/2016 11:52:41 Salser, [name] ([id number]) -------------------------------------------------------------------------------- HPI Details Patient Name: [name], Yang 06/03/2016 11:00 Date of Service: AM Medical Record [id number] Number: Patient Account Number: [id number] 1922/02/29 (82 y.o. Treating RN: Baruch Gouty, RN, BSN, Velva Harman Date of Birth/Sex: Female) Other Clinician: Primary Care Physician: [name] Treating Dore Oquin Referring Physician: [name] Physician/Extender: Weeks in Treatment: 2 History of Present Illness Location: sacral region Quality: Patient reports No Pain. Severity: Patient states wound are getting worse. Duration: Patient has had the wound for > 3 months prior to seeking treatment at the wound center Timing: Pain in wound is Intermittent (comes and goes Context: The wound appeared gradually over time Modifying Factors: Other treatment(s) tried include:local ointments Associated Signs and Symptoms: Patient reports having difficulty standing for long periods. HPI Description: 82 year old patient who was seen recently by her PCP Dr. Lacinda Axon who has been treating her for a decubitus ulcer of the sacral region, diabetes mellitus type 2, weight loss, essential hypertension. her past history is also significant for breast cancer, stroke and peripheral vascular disease. She had a vascular workup done in May 2017 where to do saw her for her peripheral arterial disease with rest pain in the left lower extremity and did  an aortogram and selective left lower extremity angiogram. the findings were that of occlusion of the bypass which terminated in either the posterior tibial artery which was excluded or a posterior tibial artery with poor runoff. Native SFA was occluded with reconstitution of a diseased popliteal artery with the peroneal artery being best runoff distally it appears the bypass may have been based on the native SFA although it was not entirely clear.  last hemoglobin A1c in March of this year was 7.1 Old note: 82 year old female with a past medical history of type 2 diabetes, hypertension, hyperlipidemia, history of breast cancer (right) and current cancer of the left breast, osteoarthritis, and glaucoma presents to the wound clinic today a consult of a wound on her sacral region. This has been there for about 3 months now. Her last hemoglobin A1c was 6.4 and her glucose was 184. She is not bed bound and is ambulating with help and uses a walker and sits on a lift chair for most of the day. 06-03-16 Ms. Zeppieri presents today, accompanied by her daughter, for evaluation of her stage III sacral pressure ulcer. Her daughter states there have been no changes in her overall condition: No decrease in appetite, no weight changes, no medication changes, no changes in incontinence, no activity changes. Ms. Babayev states that she has been doing better with her protein intake. Her daughter states that they did receive a call from [facility] last week but no one has shown up for evaluation. We will follow-up with this. Electronic Signature(s) Signed: 06/03/2016 11:56:24 AM By: Rene Kocher, NP, Jana Hakim By: Rene Kocher, NP, [place] Fassnacht on 06/03/2016 11:56:24 DASANI, JARES ([id number]) BETHZY, WOLCOTT ([id number]) -------------------------------------------------------------------------------- Physical Exam Details Patient Name: [name], Yang 06/03/2016 11:00 Date of Service: AM Medical Record [id number] Number: Patient  Account Number: [id number] March 22, 1922 (82 y.o. Treating RN: Baruch Gouty, RN, BSN, Velva Harman Date of Birth/Sex: Female) Other Clinician: Primary Care Physician: [name] Treating Cela Newcom Referring Physician: [name] Physician/Extender: Weeks in Treatment: 2 Constitutional hypertensive. afebrile. denies any weight change. well nourished; well developed. Respiratory non-labored respiratory effort. Musculoskeletal she arrives in a wheelchair, able to transfer to the stretcher. Integumentary (Hair, Skin) no periwound erythema, no tissue necrosis; original ulcer to the left buttocks/sacral region is almost healed at this time, there is a new ulceration mirrored to her previous ulcer on the right buttocks/sacral region this is also a stage III with exposed subcutaneous tissue, and a minimal amount of slough. no induration, no fluctuance, denies pain. Psychiatric calm, pleasant, conversive. Electronic Signature(s) Signed: 06/03/2016 11:58:22 AM By: Rene Kocher, NP, Jana Hakim By: Rene Kocher, NP, Undrea Archbold on 06/03/2016 11:58:21 ORLENE, GURSKY ([id number]) -------------------------------------------------------------------------------- Physician Orders Details Patient Name: LADENE, GILKESON 06/03/2016 11:00 Date of Service: AM Medical Record [id number] Number: Patient Account Number: [id number] 1921/12/02 (82 y.o. Treating RN: Baruch Gouty, RN, BSN, Velva Harman Date of Birth/Sex: Female) Other Clinician: Primary Care Physician: [name] Treating Janis Cuffe Referring Physician: [name] Physician/Extender: [name] in Treatment: 2 Verbal / Phone Orders: Yes Clinician: Afful, RN, BSN, Rita Read Back and Verified: Yes Diagnosis Coding Wound Cleansing Wound #2 Medial Sacrum o Clean wound with Normal Saline. Primary Wound Dressing Wound #2 Medial Sacrum o Prisma Ag Secondary Dressing Wound #2 Medial Sacrum o Boardered Foam Dressing Dressing Change Frequency Wound #2 Medial Sacrum o Change  dressing every other day. Follow-up Appointments Wound #2 Medial Sacrum o Return Appointment in 1 week. Off-Loading Wound #2 Medial Sacrum o Roho cushion for wheelchair - Evaluation from [facility] Additional Orders / Instructions Wound #2 Medial Sacrum o Increase protein intake. Electronic Signature(s) Signed: 06/03/2016 11:58:37 AM By: Rene Kocher, NP, Jana Hakim By: Rene Kocher, NP, Baleria Wyman on 06/03/2016 11:58:37 Alicia, Corbyn ([id number]) VANETTE, OLSSON ([id number]) -------------------------------------------------------------------------------- Problem List Details Patient Name: RANIYAH, AXFORD 06/03/2016 11:00 Date of Service: AM Medical Record [id number] Number: Patient Account Number: [id number] 30-Dec-1921 (82 y.o. Treating RN: Baruch Gouty, RN, BSN, Velva Harman Date of Birth/Sex: Female) Other Clinician: Primary Care Physician: [name] Treating Margareta Laureano, Denny Peon  Referring Physician: [name] Physician/Extender: [name] in Treatment: 2 Active Problems ICD-10 Encounter Code Description Active Date Diagnosis L89.153 Pressure ulcer of sacral region, stage 3 05/20/2016 Yes E11.622 Type 2 diabetes mellitus with other skin ulcer 05/20/2016 Yes I73.9 Peripheral vascular disease, unspecified 05/20/2016 Yes Inactive Problems Resolved Problems Electronic Signature(s) Signed: 06/03/2016 11:52:21 AM By: Rene Kocher, NP, Jana Hakim By: Rene Kocher, NP, Denee Boeder on 06/03/2016 11:52:20 Mudrick, [name] ([id number]) -------------------------------------------------------------------------------- Progress Note Details Patient Name: EYTHEL, HENKEN 06/03/2016 11:00 Date of Service: AM Medical Record [id number] Number: Patient Account Number: [id number] 1922/04/18 (82 y.o. Treating RN: Baruch Gouty, RN, BSN, Velva Harman Date of Birth/Sex: Female) Other Clinician: Primary Care Physician: [name] Treating Aylene Acoff Referring Physician: [name] Physician/Extender: [name] in Treatment: 2 Subjective Chief  Complaint Information obtained from Patient Patient returns to the wound care center for new ulcer to: the sacral region which she's had for about 2 months now History of Present Illness (HPI) The following HPI elements were documented for the patient's wound: Location: sacral region Quality: Patient reports No Pain. Severity: Patient states wound are getting worse. Duration: Patient has had the wound for > 3 months prior to seeking treatment at the wound center Timing: Pain in wound is Intermittent (comes and goes Context: The wound appeared gradually over time Modifying Factors: Other treatment(s) tried include:local ointments Associated Signs and Symptoms: Patient reports having difficulty standing for long periods. 82 year old patient who was seen recently by her PCP Dr. Lacinda Axon who has been treating her for a decubitus ulcer of the sacral region, diabetes mellitus type 2, weight loss, essential hypertension. her past history is also significant for breast cancer, stroke and peripheral vascular disease. She had a vascular workup done in May 2017 where to do saw her for her peripheral arterial disease with rest pain in the left lower extremity and did an aortogram and selective left lower extremity angiogram. the findings were that of occlusion of the bypass which terminated in either the posterior tibial artery which was excluded or a posterior tibial artery with poor runoff. Native SFA was occluded with reconstitution of a diseased popliteal artery with the peroneal artery being best runoff distally it appears the bypass may have been based on the native SFA although it was not entirely clear. last hemoglobin A1c in March of this year was 7.1 Old note: 82 year old female with a past medical history of type 2 diabetes, hypertension, hyperlipidemia, history of breast cancer (right) and current cancer of the left breast, osteoarthritis, and glaucoma presents to the wound clinic today a  consult of a wound on her sacral region. This has been there for about 3 months now. Her last hemoglobin A1c was 6.4 and her glucose was 184. She is not bed bound and is ambulating with help and uses a walker and sits on a lift chair for most of the day. 06-03-16 Ms. Bill presents today, accompanied by her daughter, for evaluation of her stage III sacral pressure ulcer. Her daughter states there have been no changes in her overall condition: No decrease in Buening, Aldeen ([id number]) appetite, no weight changes, no medication changes, no changes in incontinence, no activity changes. Ms. Sartor states that she has been doing better with her protein intake. Her daughter states that they did receive a call from [facility] last week but no one has shown up for evaluation. We will follow-up with this. She does currently have a cushion and a wheelchair. Objective Constitutional hypertensive. afebrile. denies any weight change. well nourished; well developed. Vitals  Time Taken: 11:06 AM, Temperature: 98.1 F, Pulse: 78 bpm, Respiratory Rate: 17 breaths/min, Blood Pressure: 166/67 mmHg. Respiratory non-labored respiratory effort. Musculoskeletal she arrives in a wheelchair, able to transfer to the stretcher. Psychiatric calm, pleasant, conversive. Integumentary (Hair, Skin) no periwound erythema, no tissue necrosis; original ulcer to the left buttocks/sacral region is almost healed at this time, there is a new ulceration mirrored to her previous ulcer on the right buttocks/sacral region this is also a stage III with exposed subcutaneous tissue, and a minimal amount of slough. no induration, no fluctuance, denies pain. Wound #2 status is Open. Original cause of wound was Pressure Injury. The wound is located on the Medial Sacrum. The wound measures 2cm length x 1cm width x 0.1cm depth; 1.571cm^2 area and 0.157cm^3 volume. The wound is limited to skin breakdown. There is no tunneling or undermining  noted. There is a medium amount of serous drainage noted. The wound margin is flat and intact. There is large (67-100%) pink, friable granulation within the wound bed. There is no necrotic tissue within the wound bed. The periwound skin appearance exhibited: Scarring, Moist, Ecchymosis. The periwound skin appearance did not exhibit: Callus, Crepitus, Excoriation, Fluctuance, Friable, Induration, Localized Edema, Rash, Dry/Scaly, Maceration, Atrophie Blanche, Cyanosis, Hemosiderin Staining, Mottled, Pallor, Rubor, Erythema. Periwound temperature was noted as No Abnormality. EZLYNN, COBO ([id number]) Assessment Active Problems ICD-10 L89.153 - Pressure ulcer of sacral region, stage 3 E11.622 - Type 2 diabetes mellitus with other skin ulcer I73.9 - Peripheral vascular disease, unspecified Procedures Wound #2 Wound #2 is a Pressure Ulcer located on the Medial Sacrum . There was a Non-Viable Tissue Open Wound/Selective [phone]) debridement with total area of 0.5 sq cm performed by Lawanda Cousins, NP. with the following instrument(s): Curette to remove Non-Viable tissue/material including Fibrin/Slough after achieving pain control using Lidocaine 4% Topical Solution. A time out was conducted at 11:23, prior to the start of the procedure. A Minimum amount of bleeding was controlled with Pressure. The procedure was tolerated well with a pain level of 0 throughout and a pain level of 0 following the procedure. Post Debridement Measurements: 1cm length x 0.5cm width x 0.1cm depth; 0.039cm^3 volume. Post debridement Stage noted as Category/Stage III. Character of Wound/Ulcer Post Debridement is stable. Severity of Tissue Post Debridement is: Fat layer exposed. Post procedure Diagnosis Wound #2: Same as Pre-Procedure Plan Wound Cleansing: Wound #2 Medial Sacrum: Clean wound with Normal Saline. Primary Wound Dressing: Wound #2 Medial Sacrum: Prisma Ag Secondary Dressing: Wound #2 Medial  Sacrum: Boardered Foam Dressing Dressing Change Frequency: Wound #2 Medial Sacrum: Change dressing every other day. Follow-up Appointments: NEYA, NYHOLM ([id number]) Wound #2 Medial Sacrum: Return Appointment in 1 week. Off-Loading: Wound #2 Medial Sacrum: Roho cushion for wheelchair - Evaluation from [employer] Additional Orders / Instructions: Wound #2 Medial Sacrum: Increase protein intake. Follow-Up Appointments: A Patient Clinical Summary of Care was provided to VD 1. Prisma to ulcerations protected with a foam border 2. Follow-up with [facility] regarding pressure mapping and condition 3. Continue with offloading 4. Follow-up next week Electronic Signature(s) Signed: 06/03/2016 12:00:35 PM By: Rene Kocher, NP, Jana Hakim By: Rene Kocher, NP, Dyan Labarbera on 06/03/2016 12:00:35 [name] ([id number]) -------------------------------------------------------------------------------- SuperBill Details Patient Name: [name] Date of Service: 06/03/2016 Medical Record Patient Account Number: [id number] Number: Afful, RN, BSN, Treating RN: June 08, 1922 (82 y.o. Velva Harman Date of Birth/Sex: Female) Other Clinician: Primary Care Physician: [name] Treating Lekha Dancer Referring Physician: [name] Physician/Extender: [name] in Treatment: 2 Diagnosis Coding ICD-10 Codes Code Description [phone]  Pressure ulcer of sacral region, stage 3 E11.622 Type 2 diabetes mellitus with other skin ulcer I73.9 Peripheral vascular disease, unspecified Facility Procedures CPT4 Code: [id number] Description: [phone] - DEBRIDE WOUND 1ST 20 SQ CM OR < ICD-10 Description Diagnosis L89.153 Pressure ulcer of sacral region, stage 3 Modifier: Quantity: 1 Physician Procedures CPT4 Code: [id number] Description: [id number] - WC PHYS DEBR WO ANESTH 20 SQ CM ICD-10 Description Diagnosis L89.153 Pressure ulcer of sacral region, stage 3 Modifier: Quantity: 1 Electronic Signature(s) Signed: 06/03/2016 12:01:25 PM  By: Rene Kocher, NP, Archit Leger Entered By: Rene Kocher, NP, Ladoris Lythgoe on 06/03/2016 12:01:25

## 2016-06-05 ENCOUNTER — Inpatient Hospital Stay: Admit: 2016-06-05 | Discharge: 2016-06-05 | Payer: MEDICARE | Attending: Emergency Medicine

## 2016-06-05 MED ORDER — SODIUM CHLORIDE 0.9 % IV
Freq: Once | INTRAVENOUS | Status: DC
Start: 2016-06-05 — End: 2016-06-05

## 2016-06-05 NOTE — ED Triage Notes (Signed)
Pt has no complaints at present.  [facility].  Platelet count is 25

## 2016-06-10 ENCOUNTER — Encounter: Payer: Medicare PPO | Admitting: Surgery

## 2016-06-10 DIAGNOSIS — E11622 Type 2 diabetes mellitus with other skin ulcer: Secondary | ICD-10-CM | POA: Diagnosis not present

## 2016-06-10 NOTE — Progress Notes (Addendum)
[name], SIVERLY ([id number]) Visit Report for 06/10/2016 Chief Complaint Document Details Patient Name: [name], [name] 06/10/2016 2:15 Date of Service: PM Medical Record [id number] Number: Patient Account Number: [id number] 12-14-1921 (82 y.o. Treating RN: Baruch Gouty, RN, BSN, [name] Date of Birth/Sex: Female) Other Clinician: Primary Care Physician: [name] Treating Shine Mikes Referring Physician: [name] Physician/Extender: [name] in Treatment: 3 Information Obtained from: Patient Chief Complaint Patient returns to the wound care center for new ulcer to: the sacral region which she's had for about 2 months now Electronic Signature(s) Signed: 06/10/2016 2:28:04 PM By: Christin Fudge MD, FACS Entered By: Christin Fudge on 06/10/2016 14:28:04 [name] ([id number]) -------------------------------------------------------------------------------- HPI Details Patient Name: [name], [name] 06/10/2016 2:15 Date of Service: PM Medical Record [id number] Number: Patient Account Number: [id number] [birth year] (82 y.o. Treating RN: Baruch Gouty, RN, BSN, [name] Date of Birth/Sex: Female) Other Clinician: Primary Care Physician: [name] Treating [name] Referring Physician: [name] Physician/Extender: Weeks in Treatment: 3 History of Present Illness Location: sacral region Quality: Patient reports No Pain. Severity: Patient states wound are getting worse. Duration: Patient has had the wound for > 3 months prior to seeking treatment at the wound center Timing: Pain in wound is Intermittent (comes and goes Context: The wound appeared gradually over time Modifying Factors: Other treatment(s) tried include:local ointments Associated Signs and Symptoms: Patient reports having difficulty standing for long periods. HPI Description: 82 year old patient who was seen recently by her PCP Dr. Lacinda Yang who has been treating her for a decubitus ulcer of the sacral region, diabetes mellitus type 2,  weight loss, essential hypertension. her past history is also significant for breast cancer, stroke and peripheral vascular disease. She had a vascular workup done in May 2017 where to do saw her for her peripheral arterial disease with rest pain in the left lower extremity and did an aortogram and selective left lower extremity angiogram. the findings were that of occlusion of the bypass which terminated in either the posterior tibial artery which was excluded or a posterior tibial artery with poor runoff. Native SFA was occluded with reconstitution of a diseased popliteal artery with the peroneal artery being best runoff distally it appears the bypass may have been based on the native SFA although it was not entirely clear. last hemoglobin A1c in March of this year was 7.1 Old note: 82 year old female with a past medical history of type 2 diabetes, hypertension, hyperlipidemia, history of breast cancer (right) and current cancer of the left breast, osteoarthritis, and glaucoma presents to the wound clinic today a consult of a wound on her sacral region. This has been there for about 3 months now. Her last hemoglobin A1c was 6.4 and her glucose was 184. She is not bed bound and is ambulating with help and uses a walker and sits on a lift chair for most of the day. 06-03-16 Ms. Search presents today, accompanied by her daughter, for evaluation of her stage III sacral pressure ulcer. Her daughter states there have been no changes in her overall condition: No decrease in appetite, no weight changes, no medication changes, no changes in incontinence, no activity changes. Ms. [name] states that she has been doing better with her protein intake. Her daughter states that they did receive a call from [employer] last week but no one has shown up for evaluation. We will follow-up with this. Electronic Signature(s) Signed: 06/10/2016 2:28:09 PM By: Christin Fudge MD, FACS Entered By: Christin Fudge on  06/10/2016 14:28:08 [name], Oleta Yang ([id number]) Yang, [name] ([id number]) --------------------------------------------------------------------------------  Physical Exam Details Patient Name: [name], [name] 06/10/2016 2:15 Date of Service: PM Medical Record [id number] Number: Patient Account Number: [id number] [birth year] (82 y.o. Treating RN: Baruch Gouty, RN, BSN, [name] Date of Birth/Sex: Female) Other Clinician: Primary Care Physician: [name] Treating Jeremaine Maraj Referring Physician: [name] Physician/Extender: Weeks in Treatment: 3 Constitutional . Pulse regular. Respirations normal and unlabored. Afebrile. . Eyes Nonicteric. Reactive to light. Ears, Nose, Mouth, and Throat Lips, teeth, and gums WNL.Marland Kitchen Moist mucosa without lesions. Neck supple and nontender. No palpable supraclavicular or cervical adenopathy. Normal sized without goiter. Respiratory WNL. No retractions.. Breath sounds WNL, No rubs, rales, rhonchi, or wheeze.. Cardiovascular Heart rhythm and rate regular, no murmur or gallop.. Pedal Pulses WNL. No clubbing, cyanosis or edema. Chest Breasts symmetical and no nipple discharge.. Breast tissue WNL, no masses, lumps, or tenderness.. Gastrointestinal (GI) Abdomen without masses or tenderness.. No liver or spleen enlargement or tenderness.. Genitourinary (GU) No hydrocele, spermatocele, tenderness of the cord, or testicular mass.Marland Kitchen Penis without lesions.Lowella Fairy without lesions. No cystocele, or rectocele. Pelvic support intact, no discharge.Marland Kitchen Urethra without masses, tenderness or scarring.Marland Kitchen Lymphatic No adneopathy. No adenopathy. No adenopathy. Musculoskeletal Adexa without tenderness or enlargement.. Digits and nails w/o clubbing, cyanosis, infection, petechiae, ischemia, or inflammatory conditions.. Integumentary (Hair, Skin) No suspicious lesions. No crepitus or fluctuance. No peri-wound warmth or erythema. No masses.Marland Kitchen Psychiatric Judgement and insight  Intact.. No evidence of depression, anxiety, or agitation.. Notes the wound in the sacral region have all healed and she has healthy supple scar which is not macerated. DAISYMAE, BRUMETT ([id number]) Electronic Signature(s) Signed: 06/10/2016 2:29:12 PM By: Christin Fudge MD, FACS Entered By: Christin Fudge on 06/10/2016 14:29:11 KEILI, SFERRA ([id number]) -------------------------------------------------------------------------------- Physician Orders Details Patient Name: [name], [name] 06/10/2016 2:15 Date of Service: PM Medical Record [id number] Number: Patient Account Number: [id number] Nov 27, 1921 (82 y.o. Treating RN: Afful, RN, BSN, [name] Date of Birth/Sex: Female) Other Clinician: Primary Care Physician: [name] Treating Janeah Kovacich Referring Physician: [name] Physician/Extender: [name] in Treatment: 3 Verbal / Phone Orders: Yes Clinician: Afful, RN, BSN, Rita Read Back and Verified: Yes Diagnosis Coding ICD-10 Coding Code Description L89.153 Pressure ulcer of sacral region, stage 3 E11.622 Type 2 diabetes mellitus with other skin ulcer I73.9 Peripheral vascular disease, unspecified Discharge From [facility] Services o Discharge from [place] Completed Electronic Signature(s) Signed: 06/10/2016 4:00:14 PM By: Regan Lemming BSN, RN Signed: 06/10/2016 4:37:15 PM By: Christin Fudge MD, FACS Entered By: Regan Lemming on 06/10/2016 14:30:27 Badie, Oleta Yang ([id number]) -------------------------------------------------------------------------------- Problem List Details Patient Name: [name], [name] 06/10/2016 2:15 Date of Service: PM Medical Record [id number] Number: Patient Account Number: [id number] 02-03-1922 (82 y.o. Treating RN: Baruch Gouty, RN, BSN, [name] Date of Birth/Sex: Female) Other Clinician: Primary Care Physician: [name] Treating Maryhelen Lindler Referring Physician: [name] Physician/Extender: [name] in Treatment: 3 Active  Problems ICD-10 Encounter Code Description Active Date Diagnosis L89.153 Pressure ulcer of sacral region, stage 3 05/20/2016 Yes E11.622 Type 2 diabetes mellitus with other skin ulcer 05/20/2016 Yes I73.9 Peripheral vascular disease, unspecified 05/20/2016 Yes Inactive Problems Resolved Problems Electronic Signature(s) Signed: 06/10/2016 2:27:50 PM By: Christin Fudge MD, FACS Entered By: Christin Fudge on 06/10/2016 14:27:49 Chuba, Oleta Yang ([id number]) -------------------------------------------------------------------------------- Progress Note Details Patient Name: [name], [name] 06/10/2016 2:15 Date of Service: PM Medical Record [id number] Number: Patient Account Number: [id number] Oct 01, 1921 (82 y.o. Treating RN: Baruch Gouty, RN, BSN, [name] Date of Birth/Sex: Female) Other Clinician: Primary Care Physician: [name] Treating Christin Fudge Referring Physician: [name] Physician/Extender: [name] in Treatment: 3 Subjective  Chief Complaint Information obtained from Patient Patient returns to the wound care center for new ulcer to: the sacral region which she's had for about 2 months now History of Present Illness (HPI) The following HPI elements were documented for the patient's wound: Location: sacral region Quality: Patient reports No Pain. Severity: Patient states wound are getting worse. Duration: Patient has had the wound for > 3 months prior to seeking treatment at the wound center Timing: Pain in wound is Intermittent (comes and goes Context: The wound appeared gradually over time Modifying Factors: Other treatment(s) tried include:local ointments Associated Signs and Symptoms: Patient reports having difficulty standing for long periods. 82 year old patient who was seen recently by her PCP Dr. Lacinda Yang who has been treating her for a decubitus ulcer of the sacral region, diabetes mellitus type 2, weight loss, essential hypertension. her past history is also significant for breast  cancer, stroke and peripheral vascular disease. She had a vascular workup done in May 2017 where to do saw her for her peripheral arterial disease with rest pain in the left lower extremity and did an aortogram and selective left lower extremity angiogram. the findings were that of occlusion of the bypass which terminated in either the posterior tibial artery which was excluded or a posterior tibial artery with poor runoff. Native SFA was occluded with reconstitution of a diseased popliteal artery with the peroneal artery being best runoff distally it appears the bypass may have been based on the native SFA although it was not entirely clear. last hemoglobin A1c in March of this year was 7.1 Old note: 82 year old female with a past medical history of type 2 diabetes, hypertension, hyperlipidemia, history of breast cancer (right) and current cancer of the left breast, osteoarthritis, and glaucoma presents to the wound clinic today a consult of a wound on her sacral region. This has been there for about 3 months now. Her last hemoglobin A1c was 6.4 and her glucose was 184. She is not bed bound and is ambulating with help and uses a walker and sits on a lift chair for most of the day. 06-03-16 Ms. Freshley presents today, accompanied by her daughter, for evaluation of her stage III sacral pressure ulcer. Her daughter states there have been no changes in her overall condition: No decrease in Yu, Unnamed ([id number]) appetite, no weight changes, no medication changes, no changes in incontinence, no activity changes. Ms. Carrigan states that she has been doing better with her protein intake. Her daughter states that they did receive a call from [employer] last week but no one has shown up for evaluation. We will follow-up with this. Objective Constitutional Pulse regular. Respirations normal and unlabored. Afebrile. Vitals Time Taken: 2:19 PM, Temperature: 97.8 F, Pulse: 90 bpm, Respiratory Rate: 16  breaths/min, Blood Pressure: 207/83 mmHg. Eyes Nonicteric. Reactive to light. Ears, Nose, Mouth, and Throat Lips, teeth, and gums WNL.Marland Kitchen Moist mucosa without lesions. Neck supple and nontender. No palpable supraclavicular or cervical adenopathy. Normal sized without goiter. Respiratory WNL. No retractions.. Breath sounds WNL, No rubs, rales, rhonchi, or wheeze.. Cardiovascular Heart rhythm and rate regular, no murmur or gallop.. Pedal Pulses WNL. No clubbing, cyanosis or edema. Chest Breasts symmetical and no nipple discharge.. Breast tissue WNL, no masses, lumps, or tenderness.. Gastrointestinal (GI) Abdomen without masses or tenderness.. No liver or spleen enlargement or tenderness.. Genitourinary (GU) No hydrocele, spermatocele, tenderness of the cord, or testicular mass.Marland Kitchen Penis without lesions.Lowella Fairy without lesions. No cystocele, or rectocele. Pelvic support intact, no discharge.Marland Kitchen  Urethra without masses, tenderness or scarring.Marland Kitchen Lymphatic No adneopathy. No adenopathy. No adenopathy. Musculoskeletal Adexa without tenderness or enlargement.. Digits and nails w/o clubbing, cyanosis, infection, petechiae, ischemia, or inflammatory conditions.Marland Kitchen MCCOY, CHAO ([id number]) Psychiatric Judgement and insight Intact.. No evidence of depression, anxiety, or agitation.. General Notes: the wound in the sacral region have all healed and she has healthy supple scar which is not macerated. Integumentary (Hair, Skin) No suspicious lesions. No crepitus or fluctuance. No peri-wound warmth or erythema. No masses.. Wound #2 status is Healed - Epithelialized. Original cause of wound was Pressure Injury. The wound is located on the Medial Sacrum. The wound measures 0cm length x 0cm width x 0cm depth; 0cm^2 area and 0cm^3 volume. Assessment Active Problems ICD-10 L89.153 - Pressure ulcer of sacral region, stage 3 E11.622 - Type 2 diabetes mellitus with other skin ulcer I73.9 - Peripheral  vascular disease, unspecified Plan Discharge From [facility] Services: Discharge from [place] Completed Now that the wounds have all healed I have reiterated the importance of off loading, good nutrition and frequent sessions change through the day. She and her daughter said they'll be compliant. She is discharged on the wound care services and will be seen back as needed Electronic Signature(s) NIEASHA, MCAULAY ([id number]) Signed: 06/11/2016 4:37:16 PM By: Christin Fudge MD, FACS Previous Signature: 06/10/2016 2:30:26 PM Version By: Christin Fudge MD, FACS Entered By: Christin Fudge on 06/11/2016 16:37:15 Dedic, Naz ([id number]) -------------------------------------------------------------------------------- SuperBill Details Patient Name: [name] Date of Service: 06/10/2016 Medical Record Patient Account Number: [id number] Number: Afful, RN, BSN, Treating RN: 1922-05-16 (82 y.o. [name] Date of Birth/Sex: Female) Other Clinician: Primary Care Physician: [name] Treating Christin Fudge Referring Physician: [name] Physician/Extender: [name] in Treatment: 3 Diagnosis Coding ICD-10 Codes Code Description L89.153 Pressure ulcer of sacral region, stage 3 E11.622 Type 2 diabetes mellitus with other skin ulcer I73.9 Peripheral vascular disease, unspecified Facility Procedures CPT4 Code: [id number] Description: [phone] - WOUND CARE VISIT-LEV 2 EST PT Modifier: Quantity: 1 Physician Procedures CPT4 Code: [id number] Description: [id number] - WC PHYS LEVEL 2 - EST PT ICD-10 Description Diagnosis L89.153 Pressure ulcer of sacral region, stage 3 E11.622 Type 2 diabetes mellitus with other skin ulcer I73.9 Peripheral vascular disease, unspecified Modifier: Quantity: 1 Electronic Signature(s) Signed: 06/10/2016 4:00:14 PM By: Regan Lemming BSN, RN Signed: 06/10/2016 4:37:15 PM By: Christin Fudge MD, FACS Previous Signature: 06/10/2016 2:30:42 PM Version By: Christin Fudge  MD, FACS Entered By: Regan Lemming on 06/10/2016 14:31:07

## 2016-06-11 NOTE — Progress Notes (Signed)
LENYX, CARDINAS ([id number]) Visit Report for 06/10/2016 Arrival Information Details Patient Name: SARAFINA, MIKES Date of Service: 06/10/2016 2:15 PM Medical Record Number: [id number] Patient Account Number: [id number] Date of Birth/Sex: [birth year] (82 y.o. Female) Treating RN: Afful, RN, BSN, Velva Harman Primary Care Physician: [name] Other Clinician: Referring Physician: [name] Treating Physician/Extender: Frann Rider in Treatment: 3 Visit Information History Since Last Visit All ordered tests and consults were completed: No Patient Arrived: Wheel Chair Added or deleted any medications: No Arrival Time: 14:19 Any new allergies or adverse reactions: No Accompanied By: dtr Had a fall or experienced change in No Transfer Assistance: EasyPivot activities of daily living that may affect Patient Lift risk of falls: Patient Identification Verified: Yes Signs or symptoms of abuse/neglect since last No Secondary Verification Process Yes visito Completed: Hospitalized since last visit: No Patient Requires Transmission- No Has Dressing in Place as Prescribed: Yes Based Precautions: Pain Present Now: No Patient Has Alerts: Yes Patient Alerts: DM II Electronic Signature(s) Signed: 06/10/2016 4:00:14 PM By: Regan Lemming BSN, RN Entered By: Regan Lemming on 06/10/2016 14:19:21 Smedley, [name] ([id number]) -------------------------------------------------------------------------------- Clinic Level of Care Assessment Details Patient Name: [name] Date of Service: 06/10/2016 2:15 PM Medical Record Number: [id number] Patient Account Number: [id number] Date of Birth/Sex: 1921/12/23 (82 y.o. Female) Treating RN: Afful, RN, BSN, Velva Harman Primary Care Physician: [name] Other Clinician: Referring Physician: [name] Treating Physician/Extender: Frann Rider in Treatment: 3 Clinic Level of Care Assessment Items TOOL 4 Quantity Score []  - Use when only an EandM is performed  on FOLLOW-UP visit 0 ASSESSMENTS - Nursing Assessment / Reassessment X - Reassessment of Co-morbidities (includes updates in patient status) 1 10 X - Reassessment of Adherence to Treatment Plan 1 5 ASSESSMENTS - Wound and Skin Assessment / Reassessment X - Simple Wound Assessment / Reassessment - one wound 1 5 []  - Complex Wound Assessment / Reassessment - multiple wounds 0 []  - Dermatologic / Skin Assessment (not related to wound area) 0 ASSESSMENTS - Focused Assessment []  - Circumferential Edema Measurements - multi extremities 0 []  - Nutritional Assessment / Counseling / Intervention 0 []  - Lower Extremity Assessment (monofilament, tuning fork, pulses) 0 []  - Peripheral Arterial Disease Assessment (using hand held doppler) 0 ASSESSMENTS - Ostomy and/or Continence Assessment and Care []  - Incontinence Assessment and Management 0 []  - Ostomy Care Assessment and Management (repouching, etc.) 0 PROCESS - Coordination of Care X - Simple Patient / Family Education for ongoing care 1 15 []  - Complex (extensive) Patient / Family Education for ongoing care 0 []  - Staff obtains Programmer, systems, Records, Test Results / Process Orders 0 []  - Staff telephones HHA, Nursing Homes / Clarify orders / etc 0 []  - Routine Transfer to another Facility (non-emergent condition) 0 Giannetti, Jennavieve ([id number]) []  - Routine Hospital Admission (non-emergent condition) 0 []  - New Admissions / Biomedical engineer / Ordering NPWT, Apligraf, etc. 0 []  - Emergency Hospital Admission (emergent condition) 0 []  - Simple Discharge Coordination 0 []  - Complex (extensive) Discharge Coordination 0 PROCESS - Special Needs []  - Pediatric / Minor Patient Management 0 []  - Isolation Patient Management 0 []  - Hearing / Language / Visual special needs 0 []  - Assessment of Community assistance (transportation, D/C planning, etc.) 0 []  - Additional assistance / Altered mentation 0 []  - Support Surface(s) Assessment (bed, cushion,  seat, etc.) 0 INTERVENTIONS - Wound Cleansing / Measurement []  - Simple Wound Cleansing - one wound 0 []  - Complex Wound Cleansing - multiple wounds 0  X - Wound Imaging (photographs - any number of wounds) 1 5 []  - Wound Tracing (instead of photographs) 0 []  - Simple Wound Measurement - one wound 0 []  - Complex Wound Measurement - multiple wounds 0 INTERVENTIONS - Wound Dressings []  - Small Wound Dressing one or multiple wounds 0 []  - Medium Wound Dressing one or multiple wounds 0 []  - Large Wound Dressing one or multiple wounds 0 []  - Application of Medications - topical 0 []  - Application of Medications - injection 0 INTERVENTIONS - Miscellaneous []  - External ear exam 0 Zollars, Kysa ([id number]) []  - Specimen Collection (cultures, biopsies, blood, body fluids, etc.) 0 []  - Specimen(s) / Culture(s) sent or taken to Lab for analysis 0 []  - Patient Transfer (multiple staff / Harrel Lemon Lift / Similar devices) 0 []  - Simple Staple / Suture removal (25 or less) 0 []  - Complex Staple / Suture removal (26 or more) 0 []  - Hypo / Hyperglycemic Management (close monitor of Blood Glucose) 0 []  - Ankle / Brachial Index (ABI) - do not check if billed separately 0 X - Vital Signs 1 5 Has the patient been seen at the hospital within the last three years: Yes Total Score: 45 Level Of Care: New/Established - Level 2 Electronic Signature(s) Signed: 06/10/2016 4:00:14 PM By: Regan Lemming BSN, RN Entered By: Regan Lemming on 06/10/2016 14:30:56 Boodram, [name] ([id number]) -------------------------------------------------------------------------------- Encounter Discharge Information Details Patient Name: [name] Date of Service: 06/10/2016 2:15 PM Medical Record Number: [id number] Patient Account Number: [id number] Date of Birth/Sex: 10/31/1921 (82 y.o. Female) Treating RN: Baruch Gouty, RN, BSN, Velva Harman Primary Care Physician: [name] Other Clinician: Referring Physician: [name] Treating  Physician/Extender: Frann Rider in Treatment: 3 Encounter Discharge Information Items Discharge Pain Level: 0 Discharge Condition: Stable Ambulatory Status: Wheelchair Discharge Destination: Home Transportation: Private Auto Accompanied By: dtr Schedule Follow-up Appointment: No Medication Reconciliation completed and provided to Patient/Care No Shyquan Stallbaumer: Provided on Clinical Summary of Care: 06/10/2016 Form Type Recipient Paper Patient VD Electronic Signature(s) Signed: 06/10/2016 2:33:12 PM By: Ruthine Dose Entered By: Ruthine Dose on 06/10/2016 14:33:12 Zirbes, Ki ([id number]) -------------------------------------------------------------------------------- Lower Extremity Assessment Details Patient Name: [name] Date of Service: 06/10/2016 2:15 PM Medical Record Number: [id number] Patient Account Number: [id number] Date of Birth/Sex: 01/21/1922 (82 y.o. Female) Treating RN: Baruch Gouty, RN, BSN, Velva Harman Primary Care Physician: [name] Other Clinician: Referring Physician: [name] Treating Physician/Extender: Frann Rider in Treatment: 3 Electronic Signature(s) Signed: 06/10/2016 4:00:14 PM By: Regan Lemming BSN, RN Entered By: Regan Lemming on 06/10/2016 14:19:51 Schnitzer, [name] ([id number]) -------------------------------------------------------------------------------- Multi Wound Chart Details Patient Name: [name] Date of Service: 06/10/2016 2:15 PM Medical Record Number: [id number] Patient Account Number: [id number] Date of Birth/Sex: 1922-05-01 (82 y.o. Female) Treating RN: Baruch Gouty, RN, BSN, Velva Harman Primary Care Physician: [name] Other Clinician: Referring Physician: [name] Treating Physician/Extender: Frann Rider in Treatment: 3 Vital Signs Height(in): Pulse(bpm): 90 Weight(lbs): Blood Pressure 207/83 (mmHg): Body Mass Index(BMI): Temperature(F): 97.8 Respiratory Rate 16 (breaths/min): Photos: [2:No Photos]  [N/A:N/A] Wound Location: [2:Sacrum - Medial] [N/A:N/A] Wounding Event: [2:Pressure Injury] [N/A:N/A] Primary Etiology: [2:Pressure Ulcer] [N/A:N/A] Comorbid History: [2:Glaucoma, Hypertension, Type II Diabetes, End Stage Renal Disease, Osteoarthritis] [N/A:N/A] Date Acquired: [2:04/20/2016] [N/A:N/A] Weeks of Treatment: [2:3] [N/A:N/A] Wound Status: [2:Healed - Epithelialized] [N/A:N/A] Clustered Wound: [2:Yes] [N/A:N/A] Measurements L x W x D 0x0x0 [N/A:N/A] (cm) Area (cm) : [2:0] [N/A:N/A] Volume (cm) : [2:0] [N/A:N/A] % Reduction in Area: [2:100.00%] [N/A:N/A] % Reduction in Volume: 100.00% [N/A:N/A] Classification: [2:Category/Stage II] [N/A:N/A] Exudate  Amount: [2:None Present] [N/A:N/A] Wound Margin: [2:Distinct, outline attached] [N/A:N/A] Granulation Amount: [2:None Present (0%)] [N/A:N/A] Necrotic Amount: [2:None Present (0%)] [N/A:N/A] Exposed Structures: [2:Fascia: No Fat: No Tendon: No Muscle: No Joint: No Bone: No] [N/A:N/A] Limited to Skin Breakdown Epithelialization: Large (67-100%) N/A N/A Periwound Skin Texture: Edema: No N/A N/A Excoriation: No Induration: No Callus: No Crepitus: No Fluctuance: No Friable: No Rash: No Scarring: No Periwound Skin Dry/Scaly: Yes N/A N/A Moisture: Maceration: No Moist: No Periwound Skin Color: Atrophie Blanche: No N/A N/A Cyanosis: No Ecchymosis: No Erythema: No Hemosiderin Staining: No Mottled: No Pallor: No Rubor: No Tenderness on No N/A N/A Palpation: Wound Preparation: Ulcer Cleansing: N/A N/A Rinsed/Irrigated with Saline Topical Anesthetic Applied: None Treatment Notes Electronic Signature(s) Signed: 06/10/2016 4:00:14 PM By: Regan Lemming BSN, RN Previous Signature: 06/10/2016 2:27:58 PM Version By: Christin Fudge MD, FACS Entered By: Regan Lemming on 06/10/2016 14:30:06 [name] ([id number]) -------------------------------------------------------------------------------- Dexter  Details Patient Name: [name] Date of Service: 06/10/2016 2:15 PM Medical Record Number: [id number] Patient Account Number: [id number] Date of Birth/Sex: 31-Oct-1921 (82 y.o. Female) Treating RN: Baruch Gouty, RN, BSN, Velva Harman Primary Care Physician: [name] Other Clinician: Referring Physician: [name] Treating Physician/Extender: Frann Rider in Treatment: 3 Active Inactive Electronic Signature(s) Signed: 06/10/2016 4:00:14 PM By: Regan Lemming BSN, RN Entered By: Regan Lemming on 06/10/2016 14:29:46 Brigham, [name] ([id number]) -------------------------------------------------------------------------------- Pain Assessment Details Patient Name: [name] Date of Service: 06/10/2016 2:15 PM Medical Record Number: [id number] Patient Account Number: [id number] Date of Birth/Sex: 06/12/1922 (82 y.o. Female) Treating RN: Baruch Gouty, RN, BSN, Velva Harman Primary Care Physician: [name] Other Clinician: Referring Physician: [name] Treating Physician/Extender: Frann Rider in Treatment: 3 Active Problems Location of Pain Severity and Description of Pain Patient Has Paino No Site Locations With Dressing Change: No Pain Management and Medication Current Pain Management: Electronic Signature(s) Signed: 06/10/2016 4:00:14 PM By: Regan Lemming BSN, RN Entered By: Regan Lemming on 06/10/2016 14:19:27 [name] ([id number]) -------------------------------------------------------------------------------- Patient/Caregiver Education Details Patient Name: [name] Date of Service: 06/10/2016 2:15 PM Medical Record Number: [id number] Patient Account Number: [id number] Date of Birth/Gender: January 01, 1922 (82 y.o. Female) Treating RN: Baruch Gouty, RN, BSN, Velva Harman Primary Care Physician: [name] Other Clinician: Referring Physician: [name] Treating Physician/Extender: Frann Rider in Treatment: 3 Education Assessment Education Provided To: Patient and Caregiver Education  Topics Provided Basic Hygiene: Methods: Explain/Verbal Responses: State content correctly Electronic Signature(s) Signed: 06/10/2016 4:00:14 PM By: Regan Lemming BSN, RN Entered By: Regan Lemming on 06/10/2016 14:31:47 Crooke, [name] ([id number]) -------------------------------------------------------------------------------- Wound Assessment Details Patient Name: [name] Date of Service: 06/10/2016 2:15 PM Medical Record Number: [id number] Patient Account Number: [id number] Date of Birth/Sex: 01/15/1922 (82 y.o. Female) Treating RN: Afful, RN, BSN, Velva Harman Primary Care Physician: [name] Other Clinician: Referring Physician: [name] Treating Physician/Extender: Frann Rider in Treatment: 3 Wound Status Wound Number: 2 Primary Pressure Ulcer Etiology: Wound Location: Sacrum - Medial Wound Healed - Epithelialized Wounding Event: Pressure Injury Status: Date Acquired: 04/20/2016 Comorbid Glaucoma, Hypertension, Type II Weeks Of Treatment: 3 History: Diabetes, End Stage Renal Disease, Clustered Wound: Yes Osteoarthritis Photos Photo Uploaded By: Regan Lemming on 06/10/2016 15:56:04 Wound Measurements Length: (cm) 0 % Reductio Width: (cm) 0 % Reductio Depth: (cm) 0 Epithelial Area: (cm) 0 Tunneling Volume: (cm) 0 Undermini n in Area: 100% n in Volume: 100% ization: Large (67-100%) : No ng: No Wound Description Classification: Category/Stage II Wound Margin: Distinct, outline attached Exudate Amount: None Present Foul Odor After Cleansing: No Wound Bed  Granulation Amount: None Present (0%) Exposed Structure Necrotic Amount: None Present (0%) Fascia Exposed: No Fat Layer Exposed: No Tendon Exposed: No Muscle Exposed: No Joint Exposed: No Shugart, Kayslee ([id number]) Bone Exposed: No Limited to Skin Breakdown Periwound Skin Texture Texture Color No Abnormalities Noted: No No Abnormalities Noted: No Callus: No Atrophie Blanche: No Crepitus:  No Cyanosis: No Excoriation: No Ecchymosis: No Fluctuance: No Erythema: No Friable: No Hemosiderin Staining: No Induration: No Mottled: No Localized Edema: No Pallor: No Rash: No Rubor: No Scarring: No Moisture No Abnormalities Noted: No Dry / Scaly: Yes Maceration: No Moist: No Wound Preparation Ulcer Cleansing: Rinsed/Irrigated with Saline Topical Anesthetic Applied: None Electronic Signature(s) Signed: 06/10/2016 4:00:14 PM By: Regan Lemming BSN, RN Entered By: Regan Lemming on 06/10/2016 14:29:25 [name] ([id number]) -------------------------------------------------------------------------------- Vitals Details Patient Name: [name] Date of Service: 06/10/2016 2:15 PM Medical Record Number: [id number] Patient Account Number: [id number] Date of Birth/Sex: 1922/04/21 (82 y.o. Female) Treating RN: Afful, RN, BSN, Velva Harman Primary Care Physician: [name] Other Clinician: Referring Physician: [name] Treating Physician/Extender: Frann Rider in Treatment: 3 Vital Signs Time Taken: 14:19 Temperature (F): 97.8 Pulse (bpm): 90 Respiratory Rate (breaths/min): 16 Blood Pressure (mmHg): 207/83 Reference Range: 80 - 120 mg / dl Electronic Signature(s) Signed: 06/10/2016 4:00:14 PM By: Regan Lemming BSN, RN Entered By: Regan Lemming on 06/10/2016 14:20:59

## 2016-06-18 ENCOUNTER — Encounter: Payer: Self-pay | Admitting: Family Medicine

## 2016-06-20 ENCOUNTER — Other Ambulatory Visit: Payer: Self-pay | Admitting: Family Medicine

## 2016-06-20 DIAGNOSIS — L89152 Pressure ulcer of sacral region, stage 2: Secondary | ICD-10-CM

## 2016-06-21 ENCOUNTER — Other Ambulatory Visit: Payer: Self-pay | Admitting: Family Medicine

## 2016-06-22 ENCOUNTER — Telehealth: Payer: Self-pay | Admitting: Family Medicine

## 2016-06-22 ENCOUNTER — Other Ambulatory Visit: Payer: Self-pay

## 2016-06-22 NOTE — Telephone Encounter (Signed)
Denyse Amass [id number] called from [facility] regarding pt had a start care of visit. Questions about medications and therapy?  Please do not leave a vm. He needs to speak to someone. Thank you!

## 2016-06-22 NOTE — Telephone Encounter (Signed)
[name] was called and went over medication that pt was taking. everything was correct. He stated that pt wanted to have a home health nurse come in. These orders will be faxed over for signatures.

## 2016-06-22 NOTE — Telephone Encounter (Signed)
Refilled 02/03/16.  Pt last seen 02/03/16. Please advise?

## 2016-06-25 ENCOUNTER — Other Ambulatory Visit: Payer: Self-pay | Admitting: Family Medicine

## 2016-06-25 ENCOUNTER — Telehealth: Payer: Self-pay | Admitting: Family Medicine

## 2016-06-25 DIAGNOSIS — R300 Dysuria: Secondary | ICD-10-CM

## 2016-06-25 NOTE — Telephone Encounter (Signed)
Called courtlanda she was not able to take UA or Culture. She was not sure what to do in regards to that. Pts daughter was called and asked if pt could come to our office to have a UA done she said that her husband was not home to help her transport pt. Daughter was asked if she could take pt to the hospital lab tomorrow 06/26/16 to have it done since pt has had symptoms for 1 weeks.  The daughter stated she will take pt when husband comes home. need orders placed for UA and culture please place for future.

## 2016-06-25 NOTE — Telephone Encounter (Signed)
Orders placed.

## 2016-06-25 NOTE — Telephone Encounter (Signed)
Courtlanda from [facility] called and stated that pt is c/o burning when urinating, and strong order. They would like to know if you call put in an order for a UA or call something in. Please advise, thank you!  Call [place] - [phone]

## 2016-06-25 NOTE — Telephone Encounter (Signed)
Okay with UA and culture. Can give verbal.

## 2016-06-25 NOTE — Telephone Encounter (Signed)
[facility]TC[facility]

## 2016-06-26 ENCOUNTER — Other Ambulatory Visit
Admission: RE | Admit: 2016-06-26 | Discharge: 2016-06-26 | Disposition: A | Discharge status: Home / Self Care | Payer: Medicare PPO | Source: Other Acute Inpatient Hospital | Attending: Family Medicine | Admitting: Family Medicine

## 2016-06-26 DIAGNOSIS — R300 Dysuria: Secondary | ICD-10-CM | POA: Insufficient documentation

## 2016-06-26 LAB — URINALYSIS, COMPLETE (UACMP) WITH MICROSCOPIC
BILIRUBIN URINE: NEGATIVE
Glucose, UA: 50 mg/dL — AB
Hgb urine dipstick: NEGATIVE
KETONES UR: NEGATIVE mg/dL
Leukocytes, UA: NEGATIVE
Nitrite: NEGATIVE
PH: 6 (ref 5.0–8.0)
Protein, ur: NEGATIVE mg/dL
SPECIFIC GRAVITY, URINE: 1.019 (ref 1.005–1.030)

## 2016-06-28 ENCOUNTER — Other Ambulatory Visit (INDEPENDENT_AMBULATORY_CARE_PROVIDER_SITE_OTHER): Payer: Self-pay | Admitting: Vascular Surgery

## 2016-06-28 DIAGNOSIS — I739 Peripheral vascular disease, unspecified: Secondary | ICD-10-CM

## 2016-06-28 LAB — URINE CULTURE

## 2016-06-29 ENCOUNTER — Encounter (INDEPENDENT_AMBULATORY_CARE_PROVIDER_SITE_OTHER): Payer: Self-pay | Admitting: Vascular Surgery

## 2016-06-29 ENCOUNTER — Ambulatory Visit (INDEPENDENT_AMBULATORY_CARE_PROVIDER_SITE_OTHER): Payer: Medicare PPO | Admitting: Vascular Surgery

## 2016-06-29 ENCOUNTER — Ambulatory Visit (INDEPENDENT_AMBULATORY_CARE_PROVIDER_SITE_OTHER): Payer: Medicare PPO

## 2016-06-29 VITALS — BP 176/78 | HR 100 | Resp 16

## 2016-06-29 DIAGNOSIS — I739 Peripheral vascular disease, unspecified: Secondary | ICD-10-CM | POA: Diagnosis not present

## 2016-06-29 DIAGNOSIS — E785 Hyperlipidemia, unspecified: Secondary | ICD-10-CM | POA: Diagnosis not present

## 2016-06-29 DIAGNOSIS — E1149 Type 2 diabetes mellitus with other diabetic neurological complication: Secondary | ICD-10-CM

## 2016-06-29 DIAGNOSIS — I10 Essential (primary) hypertension: Secondary | ICD-10-CM

## 2016-06-29 NOTE — Assessment & Plan Note (Signed)
Given her immobility following her stroke, she does not really have any claudication symptoms. She is not currently having any ischemic rest pain. Her ABIs today are noncompressible with normal waveforms and digital pressures on the right. Her left ABIs actually significantly improved up to 0.55 with a digital pressure of 61. She has no limb threatening symptoms. We will see her back in 6 months with noninvasive studies.

## 2016-06-29 NOTE — Assessment & Plan Note (Signed)
blood pressure control important in reducing the progression of atherosclerotic disease. On appropriate oral medications.  

## 2016-06-29 NOTE — Progress Notes (Signed)
MRN : [id number]  [name] is a 83 y.o. ([birth year]) female who presents with chief complaint of  Chief Complaint  Patient presents with  . Follow-up  .  History of Present Illness: Patient returns today in follow up of Peripheral arterial disease. She has a remote history of intervention a couple of years ago. She currently has no limb threatening symptoms of ischemic rest pain, ulceration, or infection. She is in her usual state of health and is wheelchair bound after stroke. Given her immobility following her stroke, she does not really have any claudication symptoms. She is not currently having any ischemic rest pain. Her ABIs today are noncompressible with normal waveforms and digital pressures on the right. Her left ABIs actually significantly improved up to 0.55 with a digital pressure of 61.  Current Outpatient Prescriptions  Medication Sig Dispense Refill  . Alcohol Swabs (B-D SINGLE USE SWABS REGULAR) PADS Use as directed. 100 each 11  . amLODipine (NORVASC) 5 MG tablet TAKE 1 TABLET (5 MG TOTAL) BY MOUTH DAILY. 90 tablet 2  . B-D UF III MINI PEN NEEDLES 31G X 5 MM MISC Use when checking blood sugar. 100 each 11  . bimatoprost (LUMIGAN) 0.01 % SOLN Place 1 drop into both eyes at bedtime. 7.5 mL 0  . blood glucose meter kit and supplies Dispense a Accu Check meter..  E11.29 1 each 0  . brimonidine (ALPHAGAN P) 0.1 % SOLN PLACE 1 DROP INTO BOTH EYES 2 (TWO) TIMES DAILY. 5 mL 1  . Calcium Carb-Cholecalciferol (CALCIUM 600 + D PO) Take 1 tablet by mouth daily.     . Cholecalciferol (VITAMIN D3) 1000 UNITS CAPS Take 1 capsule by mouth daily.     . clopidogrel (PLAVIX) 75 MG tablet Take 1 tablet (75 mg total) by mouth daily. 90 tablet 2  . CRANBERRY PO Take 500 mg by mouth 2 (two) times daily.     Marland Kitchen exemestane (AROMASIN) 25 MG tablet TAKE 1 TABLET EVERY DAY AT BEDTIME 90 tablet 6  . Fluocinolone Acetonide 0.01 % SHAM Apply to scalp once daily; work into [employer] and allow to remain on  scalp for ~5 minutes then rinse with water. 120 mL 1  . glucose blood (COOL BLOOD GLUCOSE TEST STRIPS) test strip Use as instructed 100 each 12  . hydrALAZINE (APRESOLINE) 50 MG tablet TAKE 1 TABLET FOUR TIMES DAILY 270 tablet 2  . Lancets (ACCU-CHEK SOFT TOUCH) lancets Use as instructed 100 each 12  . mirtazapine (REMERON) 7.5 MG tablet Take 1 tablet (7.5 mg total) by mouth daily. 90 tablet 0  . Multiple Vitamins-Minerals (MULTIVITAMIN GUMMIES ADULT PO) Take 1 tablet by mouth daily.     Marland Kitchen omeprazole (PRILOSEC) 20 MG capsule Take 1 capsule (20 mg total) by mouth daily. 90 capsule 2  . ranitidine (ZANTAC) 150 MG capsule Take 1 capsule (150 mg total) by mouth 2 (two) times daily. 180 capsule 2   No current facility-administered medications for this visit.     Past Medical History:  Diagnosis Date  . Anxiety state 01/01/2015  . Arthritis    "all over"  . Breast cancer ([place])    Hx of R breast cancer s/p mastectomy; Currently has L breast cancer (Diagnosed 2015).   . Breast cancer, left breast ([place]) 01/01/2015  . CKD (chronic kidney disease), stage III 02/18/2015  . Decubitus ulcer of sacral region, stage 2 01/01/2015  . Decubitus ulcer, buttock   . Diabetes mellitus with insulin therapy ([place]) 01/01/2015  .  Essential hypertension 01/01/2015  . Glaucoma 01/01/2015  . Glaucoma, bilateral   . History of breast cancer 01/01/2015  . History of DVT (deep vein thrombosis) 1980's   BLE  . HLD (hyperlipidemia) 01/01/2015  . Hyperlipidemia   . Hypertension   . Itchy scalp 02/10/2015  . Osteoarthritis 01/01/2015  . Recurrent UTI 03/06/2015  . Right knee pain 02/10/2015  . TIA (transient ischemic attack) 02/18/2015  . Type II diabetes mellitus (HCC)    Insulin dependent.     Past Surgical History:  Procedure Laterality Date  . ABDOMINAL HYSTERECTOMY    . BREAST BIOPSY Right 1980's   +  . BREAST EXCISIONAL BIOPSY Left 2015   + stereo no surgery  . CHOLECYSTECTOMY    . MASTECTOMY Right 1980's  .  MEDIAL PARTIAL KNEE REPLACEMENT Right 1980's?  . PERIPHERAL VASCULAR CATHETERIZATION  04/28/2015   Procedure: Lower Extremity Intervention;  Surgeon: Algernon Huxley, MD;  Location: [facility];  Service: Cardiovascular;;  . PERIPHERAL VASCULAR CATHETERIZATION N/A 04/28/2015   Procedure: Abdominal Aortogram w/Lower Extremity;  Surgeon: Algernon Huxley, MD;  Location: [facility];  Service: Cardiovascular;  Laterality: N/A;  . PERIPHERAL VASCULAR CATHETERIZATION Left 10/20/2015   Procedure: Lower Extremity Angiography;  Surgeon: Algernon Huxley, MD;  Location: [facility];  Service: Cardiovascular;  Laterality: Left;  . PERIPHERAL VASCULAR CATHETERIZATION  10/20/2015   Procedure: Lower Extremity Intervention;  Surgeon: Algernon Huxley, MD;  Location: [facility];  Service: Cardiovascular;;    Social History Social History  Substance Use Topics  . Smoking status: Never Smoker  . Smokeless tobacco: Never Used  . Alcohol use No    Family History Family History  Problem Relation Age of Onset  . Heart disease Mother   . Diabetes Son   . Diabetes Son   . Kidney disease Son   . Kidney cancer Neg Hx   . Bladder Cancer Neg Hx   . Prostate cancer Neg Hx     No Known Allergies   REVIEW OF SYSTEMS (Negative unless checked)  Constitutional: [] Weight loss  [] Fever  [] Chills Cardiac: [] Chest pain   [] Chest pressure   [] Palpitations   [] Shortness of breath when laying flat   [] Shortness of breath at rest   [] Shortness of breath with exertion. Vascular:  [] Pain in legs with walking   [] Pain in legs at rest   [] Pain in legs when laying flat   [] Claudication   [] Pain in feet when walking  [] Pain in feet at rest  [] Pain in feet when laying flat   [] History of DVT   [] Phlebitis   [] Swelling in legs   [] Varicose veins   [] Non-healing ulcers Pulmonary:   [] Uses home oxygen   [] Productive cough   [] Hemoptysis   [] Wheeze  [] COPD   [] Asthma Neurologic:  [] Dizziness  [] Blackouts    [] Seizures   [x] History of stroke   [] History of TIA  [] Aphasia   [] Temporary blindness   [] Dysphagia   [x] Weakness or numbness in arms   [x] Weakness or numbness in legs Musculoskeletal:  [] Arthritis   [] Joint swelling   [] Joint pain   [] Low back pain Hematologic:  [] Easy bruising  [] Easy bleeding   [] Hypercoagulable state   [] Anemic   Gastrointestinal:  [] Blood in stool   [] Vomiting blood  [] Gastroesophageal reflux/heartburn   [] Abdominal pain Genitourinary:  [x] Chronic kidney disease   [] Difficult urination  [] Frequent urination  [] Burning with urination   [] Hematuria Skin:  [] Rashes   [] Ulcers   []   Wounds Psychological:  [] History of anxiety   []  History of major depression.  Physical Examination  BP (!) 176/78   Pulse 100   Resp 16  Gen:  WD/WN, NAD Head: [place]/AT, No temporalis wasting. Ear/Nose/Throat: Hearing grossly intact, nares w/o erythema or drainage, trachea midline Eyes: Conjunctiva clear. Sclera non-icteric Neck: Supple.  No JVD.  Pulmonary:  Good air movement, no use of accessory muscles.  Cardiac: RRR, normal S1, S2 Vascular:  Vessel Right Left  Radial Palpable Palpable  Ulnar Palpable Palpable  Brachial Palpable Palpable  Carotid Palpable, without bruit Palpable, without bruit  Aorta Not palpable N/A  Femoral Palpable Palpable  Popliteal Palpable Not Palpable  PT Palpable 1+ Palpable  DP Palpable Trace Palpable   Gastrointestinal: soft, non-tender/non-distended. No guarding/reflex.  Musculoskeletal: Right-sided weakness is present. 1+ bilateral lower extremity edema. Neurologic: Speech is a little difficult to discern. Right-sided weakness. Psychiatric: Judgment intact, Mood & affect appropriate for pt's clinical situation. Dermatologic: No rashes or ulcers noted.  No cellulitis or open wounds. Lymph : No Cervical, Axillary, or Inguinal lymphadenopathy.      Labs Recent Results (from the past 2160 hour(s))  Urine culture     Status: Abnormal   Collection  Time: 06/26/16  9:00 AM  Result Value Ref Range   Specimen Description URINE, RANDOM    Special Requests NONE    Culture MULTIPLE SPECIES PRESENT, SUGGEST RECOLLECTION (A)    Report Status 06/28/2016 FINAL   Urinalysis, Complete w Microscopic     Status: Abnormal   Collection Time: 06/26/16  9:00 AM  Result Value Ref Range   Color, Urine YELLOW (A) YELLOW   APPearance HAZY (A) CLEAR   Specific Gravity, Urine 1.019 1.005 - 1.030   pH 6.0 5.0 - 8.0   Glucose, UA 50 (A) NEGATIVE mg/dL   Hgb urine dipstick NEGATIVE NEGATIVE   Bilirubin Urine NEGATIVE NEGATIVE   Ketones, ur NEGATIVE NEGATIVE mg/dL   Protein, ur NEGATIVE NEGATIVE mg/dL   Nitrite NEGATIVE NEGATIVE   Leukocytes, UA NEGATIVE NEGATIVE   RBC / HPF 0-5 0 - 5 RBC/hpf   WBC, UA 0-5 0 - 5 WBC/hpf   Bacteria, UA RARE (A) NONE SEEN   Squamous Epithelial / LPF 6-30 (A) NONE SEEN   Mucous PRESENT    Amorphous Crystal PRESENT     Radiology No results found.    Assessment/Plan  DM (diabetes mellitus), type 2 with neurological complications (HCC) blood glucose control important in reducing the progression of atherosclerotic disease. Also, involved in wound healing. On appropriate medications.   Essential hypertension blood pressure control important in reducing the progression of atherosclerotic disease. On appropriate oral medications.   Peripheral vascular disease ([place]) Given her immobility following her stroke, she does not really have any claudication symptoms. She is not currently having any ischemic rest pain. Her ABIs today are noncompressible with normal waveforms and digital pressures on the right. Her left ABIs actually significantly improved up to 0.55 with a digital pressure of 61. She has no limb threatening symptoms. We will see her back in 6 months with noninvasive studies.    Leotis Pain, MD  06/29/2016 4:58 PM    This note was created with Dragon medical transcription system.  Any errors from dictation  are purely unintentional

## 2016-06-29 NOTE — Assessment & Plan Note (Signed)
blood glucose control important in reducing the progression of atherosclerotic disease. Also, involved in wound healing. On appropriate medications.  

## 2016-07-09 IMAGING — CR DG CHEST 1V
1 series · 1 of 1 positions shown · non-contrast
Comparison: PA and lateral chest x-ray February 18, 2015

CLINICAL DATA: Cough, shortness of breath, fever ; history of
breast malignancy.

EXAM:
CHEST 1 VIEW

[view not recorded]
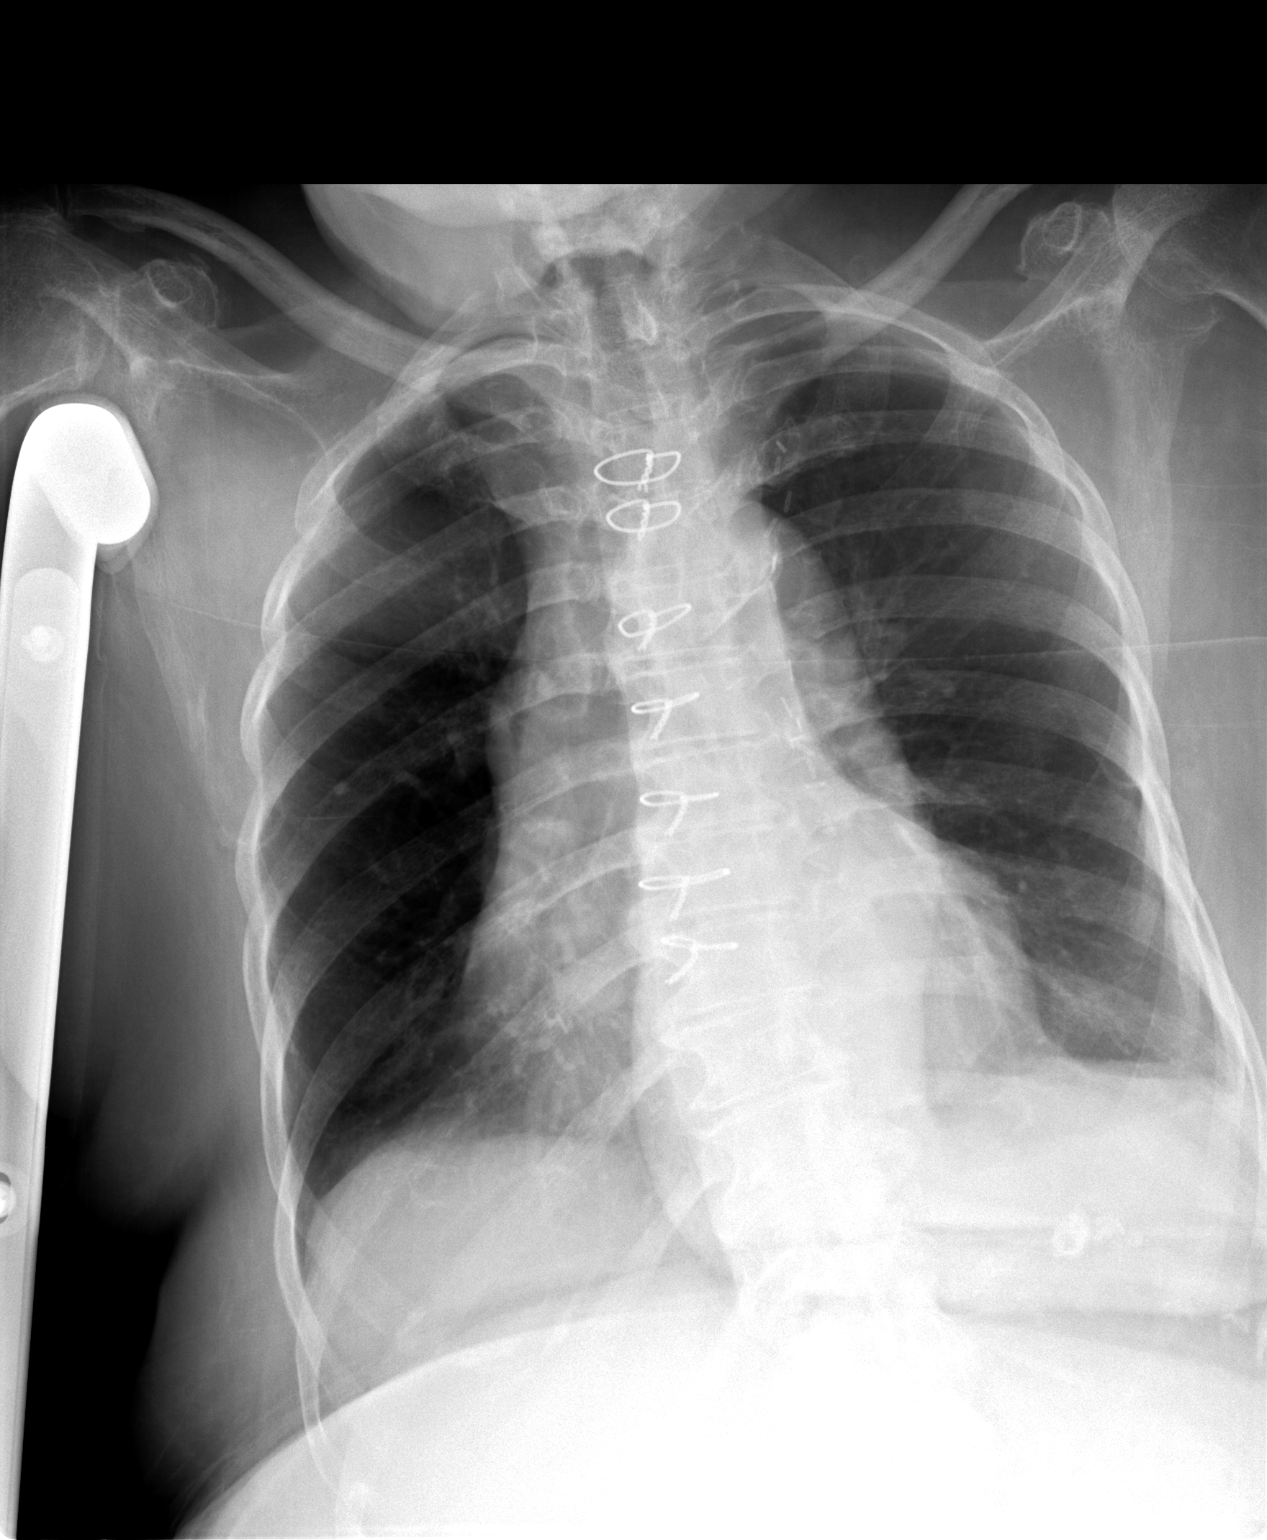

[1 of 1 positions shown; findings below may reference images not displayed]

FINDINGS: There is no focal infiltrate. There is no pleural effusion. The
cardiac silhouette is mildly enlarged but stable. The pulmonary
vascularity is normal. The patient has undergone previous median
sternotomy and internal mammary artery dissection. There
degenerative changes of both shoulders.
IMPRESSION: There is no evidence of pneumonia nor CHF. There is mild stable
cardiomegaly.

## 2016-07-14 ENCOUNTER — Telehealth: Payer: Self-pay | Admitting: *Deleted

## 2016-07-14 NOTE — Telephone Encounter (Signed)
Mel from [facility] requested a continued verbal order for a home health aid.  Contact Mel [phone]

## 2016-07-14 NOTE — Telephone Encounter (Signed)
Verbal okay to give? 

## 2016-07-14 NOTE — Telephone Encounter (Signed)
Mel was called and given verbal order for home health aid.

## 2016-07-14 NOTE — Telephone Encounter (Signed)
Yes

## 2016-07-27 NOTE — Progress Notes (Signed)
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  INTERVAL HISTORY:  Patient returns to clinic for routine 6 month follow up. She has chronic weakness and fatigue, but otherwise feels well. She continues to tolerate Aromasin without significant side effects. She has no neurologic complaints.  She denies any recent fevers or illnesses. She denies any chest pain or shortness of breath.  She has no nausea, vomiting, constipation, or diarrhea. She has no urinary complaints.  Patient offers no further specific complaints.   REVIEW OF SYSTEMS:   Review of Systems  Constitutional: Positive for malaise/fatigue. Negative for fever and weight loss.  Respiratory: Negative.  Negative for cough and shortness of breath.   Cardiovascular: Negative.  Negative for chest pain and leg swelling.  Gastrointestinal: Negative.  Negative for abdominal pain.  Genitourinary: Negative.   Musculoskeletal: Negative.   Neurological: Positive for weakness. Negative for sensory change.  Psychiatric/Behavioral: Negative.  The patient is not nervous/anxious.     As per HPI. Otherwise, a complete review of systems is negative.  PAST MEDICAL HISTORY: Past Medical History:  Diagnosis Date  . Anxiety state 01/01/2015  . Arthritis    "all over"  . Breast cancer ([place])    Hx of R breast cancer s/p mastectomy; Currently has L breast cancer (Diagnosed 2015).   . Breast cancer, left breast ([place]) 01/01/2015  . CKD (chronic kidney disease), stage III 02/18/2015  . Decubitus ulcer of sacral region, stage 2 01/01/2015  . Decubitus ulcer, buttock   . Diabetes mellitus with insulin therapy ([place]) 01/01/2015  . Essential hypertension 01/01/2015  . Glaucoma 01/01/2015  .  Glaucoma, bilateral   . History of breast cancer 01/01/2015  . History of DVT (deep vein thrombosis) 1980's   BLE  . HLD (hyperlipidemia) 01/01/2015  . Hyperlipidemia   . Hypertension   . Itchy scalp 02/10/2015  . Osteoarthritis 01/01/2015  . Recurrent UTI 03/06/2015  . Right knee pain 02/10/2015  . TIA (transient ischemic attack) 02/18/2015  . Type II diabetes mellitus (HCC)    Insulin dependent.     PAST SURGICAL HISTORY: Past Surgical History:  Procedure Laterality Date  . ABDOMINAL HYSTERECTOMY    . BREAST BIOPSY Right 1980's   +  . BREAST EXCISIONAL BIOPSY Left 2015   + stereo no surgery  . CHOLECYSTECTOMY    . MASTECTOMY Right 1980's  . MEDIAL PARTIAL KNEE REPLACEMENT Right 1980's?  . PERIPHERAL VASCULAR CATHETERIZATION  04/28/2015   Procedure: Lower Extremity Intervention;  Surgeon: Algernon Huxley, MD;  Location: [facility];  Service: Cardiovascular;;  . PERIPHERAL VASCULAR CATHETERIZATION N/A 04/28/2015   Procedure: Abdominal Aortogram w/Lower Extremity;  Surgeon: Algernon Huxley, MD;  Location: [facility];  Service: Cardiovascular;  Laterality: N/A;  . PERIPHERAL VASCULAR CATHETERIZATION Left 10/20/2015   Procedure: Lower Extremity Angiography;  Surgeon: Algernon Huxley, MD;  Location: [facility];  Service: Cardiovascular;  Laterality: Left;  . PERIPHERAL VASCULAR CATHETERIZATION  10/20/2015   Procedure: Lower Extremity Intervention;  Surgeon: Algernon Huxley, MD;  Location: [facility];  Service: Cardiovascular;;    FAMILY HISTORY Family History  Problem Relation Age of Onset  . Heart disease Mother   . Diabetes Son   .  Diabetes Son   . Kidney disease Son   . Kidney cancer Neg Hx   . Bladder Cancer Neg Hx   . Prostate cancer Neg Hx     GYNECOLOGIC HISTORY:  No LMP recorded. Patient has had a hysterectomy.     ADVANCED DIRECTIVES:    HEALTH MAINTENANCE: Social History  Substance Use Topics  . Smoking status: Never Smoker  . Smokeless  tobacco: Never Used  . Alcohol use No    No Known Allergies  Current Outpatient Prescriptions  Medication Sig Dispense Refill  . Alcohol Swabs (B-D SINGLE USE SWABS REGULAR) PADS Use as directed. 100 each 11  . amLODipine (NORVASC) 5 MG tablet TAKE 1 TABLET (5 MG TOTAL) BY MOUTH DAILY. 90 tablet 2  . B-D UF III MINI PEN NEEDLES 31G X 5 MM MISC Use when checking blood sugar. 100 each 11  . bimatoprost (LUMIGAN) 0.01 % SOLN Place 1 drop into both eyes at bedtime. 7.5 mL 0  . blood glucose meter kit and supplies Dispense a Accu Check meter..  E11.29 1 each 0  . brimonidine (ALPHAGAN P) 0.1 % SOLN PLACE 1 DROP INTO BOTH EYES 2 (TWO) TIMES DAILY. 5 mL 1  . Calcium Carb-Cholecalciferol (CALCIUM 600 + D PO) Take 1 tablet by mouth daily.     . Cholecalciferol (VITAMIN D3) 1000 UNITS CAPS Take 1 capsule by mouth daily.     . clopidogrel (PLAVIX) 75 MG tablet Take 1 tablet (75 mg total) by mouth daily. 90 tablet 2  . CRANBERRY PO Take 500 mg by mouth 2 (two) times daily.     Marland Kitchen exemestane (AROMASIN) 25 MG tablet TAKE 1 TABLET EVERY DAY AT BEDTIME 90 tablet 6  . Fluocinolone Acetonide 0.01 % SHAM Apply to scalp once daily; work into [employer] and allow to remain on scalp for ~5 minutes then rinse with water. 120 mL 1  . glucose blood (COOL BLOOD GLUCOSE TEST STRIPS) test strip Use as instructed 100 each 12  . hydrALAZINE (APRESOLINE) 50 MG tablet TAKE 1 TABLET FOUR TIMES DAILY 270 tablet 2  . ketoconazole (NIZORAL) 2 % shampoo 2 application.    . Lancets (ACCU-CHEK SOFT TOUCH) lancets Use as instructed 100 each 12  . Multiple Vitamins-Minerals (MULTIVITAMIN GUMMIES ADULT PO) Take 1 tablet by mouth daily.     Marland Kitchen omeprazole (PRILOSEC) 20 MG capsule Take 1 capsule (20 mg total) by mouth daily. 90 capsule 2  . ranitidine (ZANTAC) 150 MG capsule Take 1 capsule (150 mg total) by mouth 2 (two) times daily. 180 capsule 2  . mirtazapine (REMERON) 7.5 MG tablet Take 1 tablet (7.5 mg total) by mouth daily. (Patient  not taking: Reported on 07/28/2016) 90 tablet 0   No current facility-administered medications for this visit.     OBJECTIVE: BP (!) 181/76 (BP Location: Left Arm, Patient Position: Sitting)   Pulse 80   Temp 97.6 F (36.4 C) (Tympanic)   Wt 169 lb (76.7 kg)   BMI 31.93 kg/m    Body mass index is 31.93 kg/m.    ECOG FS:2 - Symptomatic, <50% confined to bed  General: Well-developed, well-nourished, no acute distress. Sitting in wheelchair. Eyes: Pink conjunctiva, anicteric sclera. Lungs: Clear to auscultation bilaterally. Heart: Irregular rate and rhythm. No rubs, murmurs, or gallops. Abdomen: Soft, nontender, nondistended. No organomegaly noted, normoactive bowel sounds. Breast: Right chest without evidence of recurrence. Musculoskeletal: No edema, cyanosis, or clubbing. Neuro: Alert, answering all questions appropriately. Cranial nerves grossly intact. Skin: No rashes  or petechiae noted. Psych: Normal affect.  LAB RESULTS:  No visits with results within 3 Day(s) from this visit.  Latest known visit with results is:  Hospital Outpatient Visit on 06/26/2016  Component Date Value Ref Range Status  . Specimen Description 06/26/2016 URINE, RANDOM   Final  . Special Requests 06/26/2016 NONE   Final  . Culture 06/26/2016 MULTIPLE SPECIES PRESENT, SUGGEST RECOLLECTION*  Final  . Report Status 06/26/2016 06/28/2016 FINAL   Final  . Color, Urine 06/26/2016 YELLOW* YELLOW Final  . APPearance 06/26/2016 HAZY* CLEAR Final  . Specific Gravity, Urine 06/26/2016 1.019  1.005 - 1.030 Final  . pH 06/26/2016 6.0  5.0 - 8.0 Final  . Glucose, UA 06/26/2016 50* NEGATIVE mg/dL Final  . Hgb urine dipstick 06/26/2016 NEGATIVE  NEGATIVE Final  . Bilirubin Urine 06/26/2016 NEGATIVE  NEGATIVE Final  . Ketones, ur 06/26/2016 NEGATIVE  NEGATIVE mg/dL Final  . Protein, ur 06/26/2016 NEGATIVE  NEGATIVE mg/dL Final  . Nitrite 06/26/2016 NEGATIVE  NEGATIVE Final  . Leukocytes, UA 06/26/2016 NEGATIVE   NEGATIVE Final  . RBC / HPF 06/26/2016 0-5  0 - 5 RBC/hpf Final  . WBC, UA 06/26/2016 0-5  0 - 5 WBC/hpf Final  . Bacteria, UA 06/26/2016 RARE* NONE SEEN Final  . Squamous Epithelial / LPF 06/26/2016 6-30* NONE SEEN Final  . Mucous 06/26/2016 PRESENT   Final  . Amorphous Crystal 06/26/2016 PRESENT   Final    STUDIES: No results found.  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  PLAN:    1. 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 At the time of diagnosis It was determined that given patient's advanced age and performance status she should not undergo surgery or XRT. Continue Aromasin for at least 5 years through October 2020.  Her most recent Mammogram on March 25, 2016 was reports at as BI-RADS 6, but improved since her previous mammogram 1 year prior. Repeat in October 2018. Return to clinic in 6 months for further evaluation.  Patient expressed understanding and was in agreement with this plan. She also understands that She can call clinic at any time with any questions, concerns, or complaints.    Lloyd Huger, MD   07/31/2016 8:54 AM

## 2016-07-28 ENCOUNTER — Inpatient Hospital Stay: Payer: Medicare PPO | Attending: Oncology | Admitting: Oncology

## 2016-07-28 VITALS — BP 181/76 | HR 80 | Temp 97.6°F | Wt 169.0 lb

## 2016-07-28 DIAGNOSIS — Z9011 Acquired absence of right breast and nipple: Secondary | ICD-10-CM | POA: Diagnosis not present

## 2016-07-28 DIAGNOSIS — E785 Hyperlipidemia, unspecified: Secondary | ICD-10-CM | POA: Diagnosis not present

## 2016-07-28 DIAGNOSIS — Z79899 Other long term (current) drug therapy: Secondary | ICD-10-CM | POA: Insufficient documentation

## 2016-07-28 DIAGNOSIS — M1990 Unspecified osteoarthritis, unspecified site: Secondary | ICD-10-CM | POA: Insufficient documentation

## 2016-07-28 DIAGNOSIS — Z86718 Personal history of other venous thrombosis and embolism: Secondary | ICD-10-CM | POA: Insufficient documentation

## 2016-07-28 DIAGNOSIS — Z853 Personal history of malignant neoplasm of breast: Secondary | ICD-10-CM | POA: Insufficient documentation

## 2016-07-28 DIAGNOSIS — Z170 Estrogen receptor positive status [ER+]: Secondary | ICD-10-CM | POA: Insufficient documentation

## 2016-07-28 DIAGNOSIS — I10 Essential (primary) hypertension: Secondary | ICD-10-CM | POA: Insufficient documentation

## 2016-07-28 DIAGNOSIS — E119 Type 2 diabetes mellitus without complications: Secondary | ICD-10-CM | POA: Insufficient documentation

## 2016-07-28 DIAGNOSIS — C50512 Malignant neoplasm of lower-outer quadrant of left female breast: Secondary | ICD-10-CM | POA: Diagnosis not present

## 2016-07-28 DIAGNOSIS — F419 Anxiety disorder, unspecified: Secondary | ICD-10-CM | POA: Insufficient documentation

## 2016-07-28 DIAGNOSIS — Z79811 Long term (current) use of aromatase inhibitors: Secondary | ICD-10-CM | POA: Diagnosis not present

## 2016-08-10 ENCOUNTER — Ambulatory Visit (INDEPENDENT_AMBULATORY_CARE_PROVIDER_SITE_OTHER): Payer: Medicare PPO

## 2016-08-10 VITALS — BP 140/80 | HR 98 | Temp 98.2°F | Resp 14 | Ht 64.0 in

## 2016-08-10 DIAGNOSIS — Z0000 Encounter for general adult medical examination without abnormal findings: Secondary | ICD-10-CM

## 2016-08-10 NOTE — Progress Notes (Signed)
Subjective:   [name] is a 83 y.o. female who presents for Medicare Annual (Subsequent) preventive examination.  Review of Systems:  No ROS.  Medicare Wellness Visit. Cardiac Risk Factors include: advanced age (>32mn, >>27women);hypertension;diabetes mellitus     Objective:     Vitals: BP 140/80 (BP Location: Left Arm, Patient Position: Sitting, Cuff Size: Normal)   Pulse 98   Temp 98.2 F (36.8 C) (Oral)   Resp 14   Ht 5' 4"  (1.626 m)   SpO2 98%   There is no height or weight on file to calculate BMI.   Tobacco History  Smoking Status  . Never Smoker  Smokeless Tobacco  . Never Used     Counseling given: Not Answered   Past Medical History:  Diagnosis Date  . Anxiety state 01/01/2015  . Arthritis    "all over"  . [place]    Hx of R breast cancer s/p mastectomy; Currently has L breast cancer (Diagnosed 2015).   . Breast cancer, left breast ([place] 01/01/2015  . CKD (chronic kidney disease), stage III 02/18/2015  . Decubitus ulcer of sacral region, stage 2 01/01/2015  . Decubitus ulcer, buttock   . Diabetes mellitus with insulin therapy ([place] 01/01/2015  . Essential hypertension 01/01/2015  . Glaucoma 01/01/2015  . Glaucoma, bilateral   . History of breast cancer 01/01/2015  . History of DVT (deep vein thrombosis) 1980's   BLE  . HLD (hyperlipidemia) 01/01/2015  . Hyperlipidemia   . Hypertension   . Itchy scalp 02/10/2015  . Osteoarthritis 01/01/2015  . Recurrent UTI 03/06/2015  . Right knee pain 02/10/2015  . TIA (transient ischemic attack) 02/18/2015  . Type II diabetes mellitus (HCC)    Insulin dependent.    Past Surgical History:  Procedure Laterality Date  . ABDOMINAL HYSTERECTOMY    . BREAST BIOPSY Right 1980's   +  . BREAST EXCISIONAL BIOPSY Left 2015   + stereo no surgery  . CHOLECYSTECTOMY    . MASTECTOMY Right 1980's  . MEDIAL PARTIAL KNEE REPLACEMENT Right 1980's?  . PERIPHERAL VASCULAR CATHETERIZATION  04/28/2015   Procedure: Lower  Extremity Intervention;  Surgeon: JAlgernon Huxley MD;  [facility];  Service: Cardiovascular;;  . PERIPHERAL VASCULAR CATHETERIZATION N/A 04/28/2015   Procedure: Abdominal Aortogram w/Lower Extremity;  Surgeon: JAlgernon Huxley MD;  [facility];  Service: Cardiovascular;  Laterality: N/A;  . PERIPHERAL VASCULAR CATHETERIZATION Left 10/20/2015   Procedure: Lower Extremity Angiography;  Surgeon: JAlgernon Huxley MD;  [facility];  Service: Cardiovascular;  Laterality: Left;  . PERIPHERAL VASCULAR CATHETERIZATION  10/20/2015   Procedure: Lower Extremity Intervention;  Surgeon: JAlgernon Huxley MD;  [facility];  Service: Cardiovascular;;   Family History  Problem Relation Age of Onset  . Heart disease Mother   . Diabetes Son   . Diabetes Son   . Kidney disease Son   . Kidney cancer Neg Hx   . Bladder Cancer Neg Hx   . Prostate cancer Neg Hx    History  Sexual Activity  . Sexual activity: No    Outpatient Encounter Prescriptions as of 08/10/2016  Medication Sig  . Alcohol Swabs (B-D SINGLE USE SWABS REGULAR) PADS Use as directed.  .Marland KitchenamLODipine (NORVASC) 5 MG tablet TAKE 1 TABLET (5 MG TOTAL) BY MOUTH DAILY.  .Marland KitchenB-D UF III MINI PEN NEEDLES 31G X 5 MM MISC Use when checking blood sugar.  .Marland Kitchen  bimatoprost (LUMIGAN) 0.01 % SOLN Place 1 drop into both eyes at bedtime.  . blood glucose meter kit and supplies Dispense a Accu Check meter..  E11.29  . brimonidine (ALPHAGAN P) 0.1 % SOLN PLACE 1 DROP INTO BOTH EYES 2 (TWO) TIMES DAILY.  . Calcium Carb-Cholecalciferol (CALCIUM 600 + D PO) Take 1 tablet by mouth daily.   . Cholecalciferol (VITAMIN D3) 1000 UNITS CAPS Take 1 capsule by mouth daily.   . clopidogrel (PLAVIX) 75 MG tablet Take 1 tablet (75 mg total) by mouth daily.  Marland Kitchen CRANBERRY PO Take 500 mg by mouth 2 (two) times daily.   Marland Kitchen exemestane (AROMASIN) 25 MG tablet TAKE 1 TABLET EVERY DAY AT BEDTIME  . Fluocinolone Acetonide 0.01 % SHAM  Apply to scalp once daily; work into [employer] and allow to remain on scalp for ~5 minutes then rinse with water.  Marland Kitchen glucose blood (COOL BLOOD GLUCOSE TEST STRIPS) test strip Use as instructed  . hydrALAZINE (APRESOLINE) 50 MG tablet TAKE 1 TABLET FOUR TIMES DAILY  . ketoconazole (NIZORAL) 2 % shampoo 2 application.  . Lancets (ACCU-CHEK SOFT TOUCH) lancets Use as instructed  . mirtazapine (REMERON) 7.5 MG tablet Take 1 tablet (7.5 mg total) by mouth daily.  . Multiple Vitamins-Minerals (MULTIVITAMIN GUMMIES ADULT PO) Take 1 tablet by mouth daily.   Marland Kitchen omeprazole (PRILOSEC) 20 MG capsule Take 1 capsule (20 mg total) by mouth daily.  . ranitidine (ZANTAC) 150 MG capsule Take 1 capsule (150 mg total) by mouth 2 (two) times daily.   No facility-administered encounter medications on file as of 08/10/2016.     Activities of Daily Living In your present state of health, do you have any difficulty performing the following activities: 08/10/2016 08/11/2015  Hearing? N N  Vision? N N  Difficulty concentrating or making decisions? Y N  Walking or climbing stairs? Y Y  Dressing or bathing? Y Y  Doing errands, shopping? Tempie Donning  Preparing Food and eating ? Y N  Using the Toilet? Y N  In the past six months, have you accidently leaked urine? Y N  Do you have problems with loss of bowel control? Y N  Managing your Medications? Y Y  Managing your Finances? Tempie Donning  Housekeeping or managing your Housekeeping? Tempie Donning  Some recent data might be hidden    Patient Care Team: Coral Spikes, DO as PCP - General (Family Medicine)    Assessment:    This is a routine wellness examination for Nivedita. The goal of the wellness visit is to assist the patient how to close the gaps in care and create a preventative care plan for the patient.   Taking calcium VIT D as appropriate/Osteoporosis risk reviewed.  Medications reviewed; taking without issues or barriers.  Safety issues reviewed; smoke detectors in the home. No  firearms in the home. Wears seatbelts when riding with others. Patient does wear sunscreen or protective clothing when in direct sunlight. No violence in the home.    Patient is alert, normal appearance, oriented to person/place/and time. Incorrectly identified the president of the [place], recall of 0/3 objects, and unable to perform simple calculations.  Patient displays appropriate judgement and can read correct time from watch face.  No otherwise identified risk were noted.  Wheelchair dependent.  Assistance required when performing ADLs.   BMI- discussed the importance of a healthy diet, water intake and exercise. Educational material provided.   HTN- followed by PCP.  Eye- Visual acuity not assessed per  patient preference since they have regular follow up with the ophthalmologist.  Wears corrective lenses.  Sleep patterns- Sleeps 7-8 hours at night.  Wakes feeling rested.  TDAP vaccine deferred per patient preference.  Follow up with insurance.  Educational material provided.  PNA 23/Prevnar 13 vaccine; discussed. Educational material provided.  Patient Concerns: L foot, great toe; redness and irritation.  Follow up appointment scheduled with PCP.  Exercise Activities and Dietary recommendations Current Exercise Habits: Home exercise routine (Chair exercises), Time (Minutes): 10, Frequency (Times/Week): 7, Weekly Exercise (Minutes/Week): 70, Intensity: Mild  Goals    . Healthy Lifestyle          Maintain range of motion chair exercises Stay hydrated and drink plenty of water Choose leafy green foods, and lean meats (chicken, [place], fish)      Fall Risk Fall Risk  08/10/2016 09/29/2015 08/11/2015 01/01/2015  Falls in the past year? No Yes Yes Yes  Number falls in past yr: - 2 or more 1 1  Injury with Fall? - Yes Yes Yes  Risk Factor Category  - High Fall Risk High Fall Risk -  Risk for fall due to : - History of fall(s);Impaired mobility Impaired balance/gait -  Follow up -  - Education provided;Falls prevention discussed -   Depression Screen PHQ 2/9 Scores 08/10/2016 09/29/2015 08/11/2015 01/01/2015  PHQ - 2 Score 0 0 0 0     Cognitive Function MMSE - Mini Mental State Exam 08/10/2016 08/11/2015  Not completed: Unable to complete -  Orientation to time - 5  Orientation to Place - 5  Registration - 3  Attention/ Calculation - 5  Recall - 0  Language- name 2 objects - 2  Language- repeat - 1  Language- follow 3 step command - 3  Language- read & follow direction - 0  Write a sentence - 0  Copy design - 0  Total score - 24     6CIT Screen 08/10/2016  What Year? 4 points  What month? 3 points  What time? 0 points  Count back from 20 (No Data)  Months in reverse (No Data)  Repeat phrase (No Data)    Immunization History  Administered Date(s) Administered  . Influenza,inj,Quad PF,36+ Mos 02/03/2016  . Pneumococcal Polysaccharide-23 02/20/2015   Screening Tests Health Maintenance  Topic Date Due  . TETANUS/TDAP  02/06/1943  . DEXA SCAN  02/05/1989  . FOOT EXAM  01/01/2016  . OPHTHALMOLOGY EXAM  01/22/2016  . PNA vac Low Risk Adult (2 of 2 - PCV13) 02/20/2016  . HEMOGLOBIN A1C  03/03/2016  . INFLUENZA VACCINE  Completed      Plan:    End of life planning; Advance aging; Advanced directives discussed. Copy of current HCPOA/Living Will requested.    Medicare Attestation I have personally reviewed: The patient's medical and social history Their use of alcohol, tobacco or illicit drugs Their current medications and supplements The patient's functional ability including ADLs,fall risks, home safety risks, cognitive, and hearing and visual impairment Diet and physical activities Evidence for depression   The patient's weight, height, BMI, and visual acuity have been recorded in the chart.  I have made referrals and provided education to the patient based on review of the above and I have provided the patient with a written personalized care  plan for preventive services.    During the course of the visit the patient was educated and counseled about the following appropriate screening and preventive services:   Vaccines to include Pneumoccal, Influenza,  Hepatitis B, Td, Zostavax, HCV  Colorectal cancer screening-UTD  Bone density screening-educational material provided  Diabetes-Hgb A1C followed by PCP  Glaucoma screening-annual exams with eye drops   Mammography-aged out   Nutrition counseling   Patient Instructions (the written plan) was given to the patient.   Varney Biles, LPN  2/00/3794

## 2016-08-10 NOTE — Patient Instructions (Addendum)
  Ms. [name] , Thank you for taking time to come for your Medicare Wellness Visit. I appreciate your ongoing commitment to your health goals. Please review the following plan we discussed and let me know if I can assist you in the future.   Follow up with Dr. Lacinda Axon as needed.    Bring a copy of your [place] and/or Living Will to be scanned into chart.  Have a great day!  These are the goals we discussed: Goals    . Healthy Lifestyle          Maintain range of motion chair exercises Stay hydrated and drink plenty of water Choose leafy green foods, and lean meats (chicken, [place], fish)       This is a list of the screening recommended for you and due dates:  Health Maintenance  Topic Date Due  . Tetanus Vaccine  02/06/1943  . DEXA scan (bone density measurement)  02/05/1989  . Complete foot exam   01/01/2016  . Eye exam for diabetics  01/22/2016  . Pneumonia vaccines (2 of 2 - PCV13) 02/20/2016  . Hemoglobin A1C  03/03/2016  . Flu Shot  Completed

## 2016-08-11 NOTE — Progress Notes (Signed)
Care was provided under my supervision. I agree with the management as indicated in the note.  Teran Knittle DO  

## 2016-08-24 ENCOUNTER — Telehealth: Payer: Self-pay | Admitting: Family Medicine

## 2016-08-24 ENCOUNTER — Ambulatory Visit: Payer: Self-pay | Admitting: Family Medicine

## 2016-08-24 NOTE — Telephone Encounter (Signed)
FYI - Pt daughter called to cancel appt. The weather is too much for the pt.

## 2016-08-25 ENCOUNTER — Other Ambulatory Visit: Payer: Self-pay | Admitting: Family Medicine

## 2016-08-25 MED ORDER — ACCU-CHEK SOFT TOUCH LANCETS MISC: 12 refills | Status: AC

## 2016-08-25 MED ORDER — GLUCOSE BLOOD VI STRP: ORAL_STRIP | 12 refills | Status: AC

## 2016-08-25 MED ORDER — HYDRALAZINE HCL 50 MG PO TABS: ORAL_TABLET | ORAL | 2 refills | Status: DC

## 2016-08-25 MED ORDER — BD SWAB SINGLE USE REGULAR PADS: MEDICATED_PAD | 11 refills | Status: AC

## 2016-08-25 MED ORDER — BIMATOPROST 0.01 % OP SOLN: 1.0000 [drp] | Freq: Every day | OPHTHALMIC | 0 refills | Status: DC

## 2016-08-25 NOTE — Addendum Note (Signed)
Addended by: Carmin Muskrat on: 08/25/2016 01:34 PM   Modules accepted: Orders

## 2016-08-25 NOTE — Addendum Note (Signed)
Addended by: Carmin Muskrat on: 08/25/2016 01:44 PM   Modules accepted: Orders

## 2016-08-25 NOTE — Telephone Encounter (Signed)
hydralazine Refilled: 02/03/16 Last OV: 02/03/16 Last Labs: 01/26/16 Future OV: 09/07/16 Please advise?  lumigan Refilled: 02/03/16 Last OV: 02/03/16 Last Labs: 01/26/16 Future OV: 09/07/16 Please advise?   Pt asking for refill on Fluocinolone oil 0.01% ear drops

## 2016-09-07 ENCOUNTER — Ambulatory Visit (INDEPENDENT_AMBULATORY_CARE_PROVIDER_SITE_OTHER): Payer: Medicare PPO

## 2016-09-07 ENCOUNTER — Ambulatory Visit (INDEPENDENT_AMBULATORY_CARE_PROVIDER_SITE_OTHER): Payer: Medicare PPO | Admitting: Family Medicine

## 2016-09-07 ENCOUNTER — Encounter: Payer: Self-pay | Admitting: Family Medicine

## 2016-09-07 VITALS — BP 140/84 | HR 98 | Temp 98.1°F

## 2016-09-07 DIAGNOSIS — L819 Disorder of pigmentation, unspecified: Secondary | ICD-10-CM | POA: Insufficient documentation

## 2016-09-07 DIAGNOSIS — E1149 Type 2 diabetes mellitus with other diabetic neurological complication: Secondary | ICD-10-CM | POA: Diagnosis not present

## 2016-09-07 DIAGNOSIS — J181 Lobar pneumonia, unspecified organism: Secondary | ICD-10-CM

## 2016-09-07 DIAGNOSIS — R05 Cough: Secondary | ICD-10-CM

## 2016-09-07 DIAGNOSIS — J189 Pneumonia, unspecified organism: Secondary | ICD-10-CM | POA: Insufficient documentation

## 2016-09-07 DIAGNOSIS — I739 Peripheral vascular disease, unspecified: Secondary | ICD-10-CM

## 2016-09-07 DIAGNOSIS — R059 Cough, unspecified: Secondary | ICD-10-CM | POA: Insufficient documentation

## 2016-09-07 DIAGNOSIS — I2510 Atherosclerotic heart disease of native coronary artery without angina pectoris: Secondary | ICD-10-CM | POA: Insufficient documentation

## 2016-09-07 LAB — POCT GLYCOSYLATED HEMOGLOBIN (HGB A1C): Hemoglobin A1C: 6

## 2016-09-07 MED ORDER — MOXIFLOXACIN HCL 400 MG PO TABS: 400.0000 mg | ORAL_TABLET | Freq: Every day | ORAL | 0 refills | Status: DC

## 2016-09-07 NOTE — Patient Instructions (Signed)
I will call with the results  We will call with her appt to see Dew.  Take care  Dr. Lacinda Axon

## 2016-09-07 NOTE — Assessment & Plan Note (Signed)
New problem. Uncertain etiology at this time. My only concern regarding the patient's area of hyperpigmentation is ischemia from peripheral artery disease. Arranging for her to see her vascular surgeon as soon as possible. She does have decent cap refill but I cannot appreciate her pulses. Per her last office visit with vascular surgery things appear to be stable.

## 2016-09-07 NOTE — Assessment & Plan Note (Deleted)
Mildly concern regarding the patient's area of hyperpigmentation is ischemia from peripheral artery disease. Arranging for her to see her vascular surgeon as soon as possible. She does have decent cap refill but I cannot appreciate her pulses. Per her last office visit with vascular surgery things appear to be stable.

## 2016-09-07 NOTE — Assessment & Plan Note (Signed)
New problem. Xray today for further eval.

## 2016-09-07 NOTE — Assessment & Plan Note (Signed)
New problem. X-ray revealed left lower lobe pneumonia. Treating with moxifloxacin.

## 2016-09-07 NOTE — Assessment & Plan Note (Signed)
A1C today. Sugars well controlled.

## 2016-09-07 NOTE — Progress Notes (Signed)
Subjective:  Patient ID: [name], female    DOB: Oct 23, 1921  Age: 83 y.o. MRN: [id number]  CC: Cough, Spot on great toe  HPI:  83 year old female with an extensive past medical history including breast cancer, hypertension, DM 2 with a complications, PAD, CKD, stroke presents with the above complaints.  Daughter reports that she's had a nonproductive cough since middle of last week. Mild in severity. She's had some associated "sweats". No documented fever. No known exacerbating or relieving factors. No other associated symptoms.   Additionally, patient was recently seen on 2/27 for a Medicare wellness visit. At that time the daughter expressed some concern about a small area on her left great toe. Daughter reports that since that time it appears to have gotten larger. It is hyperpigmented. No redness. No pain. The patient is not bothered by this. The daughter is quite concerned given her past history. No other associated symptoms. No other complaints at this time.   Social Hx   Social History   Social History  . Marital status: Widowed    Spouse name: N/A  . Number of children: N/A  . Years of education: N/A   Social History Main Topics  . Smoking status: Never Smoker  . Smokeless tobacco: Never Used  . Alcohol use No  . Drug use: No  . Sexual activity: No   Other Topics Concern  . None   Social History Narrative   Lives with daughter/daughter's husband.   Denita Lung [phone]).     Review of Systems  Constitutional:       "Sweats"  Respiratory: Positive for cough.   Skin:       Dark area - Left great toe.      Objective:  BP 140/84 (BP Location: Left Arm, Patient Position: Sitting, Cuff Size: Normal)   Pulse 98   Temp 98.1 F (36.7 C) (Oral)   SpO2 94%   BP/Weight 09/07/2016 08/10/2016 9/51/8841  Systolic BP 660 630 160  Diastolic BP 84 80 76  Wt. (Lbs) - - 169  BMI - - 31.93    Physical Exam  Constitutional:  Frail, chronically  ill-appearing female resting comfortably in her wheelchair.  Cardiovascular: Normal rate and regular rhythm.   Pulmonary/Chest: Effort normal.  Scattered crackles.  Musculoskeletal:  Left great toe - Small area of hyperpigmentation. Cannot appreciate pulses. No appreciable erythema. Nontender to palpation.  Neurological: She is alert.  Psychiatric:  Flat affect.  Vitals reviewed.   Lab Results  Component Value Date   WBC 11.9 (H) 01/26/2016   HGB 10.8 (L) 01/26/2016   HCT 32.7 (L) 01/26/2016   PLT 484 (H) 01/26/2016   GLUCOSE 155 (H) 01/26/2016   CHOL 84 02/19/2015   TRIG 88 02/19/2015   HDL 27 (L) 02/19/2015   LDLCALC 39 02/19/2015   ALT 13 (L) 01/26/2016   AST 21 01/26/2016   NA 138 01/26/2016   K 3.6 01/26/2016   CL 107 01/26/2016   CREATININE 0.60 01/26/2016   BUN 18 01/26/2016   CO2 26 01/26/2016   TSH 2.04 09/01/2015   HGBA1C 6.0 09/07/2016   Dg Chest 2 View  Result Date: 09/07/2016 CLINICAL DATA:  Cough and fever EXAM: CHEST  2 VIEW COMPARISON:  07/28/2015 FINDINGS: Previous median sternotomy and CABG. The right lung is clear. There is left lower lobe pneumonia. Small amount of pleural blunting on the left. No acute bone finding. IMPRESSION: Left lower lobe bronchopneumonia. Electronically Signed   By: Jan Fireman.D.  On: 09/07/2016 14:57     Assessment & Plan:   Problem List Items Addressed This Visit    DM (diabetes mellitus), type 2 with neurological complications ([place])    M0Q today. Sugars well controlled.       Relevant Orders   POCT HgB A1C (Completed)   Peripheral vascular disease ([place])   Relevant Orders   Ambulatory referral to Vascular Surgery   Hyperpigmentation of skin    New problem. Uncertain etiology at this time. My only concern regarding the patient's area of hyperpigmentation is ischemia from peripheral artery disease. Arranging for her to see her vascular surgeon as soon as possible. She does have decent cap refill but I cannot  appreciate her pulses. Per her last office visit with vascular surgery things appear to be stable.      Community acquired pneumonia of left lower lobe of lung ([place]) - Primary    New problem. X-ray revealed left lower lobe pneumonia. Treating with moxifloxacin.      Relevant Medications   moxifloxacin (AVELOX) 400 MG tablet   Other Relevant Orders   DG Chest 2 View (Completed)     Follow-up: PRN  [place]

## 2016-09-07 NOTE — Progress Notes (Signed)
Pre visit review using our clinic review tool, if applicable. No additional management support is needed unless otherwise documented below in the visit note. 

## 2016-09-20 IMAGING — DX DG CHEST 2V
2 series · 2 of 2 positions shown · non-contrast
Comparison: 05/16/2015

CLINICAL DATA: Shortness of breath, body aches

EXAM:
CHEST  2 VIEW

[chest ap]
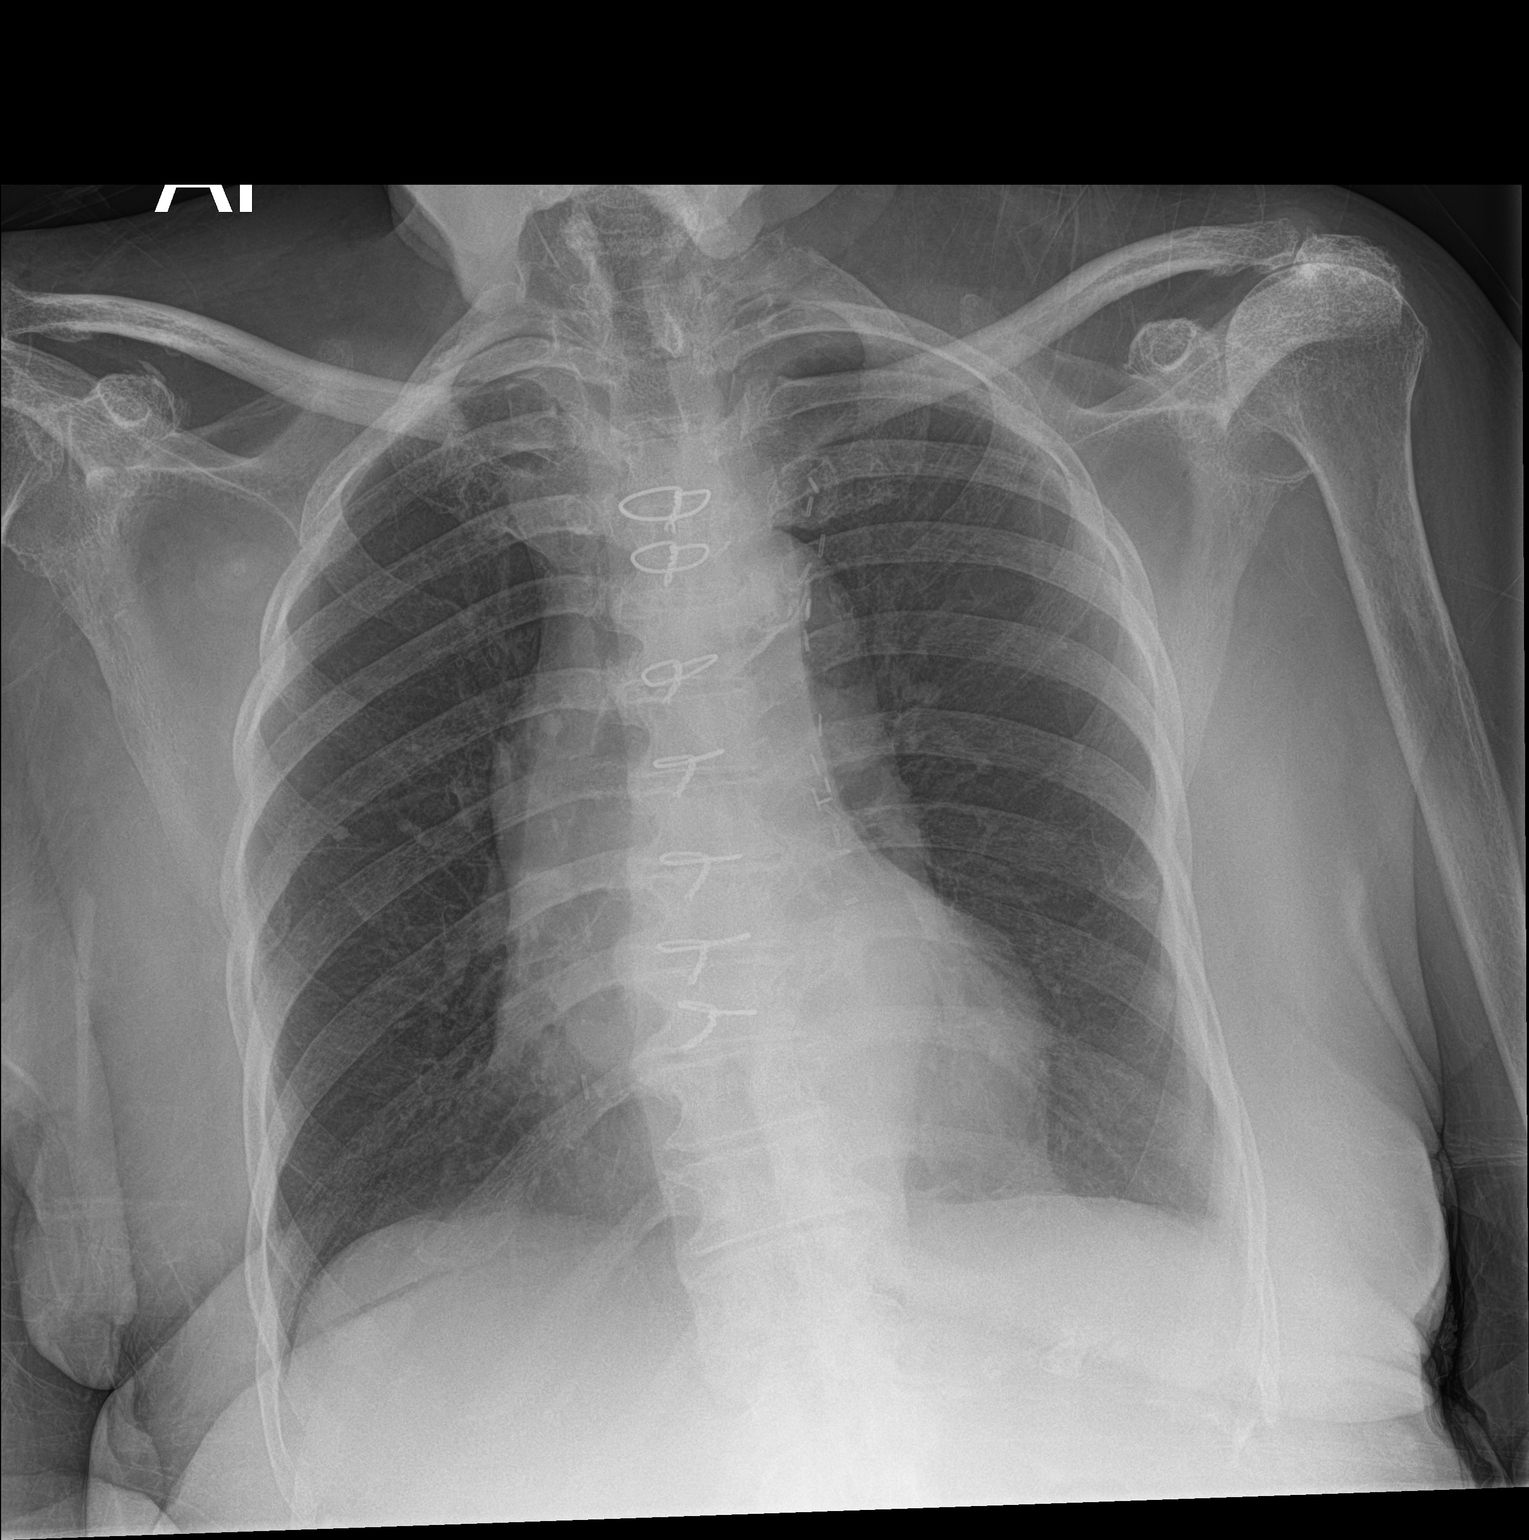

[chest lat]
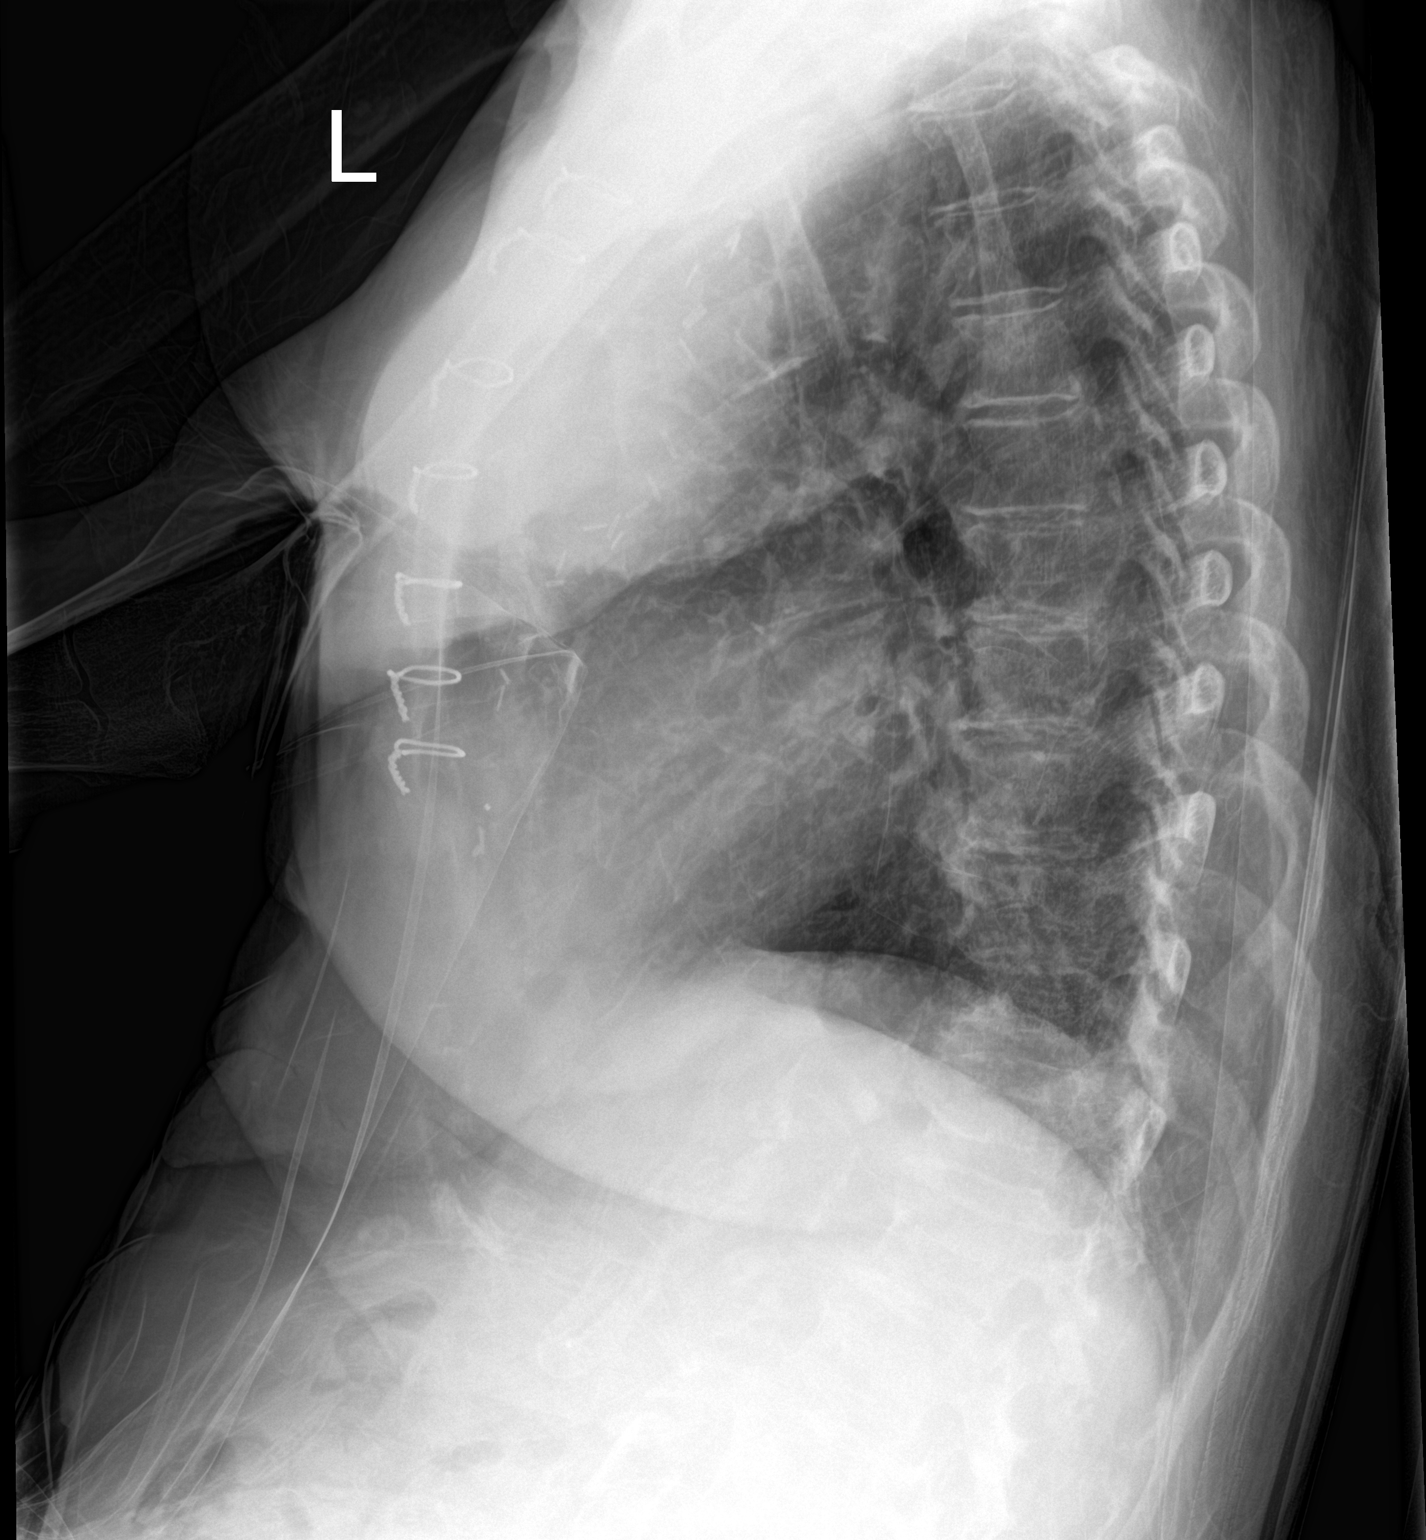

[2 of 2 positions shown; findings below may reference images not displayed]

FINDINGS: Lungs are essentially clear. Chronic 5 mm nodule in the right middle
lung likely reflects a calcified granuloma. No pleural effusion or
pneumothorax.

Cardiomegaly.  Postsurgical changes related to prior CABG.

Degenerative changes of the bilateral shoulders.
IMPRESSION: No evidence of acute cardiopulmonary disease.

## 2016-09-20 IMAGING — MR MR HEAD W/O CM
9 of 11 series · 35 of 48 positions shown · non-contrast
Comparison: 02/18/2015

CLINICAL DATA: History of prior stroke with right-sided deficits.
Worsening right-sided weakness yesterday and today with right-sided
heaviness in tingling intermittently for the past couple of weeks.

EXAM:
MRI HEAD WITHOUT CONTRAST
TECHNIQUE: Multiplanar, multiecho pulse sequences of the brain and surrounding
structures were obtained without intravenous contrast.

[Series 4: DWI · axial · 3.6mm · 0.94mm/px · z∈[-143,-17]mm · 8 of 74 slices shown (1 of 4)]
[im 1/74]
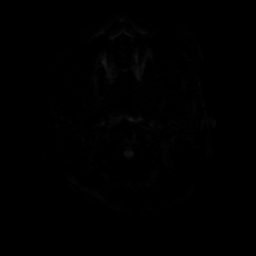
[im 11/74]
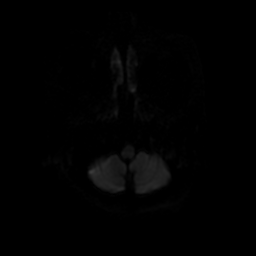
[im 21/74]
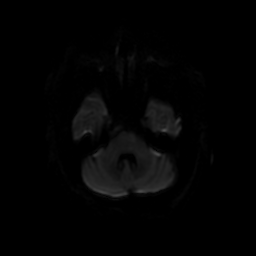
[im 32/74]
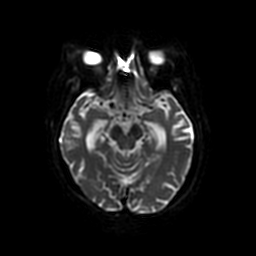
[im 42/74]
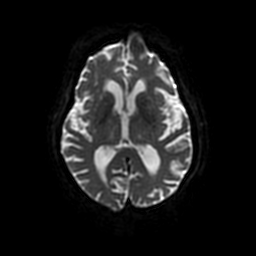
[im 53/74]
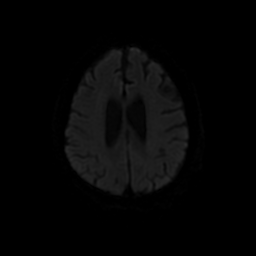
[im 63/74]
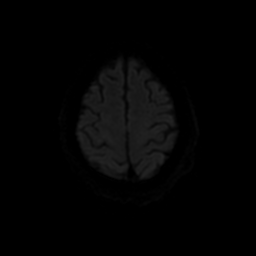
[im 74/74]
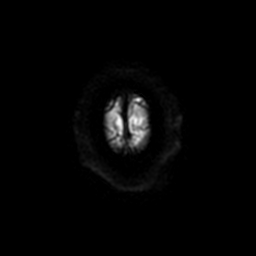

[Series 5: FLAIR · sagittal · 5.0mm · 0.47mm/px · 2 of 21 slices shown (1 of 2)]
[im 1/21]
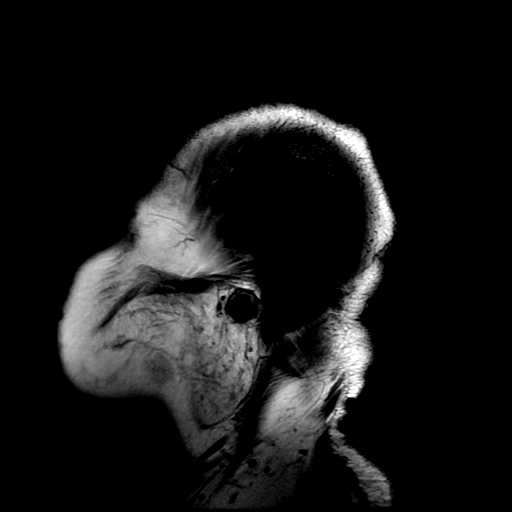
[im 21/21]
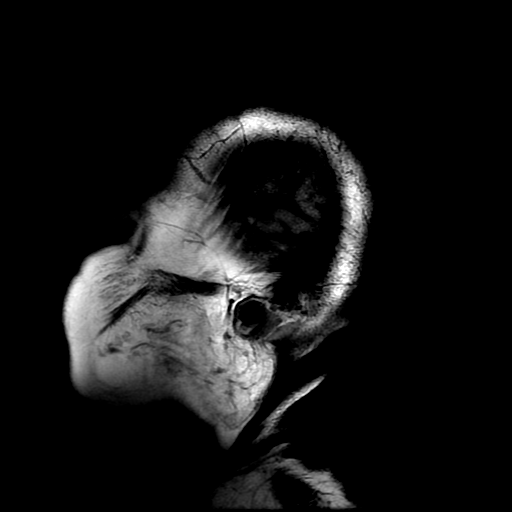

[Series 6: T2 · axial · 5.0mm · 0.47mm/px · z∈[-144,-15]mm · 2 of 23 slices shown (1 of 2)]
[im 1/23]
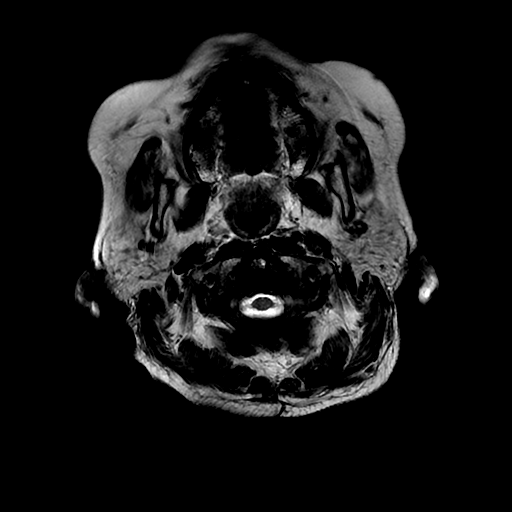
[im 23/23]
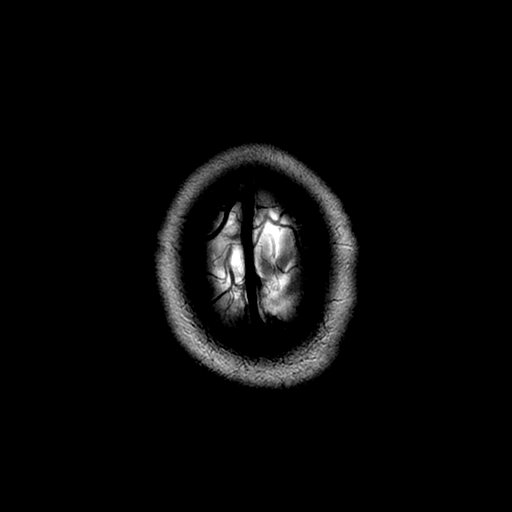

[Series 8: T2 · coronal · 5.0mm · 0.47mm/px · 3 of 27 slices shown (2 of 2)]
[im 1/27]
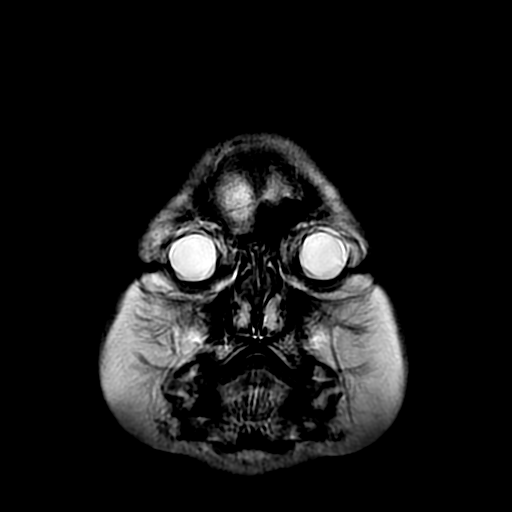
[im 14/27]
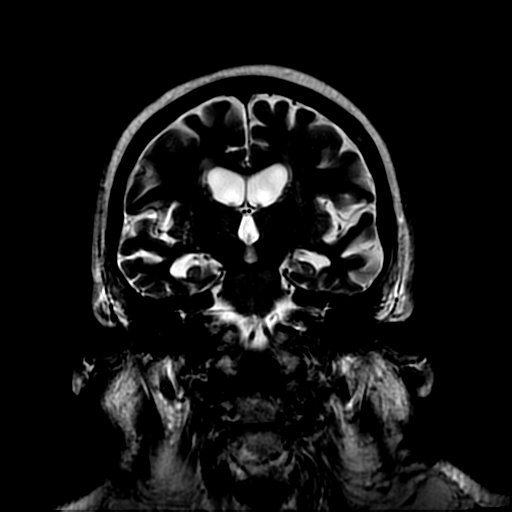
[im 27/27]
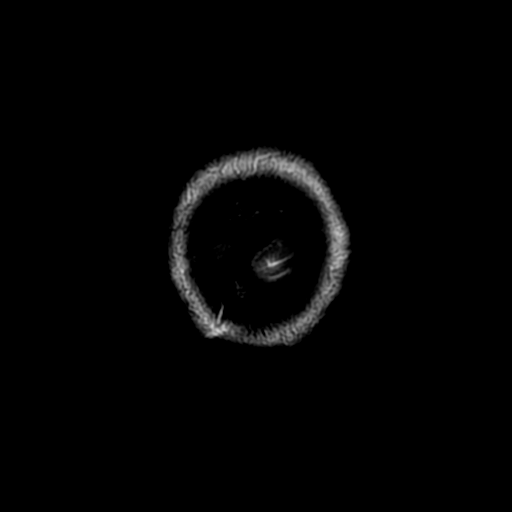

[Series 9: FLAIR · axial · 5.0mm · 0.47mm/px · z∈[-144,-15]mm · 2 of 23 slices shown (2 of 2)]
[im 1/23]
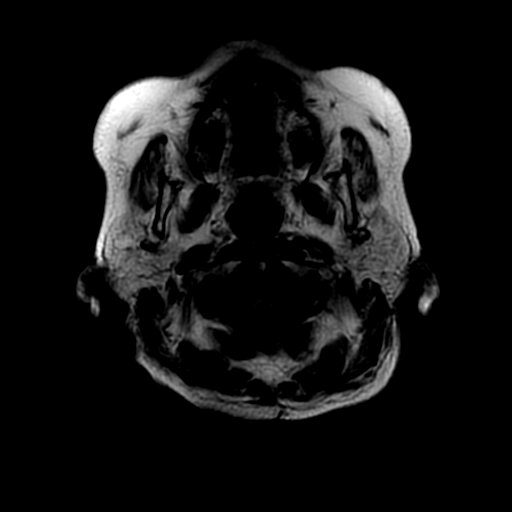
[im 23/23]
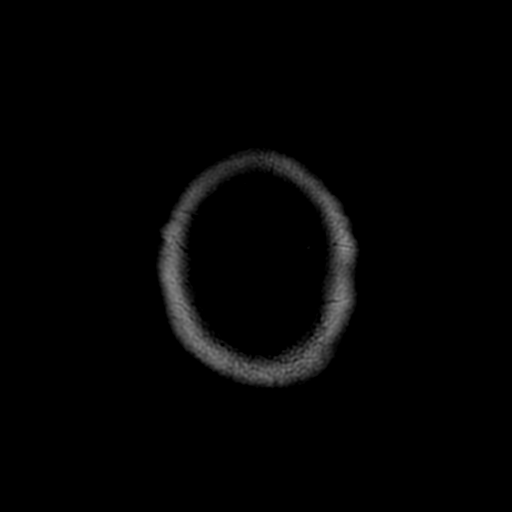

[Series 10: (person_name) · axial · 3.0mm · 0.47mm/px · z∈[-146,-113]mm · 3 of 92 slices shown]
[im 1/92]
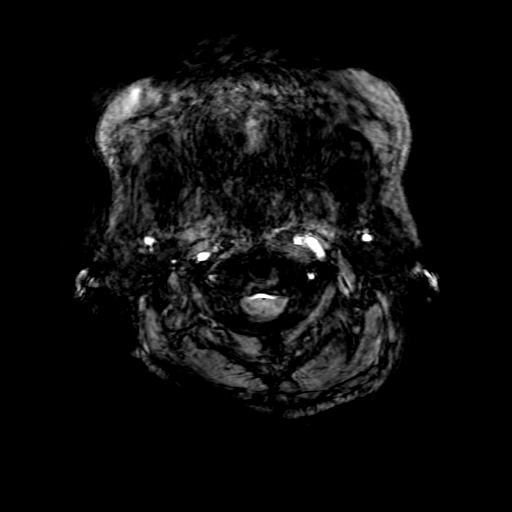
[im 12/92]
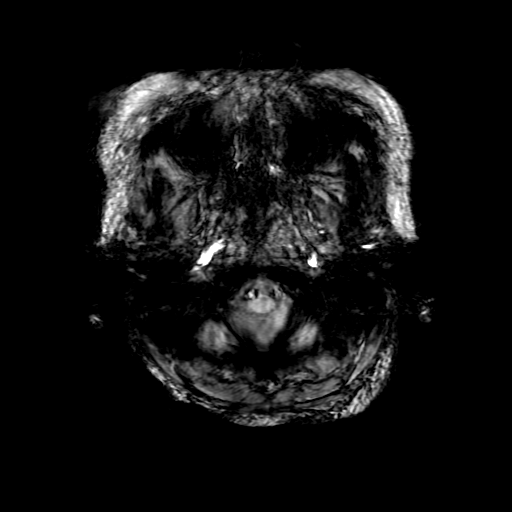
[im 23/92]
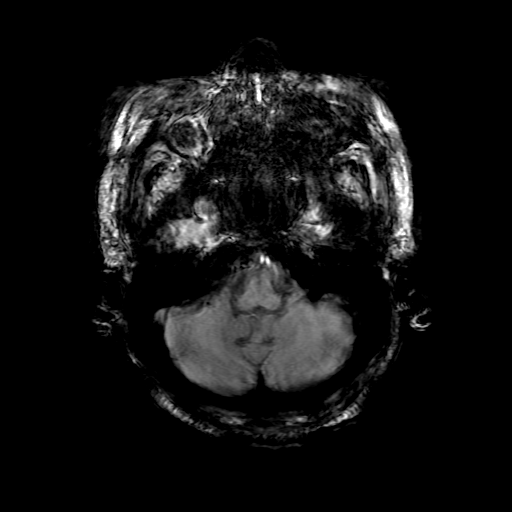

[Series 12: DWI · coronal · 5.0mm · 0.94mm/px · 7 of 72 slices shown (2 of 4)]
[im 1/72]
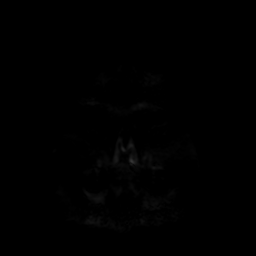
[im 12/72]
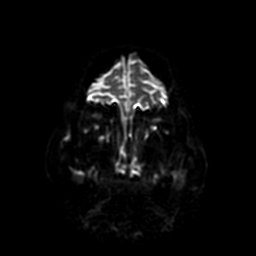
[im 24/72]
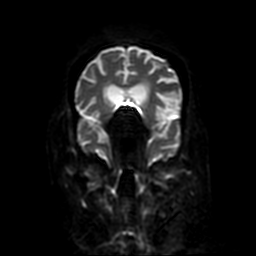
[im 36/72]
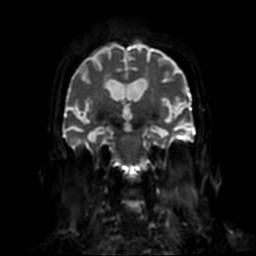
[im 48/72]
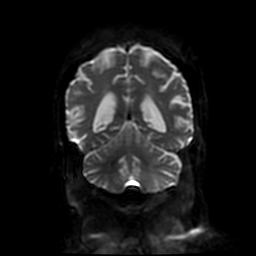
[im 60/72]
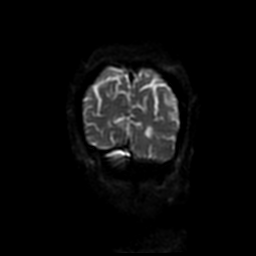
[im 72/72]
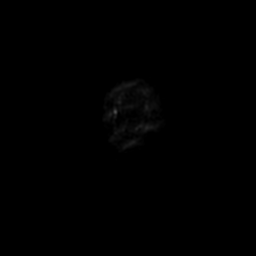

[Series 400: DWI · axial · 3.6mm · 0.94mm/px · z∈[-143,-17]mm · 4 of 37 slices shown (3 of 4)]
[im 1/37]
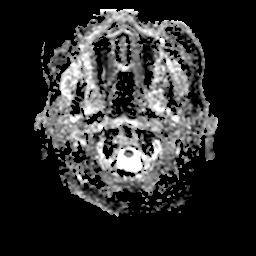
[im 13/37]
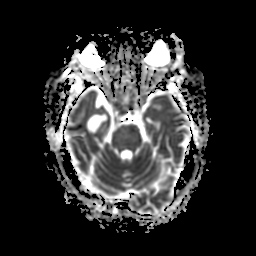
[im 25/37]
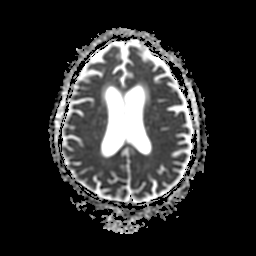
[im 37/37]
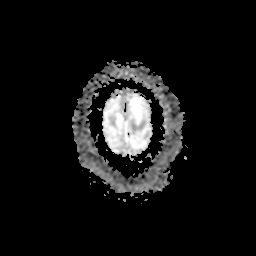

[Series 1200: DWI · coronal · 5.0mm · 0.94mm/px · 4 of 35 slices shown (4 of 4)]
[im 1/35]
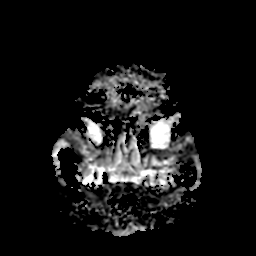
[im 12/35]
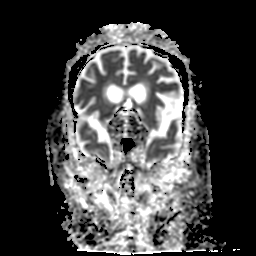
[im 23/35]
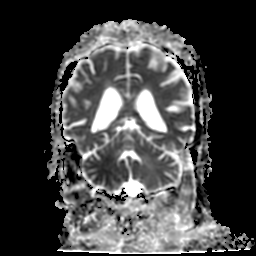
[im 35/35]
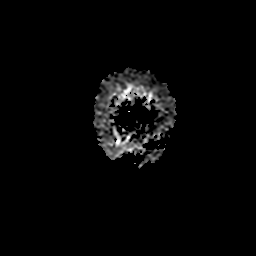

[35 of 48 positions shown; findings below may reference images not displayed]

FINDINGS: Advanced disc degeneration and associated spinal stenosis are again
partially visualized in the cervical spine, incompletely evaluated.

There is no evidence of acute infarct, intracranial hemorrhage,
mass, midline shift, or extra-axial fluid collection. Patchy T2
hyperintensities in the deep cerebral white matter bilaterally and
in the pons are unchanged and compatible with moderate chronic small
vessel ischemic disease. Chronic lacunar infarcts are again seen in
the deep gray nuclei bilaterally, with greatest involvement of the
left thalamus. Subcentimeter cyst or chronic infarct in the upper
right paramedian pons is unchanged. There is mild-to-moderate
cerebral atrophy.

Prior left cataract extraction is noted. The paranasal sinuses are
clear. There are moderate bilateral mastoid effusions. Major
intracranial vascular flow voids are preserved.
IMPRESSION: 1. No acute intracranial abnormality.
2. Moderate chronic small vessel ischemic disease with chronic
lacunar infarcts as above.

## 2016-09-24 ENCOUNTER — Encounter (INDEPENDENT_AMBULATORY_CARE_PROVIDER_SITE_OTHER): Payer: Self-pay | Admitting: Vascular Surgery

## 2016-09-24 ENCOUNTER — Ambulatory Visit (INDEPENDENT_AMBULATORY_CARE_PROVIDER_SITE_OTHER): Payer: Medicare PPO | Admitting: Vascular Surgery

## 2016-09-24 VITALS — BP 115/68 | HR 70 | Resp 16 | Ht 62.0 in | Wt 159.0 lb

## 2016-09-24 DIAGNOSIS — I739 Peripheral vascular disease, unspecified: Secondary | ICD-10-CM

## 2016-09-24 DIAGNOSIS — E1149 Type 2 diabetes mellitus with other diabetic neurological complication: Secondary | ICD-10-CM

## 2016-09-24 DIAGNOSIS — E785 Hyperlipidemia, unspecified: Secondary | ICD-10-CM

## 2016-09-27 ENCOUNTER — Encounter (INDEPENDENT_AMBULATORY_CARE_PROVIDER_SITE_OTHER): Payer: Self-pay

## 2016-09-28 ENCOUNTER — Other Ambulatory Visit (INDEPENDENT_AMBULATORY_CARE_PROVIDER_SITE_OTHER): Payer: Self-pay | Admitting: Vascular Surgery

## 2016-09-28 NOTE — Progress Notes (Signed)
Subjective:    Patient ID: [name], female    DOB: 1921/11/28, 83 y.o.   MRN: [id number] Chief Complaint  Patient presents with  . Re-evaluation    PVD   Patient last seen in 06/2016 for a regular PAD follow up. She presents sooner than her six month follow up complaining of left lower extremity pain and first toe discoloration. She has a remote history of intervention a couple of years ago. The patient was seen with her daughter. Over the last few months, the patent has been experiencing worsening LLE claudication pain. Recently, over the last few weeks the patient and daughter has noticed a darkening of her left first big toe. Patient and daughter are both concerned about the worsening claudication and now the darkening toe. Patient denies any rest pain or ulceration to the foot. She denies any fever, nausea or vomiting.    Review of Systems  Constitutional: Negative.   HENT: Negative.   Eyes: Negative.   Respiratory: Negative.   Cardiovascular:       Left lower extremity pain  Gastrointestinal: Negative.   Endocrine: Negative.   Genitourinary: Negative.   Musculoskeletal: Negative.   Skin:       Darkening tint to left first toe  Allergic/Immunologic: Negative.   Neurological: Negative.   Hematological: Negative.   Psychiatric/Behavioral: Negative.       Objective:   Physical Exam  Constitutional: She is oriented to person, place, and time. She appears well-developed and well-nourished. No distress.  HENT:  Head: Normocephalic and atraumatic.  Eyes: Conjunctivae are normal. Pupils are equal, round, and reactive to light.  Neck: Normal range of motion.  Cardiovascular: Normal rate, regular rhythm and normal heart sounds.   Pulses:      Radial pulses are 2+ on the right side, and 2+ on the left side.       Dorsalis pedis pulses are 1+ on the right side, and 0 on the left side.       Posterior tibial pulses are 1+ on the right side, and 0 on the left side.    Pulmonary/Chest: Effort normal.  Musculoskeletal: Normal range of motion. She exhibits edema (Mild Bilateral Edema).  Neurological: She is alert and oriented to person, place, and time.  Skin: Skin is warm and dry. She is not diaphoretic.  Psychiatric: She has a normal mood and affect. Her behavior is normal. Judgment and thought content normal.  Vitals reviewed.  BP 115/68 (BP Location: Left Arm)   Pulse 70   Resp 16   Ht _0  (1.575 m)   Wt 159 lb (72.1 kg)   BMI 29.08 kg/m   Past Medical History:  Diagnosis Date  . Anxiety state 01/01/2015  . Arthritis    "all over"  . Breast cancer ([place])    Hx of R breast cancer s/p mastectomy; Currently has L breast cancer (Diagnosed 2015).   . Breast cancer, left breast ([place]) 01/01/2015  . CKD (chronic kidney disease), stage III 02/18/2015  . Decubitus ulcer of sacral region, stage 2 01/01/2015  . Decubitus ulcer, buttock   . Diabetes mellitus with insulin therapy ([employer] [facility]) 01/01/2015  . Essential hypertension 01/01/2015  . Glaucoma 01/01/2015  . Glaucoma, bilateral   . History of breast cancer 01/01/2015  . History of DVT (deep vein thrombosis) 1980's   BLE  . HLD (hyperlipidemia) 01/01/2015  . Hyperlipidemia   . Hypertension   . Itchy scalp 02/10/2015  . Osteoarthritis 01/01/2015  . Recurrent UTI 03/06/2015  .  Right knee pain 02/10/2015  . TIA (transient ischemic attack) 02/18/2015  . Type II diabetes mellitus (HCC)    Insulin dependent.    Social History   Social History  . Marital status: Widowed    Spouse name: N/A  . Number of children: N/A  . Years of education: N/A   Occupational History  . Not on file.   Social History Main Topics  . Smoking status: Never Smoker  . Smokeless tobacco: Never Used  . Alcohol use No  . Drug use: No  . Sexual activity: No   Other Topics Concern  . Not on file   Social History Narrative   Lives with daughter/daughter's husband.   Denita Lung [phone]).    Past Surgical History:   Procedure Laterality Date  . ABDOMINAL HYSTERECTOMY    . BREAST BIOPSY Right 1980's   +  . BREAST EXCISIONAL BIOPSY Left 2015   + stereo no surgery  . CHOLECYSTECTOMY    . MASTECTOMY Right 1980's  . MEDIAL PARTIAL KNEE REPLACEMENT Right 1980's?  . PERIPHERAL VASCULAR CATHETERIZATION  04/28/2015   Procedure: Lower Extremity Intervention;  Surgeon: Algernon Huxley, MD;  Location: [facility];  Service: Cardiovascular;;  . PERIPHERAL VASCULAR CATHETERIZATION N/A 04/28/2015   Procedure: Abdominal Aortogram w/Lower Extremity;  Surgeon: Algernon Huxley, MD;  Location: [facility];  Service: Cardiovascular;  Laterality: N/A;  . PERIPHERAL VASCULAR CATHETERIZATION Left 10/20/2015   Procedure: Lower Extremity Angiography;  Surgeon: Algernon Huxley, MD;  Location: [facility];  Service: Cardiovascular;  Laterality: Left;  . PERIPHERAL VASCULAR CATHETERIZATION  10/20/2015   Procedure: Lower Extremity Intervention;  Surgeon: Algernon Huxley, MD;  Location: [facility];  Service: Cardiovascular;;   Family History  Problem Relation Age of Onset  . Heart disease Mother   . Diabetes Son   . Diabetes Son   . Kidney disease Son   . Kidney cancer Neg Hx   . Bladder Cancer Neg Hx   . Prostate cancer Neg Hx    No Known Allergies     Assessment & Plan:  Patient last seen in 06/2016 for a regular PAD follow up. She presents sooner than her six month follow up complaining of left lower extremity pain and first toe discoloration. She has a remote history of intervention a couple of years ago. The patient was seen with her daughter. Over the last few months, the patent has been experiencing worsening LLE claudication pain. Recently, over the last few weeks the patient and daughter has noticed a darkening of her left first big toe. Patient and daughter are both concerned about the worsening claudication and now the darkening toe. Patient denies any rest pain or ulceration to the foot. She  denies any fever, nausea or vomiting.   1. Peripheral vascular disease ([place]) - Worsening Patient with darkening left first toe Progressively worsening left lower extremity pain Unable to appreciate left foot pedal pulses Recommend LLE angiogram in an attempt to revascularize the extremity Procedure, risks and benefits explained to patient. All questions answered. Patient and daughter wish to proceed.  2. DM (diabetes mellitus), type 2 with neurological complications (HCC) - Stable Encouraged good control as its slows the progression of atherosclerotic disease  3. Hyperlipidemia, unspecified hyperlipidemia type - Stable Encouraged good control as its slows the progression of atherosclerotic disease  Current Outpatient Prescriptions on File Prior to Visit  Medication Sig Dispense Refill  . Alcohol Swabs (B-D SINGLE USE SWABS  REGULAR) PADS Use as directed. 100 each 11  . amLODipine (NORVASC) 5 MG tablet TAKE 1 TABLET (5 MG TOTAL) BY MOUTH DAILY. 90 tablet 2  . B-D UF III MINI PEN NEEDLES 31G X 5 MM MISC Use when checking blood sugar. 100 each 11  . bimatoprost (LUMIGAN) 0.01 % SOLN Place 1 drop into both eyes at bedtime. 7.5 mL 0  . blood glucose meter kit and supplies Dispense a Accu Check meter..  E11.29 1 each 0  . brimonidine (ALPHAGAN P) 0.1 % SOLN PLACE 1 DROP INTO BOTH EYES 2 (TWO) TIMES DAILY. 5 mL 1  . Calcium Carb-Cholecalciferol (CALCIUM 600 + D PO) Take 1 tablet by mouth daily.     . Cholecalciferol (VITAMIN D3) 1000 UNITS CAPS Take 1 capsule by mouth daily.     . clopidogrel (PLAVIX) 75 MG tablet Take 1 tablet (75 mg total) by mouth daily. 90 tablet 2  . CRANBERRY PO Take 500 mg by mouth 2 (two) times daily.     Marland Kitchen exemestane (AROMASIN) 25 MG tablet TAKE 1 TABLET EVERY DAY AT BEDTIME 90 tablet 6  . Fluocinolone Acetonide 0.01 % SHAM Apply to scalp once daily; work into [employer] and allow to remain on scalp for ~5 minutes then rinse with water. 120 mL 1  . glucose blood (COOL  BLOOD GLUCOSE TEST STRIPS) test strip Use as instructed 100 each 12  . hydrALAZINE (APRESOLINE) 50 MG tablet TAKE 1 TABLET FOUR TIMES DAILY 270 tablet 2  . ketoconazole (NIZORAL) 2 % shampoo 2 application.    . Lancets (ACCU-CHEK SOFT TOUCH) lancets Use as instructed 100 each 12  . mirtazapine (REMERON) 7.5 MG tablet Take 1 tablet (7.5 mg total) by mouth daily. 90 tablet 0  . moxifloxacin (AVELOX) 400 MG tablet Take 1 tablet (400 mg total) by mouth daily at 8 pm. 7 tablet 0  . Multiple Vitamins-Minerals (MULTIVITAMIN GUMMIES ADULT PO) Take 1 tablet by mouth daily.     Marland Kitchen omeprazole (PRILOSEC) 20 MG capsule Take 1 capsule (20 mg total) by mouth daily. 90 capsule 2  . ranitidine (ZANTAC) 150 MG capsule Take 1 capsule (150 mg total) by mouth 2 (two) times daily. 180 capsule 2   No current facility-administered medications on file prior to visit.    There are no Patient Instructions on file for this visit. No Follow-up on file.  KIMBERLY A STEGMAYER, PA-C

## 2016-09-29 ENCOUNTER — Encounter
Admission: RE | Admit: 2016-09-29 | Discharge: 2016-09-29 | Disposition: A | Discharge status: Home / Self Care | Payer: Medicare PPO | Source: Ambulatory Visit | Attending: Vascular Surgery | Admitting: Vascular Surgery

## 2016-09-29 DIAGNOSIS — Z9049 Acquired absence of other specified parts of digestive tract: Secondary | ICD-10-CM | POA: Diagnosis not present

## 2016-09-29 DIAGNOSIS — N183 Chronic kidney disease, stage 3 (moderate): Secondary | ICD-10-CM | POA: Diagnosis not present

## 2016-09-29 DIAGNOSIS — Z9011 Acquired absence of right breast and nipple: Secondary | ICD-10-CM | POA: Diagnosis not present

## 2016-09-29 DIAGNOSIS — Z86718 Personal history of other venous thrombosis and embolism: Secondary | ICD-10-CM | POA: Diagnosis not present

## 2016-09-29 DIAGNOSIS — Z833 Family history of diabetes mellitus: Secondary | ICD-10-CM | POA: Diagnosis not present

## 2016-09-29 DIAGNOSIS — Z794 Long term (current) use of insulin: Secondary | ICD-10-CM | POA: Diagnosis not present

## 2016-09-29 DIAGNOSIS — Z8673 Personal history of transient ischemic attack (TIA), and cerebral infarction without residual deficits: Secondary | ICD-10-CM | POA: Diagnosis not present

## 2016-09-29 DIAGNOSIS — Z7902 Long term (current) use of antithrombotics/antiplatelets: Secondary | ICD-10-CM | POA: Diagnosis not present

## 2016-09-29 DIAGNOSIS — Z9889 Other specified postprocedural states: Secondary | ICD-10-CM | POA: Diagnosis not present

## 2016-09-29 DIAGNOSIS — E1122 Type 2 diabetes mellitus with diabetic chronic kidney disease: Secondary | ICD-10-CM | POA: Diagnosis not present

## 2016-09-29 DIAGNOSIS — I129 Hypertensive chronic kidney disease with stage 1 through stage 4 chronic kidney disease, or unspecified chronic kidney disease: Secondary | ICD-10-CM | POA: Diagnosis not present

## 2016-09-29 DIAGNOSIS — Z853 Personal history of malignant neoplasm of breast: Secondary | ICD-10-CM | POA: Diagnosis not present

## 2016-09-29 DIAGNOSIS — M1990 Unspecified osteoarthritis, unspecified site: Secondary | ICD-10-CM | POA: Diagnosis not present

## 2016-09-29 DIAGNOSIS — Z8249 Family history of ischemic heart disease and other diseases of the circulatory system: Secondary | ICD-10-CM | POA: Diagnosis not present

## 2016-09-29 DIAGNOSIS — Z90710 Acquired absence of both cervix and uterus: Secondary | ICD-10-CM | POA: Diagnosis not present

## 2016-09-29 DIAGNOSIS — E785 Hyperlipidemia, unspecified: Secondary | ICD-10-CM | POA: Diagnosis not present

## 2016-09-29 DIAGNOSIS — Z87440 Personal history of urinary (tract) infections: Secondary | ICD-10-CM | POA: Diagnosis not present

## 2016-09-29 DIAGNOSIS — H409 Unspecified glaucoma: Secondary | ICD-10-CM | POA: Diagnosis not present

## 2016-09-29 DIAGNOSIS — M810 Age-related osteoporosis without current pathological fracture: Secondary | ICD-10-CM | POA: Diagnosis not present

## 2016-09-29 DIAGNOSIS — I70268 Atherosclerosis of native arteries of extremities with gangrene, other extremity: Secondary | ICD-10-CM | POA: Diagnosis present

## 2016-09-29 LAB — CREATININE, SERUM
Creatinine, Ser: 0.67 mg/dL (ref 0.44–1.00)
GFR calc Af Amer: >60 mL/min (ref >60)
GFR calc non Af Amer: >60 mL/min (ref >60)

## 2016-09-29 LAB — BUN: BUN: 27 mg/dL — AB (ref 6–20)

## 2016-09-30 ENCOUNTER — Encounter
Admission: RE | Disposition: A | Discharge status: Home / Self Care | Payer: Self-pay | Source: Ambulatory Visit | Attending: Vascular Surgery

## 2016-09-30 ENCOUNTER — Ambulatory Visit
Admission: RE | Admit: 2016-09-30 | Discharge: 2016-09-30 | Disposition: A | Discharge status: Home / Self Care | Payer: Medicare PPO | Source: Ambulatory Visit | Attending: Vascular Surgery | Admitting: Vascular Surgery

## 2016-09-30 DIAGNOSIS — Z853 Personal history of malignant neoplasm of breast: Secondary | ICD-10-CM | POA: Insufficient documentation

## 2016-09-30 DIAGNOSIS — M810 Age-related osteoporosis without current pathological fracture: Secondary | ICD-10-CM | POA: Insufficient documentation

## 2016-09-30 DIAGNOSIS — Z86718 Personal history of other venous thrombosis and embolism: Secondary | ICD-10-CM | POA: Insufficient documentation

## 2016-09-30 DIAGNOSIS — E1122 Type 2 diabetes mellitus with diabetic chronic kidney disease: Secondary | ICD-10-CM | POA: Insufficient documentation

## 2016-09-30 DIAGNOSIS — N183 Chronic kidney disease, stage 3 (moderate): Secondary | ICD-10-CM | POA: Insufficient documentation

## 2016-09-30 DIAGNOSIS — Z9889 Other specified postprocedural states: Secondary | ICD-10-CM | POA: Insufficient documentation

## 2016-09-30 DIAGNOSIS — M1990 Unspecified osteoarthritis, unspecified site: Secondary | ICD-10-CM | POA: Insufficient documentation

## 2016-09-30 DIAGNOSIS — I70262 Atherosclerosis of native arteries of extremities with gangrene, left leg: Secondary | ICD-10-CM | POA: Diagnosis not present

## 2016-09-30 DIAGNOSIS — Z9049 Acquired absence of other specified parts of digestive tract: Secondary | ICD-10-CM | POA: Insufficient documentation

## 2016-09-30 DIAGNOSIS — H409 Unspecified glaucoma: Secondary | ICD-10-CM | POA: Insufficient documentation

## 2016-09-30 DIAGNOSIS — Z833 Family history of diabetes mellitus: Secondary | ICD-10-CM | POA: Insufficient documentation

## 2016-09-30 DIAGNOSIS — I129 Hypertensive chronic kidney disease with stage 1 through stage 4 chronic kidney disease, or unspecified chronic kidney disease: Secondary | ICD-10-CM | POA: Insufficient documentation

## 2016-09-30 DIAGNOSIS — I70268 Atherosclerosis of native arteries of extremities with gangrene, other extremity: Secondary | ICD-10-CM | POA: Insufficient documentation

## 2016-09-30 DIAGNOSIS — Z794 Long term (current) use of insulin: Secondary | ICD-10-CM | POA: Insufficient documentation

## 2016-09-30 DIAGNOSIS — Z90710 Acquired absence of both cervix and uterus: Secondary | ICD-10-CM | POA: Insufficient documentation

## 2016-09-30 DIAGNOSIS — Z87440 Personal history of urinary (tract) infections: Secondary | ICD-10-CM | POA: Insufficient documentation

## 2016-09-30 DIAGNOSIS — Z8673 Personal history of transient ischemic attack (TIA), and cerebral infarction without residual deficits: Secondary | ICD-10-CM | POA: Insufficient documentation

## 2016-09-30 DIAGNOSIS — E785 Hyperlipidemia, unspecified: Secondary | ICD-10-CM | POA: Insufficient documentation

## 2016-09-30 DIAGNOSIS — Z9011 Acquired absence of right breast and nipple: Secondary | ICD-10-CM | POA: Insufficient documentation

## 2016-09-30 DIAGNOSIS — Z7902 Long term (current) use of antithrombotics/antiplatelets: Secondary | ICD-10-CM | POA: Insufficient documentation

## 2016-09-30 DIAGNOSIS — Z8249 Family history of ischemic heart disease and other diseases of the circulatory system: Secondary | ICD-10-CM | POA: Insufficient documentation

## 2016-09-30 HISTORY — PX: LOWER EXTREMITY ANGIOGRAPHY: CATH118251

## 2016-09-30 HISTORY — PX: LOWER EXTREMITY INTERVENTION: CATH118252

## 2016-09-30 LAB — GLUCOSE, CAPILLARY: GLUCOSE-CAPILLARY: 167 mg/dL — AB (ref 65–99)

## 2016-09-30 SURGERY — LOWER EXTREMITY ANGIOGRAPHY
Anesthesia: Moderate Sedation | Site: Leg Lower | Laterality: Left

## 2016-09-30 MED ORDER — HEPARIN SODIUM (PORCINE) 1000 UNIT/ML IJ SOLN
INTRAMUSCULAR | Status: AC
  Filled 2016-09-30: qty 1

## 2016-09-30 MED ORDER — FENTANYL CITRATE (PF) 100 MCG/2ML IJ SOLN
INTRAMUSCULAR | Status: DC | PRN
  Administered 2016-09-30: 25 ug via INTRAVENOUS

## 2016-09-30 MED ORDER — NITROGLYCERIN 5 MG/ML IV SOLN
INTRAVENOUS | Status: AC
  Filled 2016-09-30: qty 10

## 2016-09-30 MED ORDER — MIDAZOLAM HCL 2 MG/2ML IJ SOLN
INTRAMUSCULAR | Status: DC | PRN
  Administered 2016-09-30: 1 mg via INTRAVENOUS

## 2016-09-30 MED ORDER — ACETAMINOPHEN 325 MG PO TABS: 325.0000 mg | ORAL_TABLET | ORAL | Status: DC | PRN

## 2016-09-30 MED ORDER — HEPARIN (PORCINE) IN NACL 2-0.9 UNIT/ML-% IJ SOLN
INTRAMUSCULAR | Status: AC
  Filled 2016-09-30: qty 1000

## 2016-09-30 MED ORDER — HEPARIN SODIUM (PORCINE) 1000 UNIT/ML IJ SOLN
INTRAMUSCULAR | Status: DC | PRN
  Administered 2016-09-30: 4000 [IU] via INTRAVENOUS

## 2016-09-30 MED ORDER — ONDANSETRON HCL 4 MG/2ML IJ SOLN: 4.0000 mg | Freq: Four times a day (QID) | INTRAMUSCULAR | Status: DC | PRN

## 2016-09-30 MED ORDER — HYDROMORPHONE HCL 1 MG/ML IJ SOLN: 0.5000 mg | INTRAMUSCULAR | Status: DC | PRN

## 2016-09-30 MED ORDER — PHENOL 1.4 % MT LIQD: 1.0000 | OROMUCOSAL | Status: DC | PRN

## 2016-09-30 MED ORDER — METHYLPREDNISOLONE SODIUM SUCC 125 MG IJ SOLR: 125.0000 mg | INTRAMUSCULAR | Status: DC | PRN

## 2016-09-30 MED ORDER — MIDAZOLAM HCL 2 MG/2ML IJ SOLN
INTRAMUSCULAR | Status: AC
  Filled 2016-09-30: qty 2

## 2016-09-30 MED ORDER — SODIUM CHLORIDE 0.9 % IV SOLN: 500.0000 mL | Freq: Once | INTRAVENOUS | Status: DC | PRN

## 2016-09-30 MED ORDER — FAMOTIDINE 20 MG PO TABS: 40.0000 mg | ORAL_TABLET | ORAL | Status: DC | PRN

## 2016-09-30 MED ORDER — SODIUM CHLORIDE 0.9 % IJ SOLN
INTRAMUSCULAR | Status: AC
  Filled 2016-09-30: qty 50

## 2016-09-30 MED ORDER — ACETAMINOPHEN 325 MG RE SUPP: 325.0000 mg | RECTAL | Status: DC | PRN

## 2016-09-30 MED ORDER — GUAIFENESIN-DM 100-10 MG/5ML PO SYRP: 15.0000 mL | ORAL_SOLUTION | ORAL | Status: DC | PRN

## 2016-09-30 MED ORDER — FENTANYL CITRATE (PF) 100 MCG/2ML IJ SOLN
INTRAMUSCULAR | Status: AC
  Filled 2016-09-30: qty 2

## 2016-09-30 MED ORDER — SODIUM BICARBONATE 8.4 % IV SOLN
INTRAVENOUS | Status: AC
  Filled 2016-09-30: qty 50

## 2016-09-30 MED ORDER — CEFAZOLIN IN D5W 1 GM/50ML IV SOLN
1.0000 g | Freq: Once | INTRAVENOUS | Status: AC
  Administered 2016-09-30: 1 g via INTRAVENOUS

## 2016-09-30 MED ORDER — NITROGLYCERIN 1 MG/10 ML FOR IR/CATH LAB
INTRA_ARTERIAL | Status: DC | PRN
Start: 2016-09-30 — End: 2016-09-30
  Administered 2016-09-30: 500 ug

## 2016-09-30 MED ORDER — LIDOCAINE-EPINEPHRINE (PF) 2 %-1:200000 IJ SOLN
INTRAMUSCULAR | Status: AC
  Filled 2016-09-30: qty 20

## 2016-09-30 MED ORDER — METOPROLOL TARTRATE 5 MG/5ML IV SOLN: 2.0000 mg | INTRAVENOUS | Status: DC | PRN

## 2016-09-30 MED ORDER — OXYCODONE-ACETAMINOPHEN 5-325 MG PO TABS: 1.0000 | ORAL_TABLET | ORAL | Status: DC | PRN

## 2016-09-30 MED ORDER — SODIUM CHLORIDE 0.9 % IV SOLN
INTRAVENOUS | Status: DC
  Administered 2016-09-30: 11:00:00 via INTRAVENOUS

## 2016-09-30 MED ORDER — HYDRALAZINE HCL 20 MG/ML IJ SOLN: 5.0000 mg | INTRAMUSCULAR | Status: DC | PRN

## 2016-09-30 MED ORDER — LABETALOL HCL 5 MG/ML IV SOLN: 10.0000 mg | INTRAVENOUS | Status: DC | PRN

## 2016-09-30 MED ORDER — IOPAMIDOL (ISOVUE-300) INJECTION 61%
INTRAVENOUS | Status: DC | PRN
  Administered 2016-09-30: 70 mL via INTRA_ARTERIAL

## 2016-09-30 SURGICAL SUPPLY — 29 items
BALLN LUTONIX 5X150X130 (BALLOONS) ×4
BALLN ULTRVRSE 2X300X150 (BALLOONS) ×2
BALLN ULTRVRSE 2X300X150 OTW (BALLOONS) ×2
BALLN ULTRVRSE 3X300X150 (BALLOONS) ×2
BALLN ULTRVRSE 3X300X150 OTW (BALLOONS) ×2
BALLN ULTRVRSE 5X22X130 (BALLOONS) ×4
BALLN ULTRVRSE 6X80X75 (BALLOONS) ×4
BALLOON LUTONIX 5X150X130 (BALLOONS) ×2 IMPLANT
BALLOON ULTRVRSE 2X300X150 OTW (BALLOONS) ×2 IMPLANT
BALLOON ULTRVRSE 3X300X150 OTW (BALLOONS) ×2 IMPLANT
BALLOON ULTRVRSE 5X22X130 (BALLOONS) ×2 IMPLANT
BALLOON ULTRVRSE 6X80X75 (BALLOONS) ×2 IMPLANT
CATH 5FR PIG 90CM (CATHETERS) ×4 IMPLANT
CATH BEACON 5.038 65CM KMP-01 (CATHETERS) ×4 IMPLANT
CATH CXI SUPP ANG 2.6FR 150CM (MICROCATHETER) ×4 IMPLANT
CATH CXI SUPP ANG 4FR 135 (MICROCATHETER) ×2 IMPLANT
CATH CXI SUPP ANG 4FR 135CM (MICROCATHETER) ×4
CATH VERT 100CM (CATHETERS) ×4 IMPLANT
DEVICE PRESTO INFLATION (MISCELLANEOUS) ×4 IMPLANT
DEVICE STARCLOSE SE CLOSURE (Vascular Products) ×4 IMPLANT
GLIDEWIRE ADV .035X260CM (WIRE) ×4 IMPLANT
PACK ANGIOGRAPHY (CUSTOM PROCEDURE TRAY) ×4 IMPLANT
SHEATH ANL2 6FRX45 HC (SHEATH) ×4 IMPLANT
SHEATH BRITE TIP 5FRX11 (SHEATH) ×4 IMPLANT
STENT VIABAHN 5X250X120 (Permanent Stent) ×4 IMPLANT
STENT VIABAHN 6X250X120 (Permanent Stent) ×4 IMPLANT
TUBING CONTRAST HIGH PRESS 72 (TUBING) ×4 IMPLANT
WIRE G V18X300CM (WIRE) ×4 IMPLANT
WIRE J 3MM .035X145CM (WIRE) ×4 IMPLANT

## 2016-09-30 NOTE — Op Note (Signed)
[name] Percutaneous Study/Intervention Procedural Note   Date of Surgery: 09/30/2016  Surgeon(s):Caden Fatica   Assistants:none  Pre-operative Diagnosis: PAD with gangrenous changes to the left great toe  Post-operative diagnosis: Same  Procedure(s) Performed: 1. Ultrasound guidance for vascular access right femoral artery 2. Catheter placement into left posterior tibial artery  3. Aortogram and selective left lower extremity angiogram 4. Percutaneous transluminal angioplasty of left posterior tibial artery with 2 mm diameter angioplasty balloon distally and 3 mm diameter angioplasty balloon in the proximal and mid segments 5. Percutaneous transluminal angioplasty of the entire left SFA and femoral to posterior tibial artery bypass graft with 5 mm diameter angioplasty balloon as well as Lutonix drug-coated angioplasty balloon in the proximal SFA  6.  Viabahn covered stent placement to the distal bypass graft in the distal bypass anastomosis into the proximal posterior tibial artery with 5 mm diameter by 25 cm length stent  7.  Viabahn covered stent placement to the SFA just beyond its origin, the proximal bypass anastomosis, and the proximal and mid portions of the bypass graft with 6 mm diameter by 25 cm length stent 8. StarClose closure device right femoral artery  EBL: Minimal  Contrast: 70 cc  Fluoro Time: 21.3 minutes  Moderate Conscious Sedation Time: approximately 70 minutes using 1 mg of Versed and 25 mcg of Fentanyl  Indications: Patient is a 83 y.o.female with dark discoloration of the tip of the left great toe consistent with gangrenous changes as well as Yang in her foot. The patient has noninvasive study showing reduced perfusion and she had a known occlusion of a femoral to distal bypass from previous studies. The patient is brought in for angiography  for further evaluation and potential treatment. Risks and benefits are discussed and informed consent is obtained  Procedure: The patient was identified and appropriate procedural time out was performed. The patient was then placed supine on the table and prepped and draped in the usual sterile fashion.Moderate conscious sedation was administered during a face to face encounter with the patient throughout the procedure with my supervision of the RN administering medicines and monitoring the patient's vital signs, pulse oximetry, telemetry and mental status throughout from the start of the procedure until the patient was taken to the recovery room. Ultrasound was used to evaluate the right common femoral artery. It was patent . A digital ultrasound image was acquired. A Seldinger needle was used to access the right common femoral artery under direct ultrasound guidance and a permanent image was performed. A 0.035 J wire was advanced without resistance and a 5Fr sheath was placed. Pigtail catheter was placed into the aorta and an AP aortogram was performed. This demonstrated normal renal arteries and normal aorta and iliac segments without significant stenosis. I then crossed the aortic bifurcation and advanced to the left femoral head. Selective left lower extremity angiogram was then performed. This demonstrated a flush occlusion of the SFA. Common femoral artery and profunda femoris artery were highly calcific but did not appear to have high-grade stenosis. The previous bypass appeared to be based off of the superficial femoral artery and was occluded. There was very poor distal reconstitution with what only appeared to be a peroneal artery and a diseased popliteal artery on delayed imaging. The patient was systemically heparinized and a 6 [place] [employer] sheath was then placed over the [employer] wire. I then used a Kumpe catheter and the advantage wire to get into the SFA. Many attempts were made to  get into the native SFA to try to recanalize this, but that did not appear possible and it is possible that her previous surgical bypass wouldn't occluded the more distal SFA from being accessed. I decided to go ahead and try to reopen her chronically occluded bypass graft. I was able to cross this with a moderate amount of difficulty with the advantage wire and eventually exchanging for a CXI catheter and a 0.018 wire. I got into the posterior tibial artery which by imaging also appeared chronically occluded. Tediously, I was able to advance the wire into the foot. Once I did this, I started with intervention. A 2 mm diameter by 30 cm length angioplasty balloon was then advanced all the way to the foot and inflated to 10 atm for 1 minute. This came up to the proximal to mid posterior tibial artery. I then used a 3 mm diameter by 30 cm length angioplasty balloon for the mid and proximal posterior tibial artery in the distal bypass anastomosis. I then treated the entirety of the bypass graft from the SFA to the posterior tibial artery in the proximal SFA for the proximal SFA and the proximal bypass anastomosis I used a 5 mm diameter by 15 cm length Lutonix drug-coated angioplasty balloon and for the remainder of the bypass down to the posterior tibial artery and distal anastomosis I used 2 inflations with a 5 mm diameter by 22 cm length angioplasty balloon completion angiogram following this still showed no flow. I then took covered stents to treat the entirety of the bypass graft which was full of chronic thrombus. A 5 mm diameter by 25 cm length covered stent was taken from the posterior tibial artery just beyond what appeared to be the distal bypass anastomosis up to the midportion of the bypass graft. A 6 mm diameter by 25 cm length covered stent was then taken overlapping to the more distal stent in the mid bypass graft up into the native superficial femoral artery about 2 cm beyond its origin. These were  postdilated with a 5 mm balloon and for the proximal portion actually used a 6 mm balloon to post dilate this as well. Completion angiogram showed surprisingly little stenosis in the SFA and bypass, but flow was still incredibly sluggish. I re-ballooned the posterior tibial artery with a 3 mm diameter by 30 cm length angioplasty balloon up to the distal bypass anastomosis. No greater than 20% residual stenosis was identified throughout all the areas of intervention, but the flow remained quite sluggish. At this point, I did not feel there is much more I could do to improve her perfusion, and we'll hope that medical therapy would help keep her perfusion intact. I elected to terminate the procedure. The sheath was removed and StarClose closure device was deployed in the right femoral artery with excellent hemostatic result. The patient was taken to the recovery room in stable condition having tolerated the procedure well.  Findings:  Aortogram: normal renal arteries, normal aorta and iliac arteries Left Lower Extremity: Flush occlusion of the SFA. Common femoral artery and profunda femoris artery were highly calcific but did not appear to have high-grade stenosis. The previous bypass appeared to be based off of the superficial femoral artery and was occluded. There was very poor distal reconstitution with what only appeared to be a peroneal artery and a diseased popliteal artery on delayed imaging.   Disposition: Patient was taken to the recovery room in stable condition having tolerated the procedure well.  Complications:  None  [name] 09/30/2016 2:27 PM   This note was created with Dragon Medical transcription system. Any errors in dictation are purely unintentional.

## 2016-09-30 NOTE — H&P (Signed)
[facility] History & Physical Update  The patient was interviewed and re-examined.  The patient's previous History and Physical has been reviewed and is unchanged.  There is no change in the plan of care. We plan to proceed with the scheduled procedure.  Leotis Pain, MD  09/30/2016, 10:18 AM

## 2016-10-01 ENCOUNTER — Encounter: Payer: Self-pay | Admitting: Vascular Surgery

## 2016-10-07 ENCOUNTER — Ambulatory Visit: Payer: Self-pay | Admitting: Family Medicine

## 2016-10-26 ENCOUNTER — Ambulatory Visit (INDEPENDENT_AMBULATORY_CARE_PROVIDER_SITE_OTHER): Payer: Medicare PPO | Admitting: Vascular Surgery

## 2016-10-26 ENCOUNTER — Encounter (INDEPENDENT_AMBULATORY_CARE_PROVIDER_SITE_OTHER): Payer: Medicare PPO | Admitting: Vascular Surgery

## 2016-10-26 ENCOUNTER — Encounter (INDEPENDENT_AMBULATORY_CARE_PROVIDER_SITE_OTHER): Payer: Self-pay | Admitting: Vascular Surgery

## 2016-10-26 ENCOUNTER — Encounter (INDEPENDENT_AMBULATORY_CARE_PROVIDER_SITE_OTHER): Payer: Medicare PPO

## 2016-10-26 ENCOUNTER — Ambulatory Visit (INDEPENDENT_AMBULATORY_CARE_PROVIDER_SITE_OTHER): Payer: Medicare PPO

## 2016-10-26 VITALS — BP 159/70 | HR 68 | Resp 16 | Ht 64.0 in

## 2016-10-26 DIAGNOSIS — I739 Peripheral vascular disease, unspecified: Secondary | ICD-10-CM | POA: Diagnosis not present

## 2016-10-26 DIAGNOSIS — E1149 Type 2 diabetes mellitus with other diabetic neurological complication: Secondary | ICD-10-CM

## 2016-10-26 DIAGNOSIS — I639 Cerebral infarction, unspecified: Secondary | ICD-10-CM | POA: Diagnosis not present

## 2016-10-26 DIAGNOSIS — I10 Essential (primary) hypertension: Secondary | ICD-10-CM

## 2016-10-26 DIAGNOSIS — E785 Hyperlipidemia, unspecified: Secondary | ICD-10-CM | POA: Diagnosis not present

## 2016-10-26 NOTE — Assessment & Plan Note (Signed)
With residual right-sided weakness. 

## 2016-10-26 NOTE — Assessment & Plan Note (Signed)
Her studies today demonstrate ABIs which are still poor at 0.46 on the left and normal on the right, but good biphasic waveforms down to the foot on the left and triphasic waveforms on the right. The waveforms are better than previous. She has very limited revascularization potential think this is probably about as good as we can do today. She may still have some mild ischemic rest pain, but certainly at this point she does not want to have an amputation and I do not think she needs 1 immediately. I'll plan to see her back in about 3-4 months. She will contact my office with problems in the interim.

## 2016-10-26 NOTE — Progress Notes (Signed)
MRN : [id number]  [name] is a 83 y.o. (Mar 18, 1922) female who presents with chief complaint of  Chief Complaint  Patient presents with  . Routine Post Op    4 week ABI [facility] follow up  .  History of Present Illness: Patient returns today in follow up of PAD. She is still having some pain in her left foot and lower leg, although it is a little bit better than it was previously. She underwent an extensive revascularization which was of her previous bypass and distal runoff which was poor about a month ago. She had no period procedural complications. Her studies today demonstrate ABIs which are still poor at 0.46 on the left and normal on the right, but good biphasic waveforms down to the foot on the left and triphasic waveforms on the right. The waveforms are better than previous.  Current Outpatient Prescriptions  Medication Sig Dispense Refill  . Alcohol Swabs (B-D SINGLE USE SWABS REGULAR) PADS Use as directed. 100 each 11  . amLODipine (NORVASC) 5 MG tablet TAKE 1 TABLET (5 MG TOTAL) BY MOUTH DAILY. 90 tablet 2  . B-D UF III MINI PEN NEEDLES 31G X 5 MM MISC Use when checking blood sugar. 100 each 11  . bimatoprost (LUMIGAN) 0.01 % SOLN Place 1 drop into both eyes at bedtime. 7.5 mL 0  . blood glucose meter kit and supplies Dispense a Accu Check meter..  E11.29 1 each 0  . brimonidine (ALPHAGAN P) 0.1 % SOLN PLACE 1 DROP INTO BOTH EYES 2 (TWO) TIMES DAILY. 5 mL 1  . Calcium Carb-Cholecalciferol (CALCIUM 600 + D PO) Take 1 tablet by mouth daily.     . Cholecalciferol (VITAMIN D3) 1000 UNITS CAPS Take 1 capsule by mouth daily.     . clopidogrel (PLAVIX) 75 MG tablet Take 1 tablet (75 mg total) by mouth daily. 90 tablet 2  . CRANBERRY PO Take 500 mg by mouth 2 (two) times daily.     Marland Kitchen exemestane (AROMASIN) 25 MG tablet TAKE 1 TABLET EVERY DAY AT BEDTIME 90 tablet 6  . Fluocinolone Acetonide 0.01 % SHAM Apply to scalp once daily; work into [employer] and allow to remain on scalp for ~5  minutes then rinse with water. 120 mL 1  . glucose blood (COOL BLOOD GLUCOSE TEST STRIPS) test strip Use as instructed 100 each 12  . hydrALAZINE (APRESOLINE) 50 MG tablet TAKE 1 TABLET FOUR TIMES DAILY 270 tablet 2  . Lancets (ACCU-CHEK SOFT TOUCH) lancets Use as instructed 100 each 12  . mirtazapine (REMERON) 7.5 MG tablet Take 1 tablet (7.5 mg total) by mouth daily. 90 tablet 0  . Multiple Vitamins-Minerals (MULTIVITAMIN GUMMIES ADULT PO) Take 1 tablet by mouth daily.     Marland Kitchen omeprazole (PRILOSEC) 20 MG capsule Take 1 capsule (20 mg total) by mouth daily. 90 capsule 2  . ranitidine (ZANTAC) 150 MG capsule Take 1 capsule (150 mg total) by mouth 2 (two) times daily. 180 capsule 2   No current facility-administered medications for this visit.         Past Medical History:  Diagnosis Date  . Anxiety state 01/01/2015  . Arthritis    "all over"  . Breast cancer ([place])    Hx of R breast cancer s/p mastectomy; Currently has L breast cancer (Diagnosed 2015).   . Breast cancer, left breast ([place]) 01/01/2015  . CKD (chronic kidney disease), stage III 02/18/2015  . Decubitus ulcer of sacral region, stage 2 01/01/2015  .  Decubitus ulcer, buttock   . Diabetes mellitus with insulin therapy ([place]) 01/01/2015  . Essential hypertension 01/01/2015  . Glaucoma 01/01/2015  . Glaucoma, bilateral   . History of breast cancer 01/01/2015  . History of DVT (deep vein thrombosis) 1980's   BLE  . HLD (hyperlipidemia) 01/01/2015  . Hyperlipidemia   . Hypertension   . Itchy scalp 02/10/2015  . Osteoarthritis 01/01/2015  . Recurrent UTI 03/06/2015  . Right knee pain 02/10/2015  . TIA (transient ischemic attack) 02/18/2015  . Type II diabetes mellitus (HCC)    Insulin dependent.          Past Surgical History:  Procedure Laterality Date  . ABDOMINAL HYSTERECTOMY    . BREAST BIOPSY Right 1980's   +  . BREAST EXCISIONAL BIOPSY Left 2015   + stereo no surgery  . CHOLECYSTECTOMY    . MASTECTOMY  Right 1980's  . MEDIAL PARTIAL KNEE REPLACEMENT Right 1980's?  . PERIPHERAL VASCULAR CATHETERIZATION  04/28/2015   Procedure: Lower Extremity Intervention;  Surgeon: Algernon Huxley, MD;  Location: [place] [facility];  Service: Cardiovascular;;  . PERIPHERAL VASCULAR CATHETERIZATION N/A 04/28/2015   Procedure: Abdominal Aortogram w/Lower Extremity;  Surgeon: Algernon Huxley, MD;  Location: [place] [facility];  Service: Cardiovascular;  Laterality: N/A;  . PERIPHERAL VASCULAR CATHETERIZATION Left 10/20/2015   Procedure: Lower Extremity Angiography;  Surgeon: Algernon Huxley, MD;  Location: [place] [facility];  Service: Cardiovascular;  Laterality: Left;  . PERIPHERAL VASCULAR CATHETERIZATION  10/20/2015   Procedure: Lower Extremity Intervention;  Surgeon: Algernon Huxley, MD;  Location: [place] [facility];  Service: Cardiovascular;;    Social History     Social History  Substance Use Topics  . Smoking status: Never Smoker  . Smokeless tobacco: Never Used  . Alcohol use No    Family History      Family History  Problem Relation Age of Onset  . Heart disease Mother   . Diabetes Son   . Diabetes Son   . Kidney disease Son   . Kidney cancer Neg Hx   . Bladder Cancer Neg Hx   . Prostate cancer Neg Hx     No Known Allergies   REVIEW OF SYSTEMS (Negative unless checked)  Constitutional: [] Weight loss  [] Fever  [] Chills Cardiac: [] Chest pain   [] Chest pressure   [] Palpitations   [] Shortness of breath when laying flat   [] Shortness of breath at rest   [] Shortness of breath with exertion. Vascular:  [] Pain in legs with walking   [] Pain in legs at rest   [] Pain in legs when laying flat   [] Claudication   [] Pain in feet when walking  [] Pain in feet at rest  [] Pain in feet when laying flat   [] History of DVT   [] Phlebitis   [] Swelling in legs   [] Varicose veins   [] Non-healing ulcers Pulmonary:   [] Uses home oxygen   [] Productive cough   [] Hemoptysis   [] Wheeze  [] COPD    [] Asthma Neurologic:  [] Dizziness  [] Blackouts   [] Seizures   [x] History of stroke   [] History of TIA  [] Aphasia   [] Temporary blindness   [] Dysphagia   [x] Weakness or numbness in arms   [x] Weakness or numbness in legs Musculoskeletal:  [] Arthritis   [] Joint swelling   [] Joint pain   [] Low back pain Hematologic:  [] Easy bruising  [] Easy bleeding   [] Hypercoagulable state   [] Anemic   Gastrointestinal:  [] Blood in stool   [] Vomiting blood  [] Gastroesophageal reflux/heartburn   []   Abdominal pain Genitourinary:  [x] Chronic kidney disease   [] Difficult urination  [] Frequent urination  [] Burning with urination   [] Hematuria Skin:  [] Rashes   [] Ulcers   [] Wounds Psychological:  [] History of anxiety   []  History of major depression.    Physical Examination  BP (!) 159/70 (BP Location: Left Arm)   Pulse 68   Resp 16   Ht 5' 4"  (1.626 m)  Gen:  WD/WN, NAD Head: [place]/AT, No temporalis wasting. Ear/Nose/Throat: Hearing grossly intact, nares w/o erythema or drainage, trachea midline Eyes: Conjunctiva clear. Sclera non-icteric Neck: Supple.  No JVD.  Pulmonary:  Good air movement, no use of accessory muscles.  Cardiac: RRR, normal S1, S2 Vascular:  Vessel Right Left  Radial Palpable Palpable  Ulnar Palpable Palpable  Brachial Palpable Palpable  Carotid Palpable, without bruit Palpable, without bruit  Aorta Not palpable N/A  Femoral Palpable Palpable  Popliteal Palpable Not Palpable  PT 1+ Palpable 1+ Palpable  DP Palpable Trace Palpable   Gastrointestinal: soft, non-tender/non-distended.  Musculoskeletal: M/S 5/5 throughout.  Right arm and hand contracture. Uses a wheelchair. Neurologic: Sensation grossly intact in extremities.  Symmetrical.  Speech is fluent.  Psychiatric: Judgment intact, Mood & affect appropriate for pt's clinical situation. Dermatologic: No rashes or ulcers noted.  No cellulitis or open wounds.       Labs Recent Results (from the past 2160 hour(s))  POCT HgB A1C      Status: Abnormal   Collection Time: 09/07/16  2:58 PM  Result Value Ref Range   Hemoglobin A1C 6.0     Comment: machine gave error code Hemoglobin below 6.0g/dl  BUN     Status: Abnormal   Collection Time: 09/29/16 11:33 AM  Result Value Ref Range   BUN 27 (H) 6 - 20 mg/dL  Creatinine, serum     Status: None   Collection Time: 09/29/16 11:33 AM  Result Value Ref Range   Creatinine, Ser 0.67 0.44 - 1.00 mg/dL   GFR calc non Af Amer >60 >60 mL/min   GFR calc Af Amer >60 >60 mL/min    Comment: (NOTE) The eGFR has been calculated using the CKD EPI equation. This calculation has not been validated in all clinical situations. eGFR's persistently <60 mL/min signify possible Chronic Kidney Disease.   Glucose, capillary     Status: Abnormal   Collection Time: 09/30/16 10:25 AM  Result Value Ref Range   Glucose-Capillary 167 (H) 65 - 99 mg/dL    Radiology No results found.    Assessment/Plan  DM (diabetes mellitus), type 2 with neurological complications (HCC) blood glucose control important in reducing the progression of atherosclerotic disease. Also, involved in wound healing. On appropriate medications.   Essential hypertension blood pressure control important in reducing the progression of atherosclerotic disease. On appropriate oral medications.  Stroke [facility]) With residual right-sided weakness  Peripheral vascular disease ([place]) Her studies today demonstrate ABIs which are still poor at 0.46 on the left and normal on the right, but good biphasic waveforms down to the foot on the left and triphasic waveforms on the right. The waveforms are better than previous. She has very limited revascularization potential think this is probably about as good as we can do today. She may still have some mild ischemic rest pain, but certainly at this point she does not want to have an amputation and I do not think she needs 1 immediately. I'll plan to see her back in about 3-4 months.  She will contact my office with  problems in the interim.    Leotis Pain, MD  10/26/2016 10:45 AM    This note was created with Dragon medical transcription system.  Any errors from dictation are purely unintentional

## 2016-11-02 ENCOUNTER — Encounter (INDEPENDENT_AMBULATORY_CARE_PROVIDER_SITE_OTHER): Payer: Medicare PPO

## 2016-11-02 ENCOUNTER — Ambulatory Visit (INDEPENDENT_AMBULATORY_CARE_PROVIDER_SITE_OTHER): Payer: Medicare PPO | Admitting: Vascular Surgery

## 2016-12-01 ENCOUNTER — Other Ambulatory Visit: Payer: Self-pay | Admitting: Family Medicine

## 2017-01-05 ENCOUNTER — Ambulatory Visit (INDEPENDENT_AMBULATORY_CARE_PROVIDER_SITE_OTHER): Payer: Medicare PPO | Admitting: Vascular Surgery

## 2017-01-05 ENCOUNTER — Encounter (INDEPENDENT_AMBULATORY_CARE_PROVIDER_SITE_OTHER): Payer: Medicare PPO

## 2017-01-20 ENCOUNTER — Encounter: Payer: Self-pay | Admitting: Family Medicine

## 2017-01-21 NOTE — Telephone Encounter (Signed)
Pharmacy didn't see refill for Cyclobenzaprine, please advise, thanks

## 2017-01-24 NOTE — Progress Notes (Signed)
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  INTERVAL HISTORY:  Patient returns to clinic for routine 6 month follow up. She has chronic weakness and fatigue, but otherwise feels well. She continues to tolerate Aromasin without significant side effects. She has no neurologic complaints.  She denies any recent fevers or illnesses. She denies any chest pain or shortness of breath.  She has no nausea, vomiting, constipation, or diarrhea. She has no urinary complaints.  Patient offers no further specific complaints.   REVIEW OF SYSTEMS:   Review of Systems  Constitutional: Positive for malaise/fatigue. Negative for fever and weight loss.  Respiratory: Negative.  Negative for cough and shortness of breath.   Cardiovascular: Negative.  Negative for chest pain and leg swelling.  Gastrointestinal: Negative.  Negative for abdominal pain.  Genitourinary: Negative.   Musculoskeletal: Negative.   Neurological: Positive for weakness. Negative for sensory change.  Psychiatric/Behavioral: Negative.  The patient is not nervous/anxious.     As per HPI. Otherwise, a complete review of systems is negative.  PAST MEDICAL HISTORY: Past Medical History:  Diagnosis Date  . Anxiety state 01/01/2015  . Arthritis    "all over"  . Breast cancer ([place])    Hx of R breast cancer s/p mastectomy; Currently has L breast cancer (Diagnosed 2015).   . Breast cancer, left breast ([place]) 01/01/2015  . CKD (chronic kidney disease), stage III 02/18/2015  . Decubitus ulcer of sacral region, stage 2 01/01/2015  . Decubitus ulcer, buttock   . Diabetes mellitus with insulin therapy ([place]) 01/01/2015  . Essential hypertension 01/01/2015  . Glaucoma 01/01/2015  .  Glaucoma, bilateral   . History of breast cancer 01/01/2015  . History of DVT (deep vein thrombosis) 1980's   BLE  . HLD (hyperlipidemia) 01/01/2015  . Hyperlipidemia   . Hypertension   . Itchy scalp 02/10/2015  . Osteoarthritis 01/01/2015  . Recurrent UTI 03/06/2015  . Right knee pain 02/10/2015  . TIA (transient ischemic attack) 02/18/2015  . Type II diabetes mellitus (HCC)    Insulin dependent.     PAST SURGICAL HISTORY: Past Surgical History:  Procedure Laterality Date  . ABDOMINAL HYSTERECTOMY    . BREAST BIOPSY Right 1980's   +  . BREAST EXCISIONAL BIOPSY Left 2015   + stereo no surgery  . CHOLECYSTECTOMY    . LOWER EXTREMITY ANGIOGRAPHY Left 09/30/2016   Procedure: Lower Extremity Angiography;  Surgeon: Algernon Huxley, MD;  Location: [facility];  Service: Cardiovascular;  Laterality: Left;  . LOWER EXTREMITY INTERVENTION  09/30/2016   Procedure: Lower Extremity Intervention;  Surgeon: Algernon Huxley, MD;  Location: [facility] [facility];  Service: Cardiovascular;;  . MASTECTOMY Right 1980's  . MEDIAL PARTIAL KNEE REPLACEMENT Right 1980's?  . PERIPHERAL VASCULAR CATHETERIZATION  04/28/2015   Procedure: Lower Extremity Intervention;  Surgeon: Algernon Huxley, MD;  Location: [facility];  Service: Cardiovascular;;  . PERIPHERAL VASCULAR CATHETERIZATION N/A 04/28/2015   Procedure: Abdominal Aortogram w/Lower Extremity;  Surgeon: Algernon Huxley, MD;  Location: [place] [facility];  Service: Cardiovascular;  Laterality: N/A;  . PERIPHERAL VASCULAR CATHETERIZATION Left 10/20/2015   Procedure: Lower Extremity Angiography;  Surgeon: Algernon Huxley, MD;  [facility];  Service: Cardiovascular;  Laterality: Left;  . PERIPHERAL VASCULAR CATHETERIZATION  10/20/2015   Procedure: Lower Extremity Intervention;  Surgeon: Algernon Huxley, MD;  Location: [place];  Service: Cardiovascular;;    FAMILY HISTORY Family History  Problem Relation Age of Onset  . Heart disease  Mother   . Diabetes Son   . Diabetes Son   . Kidney disease Son   . Kidney cancer Neg Hx   . Bladder Cancer Neg Hx   . Prostate cancer Neg Hx     GYNECOLOGIC HISTORY:  No LMP recorded. Patient has had a hysterectomy.     ADVANCED DIRECTIVES:    HEALTH MAINTENANCE: Social History  Substance Use Topics  . Smoking status: Never Smoker  . Smokeless tobacco: Never Used  . Alcohol use No    No Known Allergies  Current Outpatient Prescriptions  Medication Sig Dispense Refill  . Alcohol Swabs (B-D SINGLE USE SWABS REGULAR) PADS Use as directed. 100 each 11  . amLODipine (NORVASC) 5 MG tablet TAKE 1 TABLET EVERY DAY 90 tablet 2  . aspirin 81 MG EC tablet Take 81 mg by mouth daily. Takes at noon    . B-D UF III MINI PEN NEEDLES 31G X 5 MM MISC Use when checking blood sugar. 100 each 11  . blood glucose meter kit and supplies Dispense a Accu Check meter..  E11.29 1 each 0  . brimonidine (ALPHAGAN P) 0.1 % SOLN PLACE 1 DROP INTO BOTH EYES 2 (TWO) TIMES DAILY. 5 mL 1  . Calcium Carb-Cholecalciferol (CALCIUM 600 + D PO) Take 1 tablet by mouth daily.     . Cholecalciferol (VITAMIN D3) 1000 UNITS CAPS Take 1 capsule by mouth daily.     . clopidogrel (PLAVIX) 75 MG tablet TAKE 1 TABLET EVERY DAY 90 tablet 2  . COMBIGAN 0.2-0.5 % ophthalmic solution     . CRANBERRY PO Take 500 mg by mouth 2 (two) times daily.     Marland Kitchen exemestane (AROMASIN) 25 MG tablet TAKE 1 TABLET EVERY DAY AT BEDTIME 90 tablet 6  . Fluocinolone Acetonide 0.01 % SHAM Apply to scalp once daily; work into [employer] and allow to remain on scalp for ~5 minutes then rinse with water. 120 mL 1  . glucose blood (COOL BLOOD GLUCOSE TEST STRIPS) test strip Use as instructed 100 each 12  . hydrALAZINE (APRESOLINE) 50 MG tablet TAKE 1 TABLET FOUR TIMES DAILY 360 tablet 2  . ketoconazole (NIZORAL) 2 % shampoo 2 application.    . Lancets (ACCU-CHEK SOFT TOUCH) lancets Use as instructed 100 each 12  . LUMIGAN 0.01 % SOLN PLACE 1 DROP INTO  BOTH EYES AT BEDTIME. 3 Bottle 0  . mirtazapine (REMERON) 7.5 MG tablet Take 1 tablet (7.5 mg total) by mouth daily. 90 tablet 0  . moxifloxacin (AVELOX) 400 MG tablet Take 1 tablet (400 mg total) by mouth daily at 8 pm. 7 tablet 0  . Multiple Vitamins-Minerals (MULTIVITAMIN GUMMIES ADULT PO) Take 1 tablet by mouth daily.     Marland Kitchen omeprazole (PRILOSEC) 20 MG capsule TAKE 1 CAPSULE EVERY DAY 90 capsule 2  . ranitidine (ZANTAC) 150 MG capsule TAKE 1 CAPSULE TWICE DAILY 180 capsule 2   No current facility-administered medications for this visit.     OBJECTIVE: BP (!) 148/66 (BP Location: Left Arm, Patient Position: Sitting)   Temp 97.8 F (36.6 C) (Tympanic)   Resp 18    There is no height or weight on file to calculate  BMI.    ECOG FS:1 - Symptomatic but completely ambulatory  General: Well-developed, well-nourished, no acute distress. Sitting in wheelchair. Eyes: Pink conjunctiva, anicteric sclera. Lungs: Clear to auscultation bilaterally. Heart: Irregular rate and rhythm. No rubs, murmurs, or gallops. Abdomen: Soft, nontender, nondistended. No organomegaly noted, normoactive bowel sounds. Breast: Right chest without evidence of recurrence. Musculoskeletal: No edema, cyanosis, or clubbing. Neuro: Alert, answering all questions appropriately. Cranial nerves grossly intact. Skin: No rashes or petechiae noted. Psych: Normal affect.  LAB RESULTS:  No visits with results within 3 Day(s) from this visit.  Latest known visit with results is:  Admission on 09/30/2016, Discharged on 09/30/2016  Component Date Value Ref Range Status  . Glucose-Capillary 09/30/2016 167* 65 - 99 mg/dL Final    STUDIES: No results found.  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  PLAN:    1. 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 At the time of diagnosis it was determined that given patient's advanced age and performance status she should not undergo surgery or XRT. Continue Aromasin for at least 5 years through October 2020.  Her most recent Mammogram on March 25, 2016 was reports at as BI-RADS 6, but improved since her previous mammogram 1 year prior. Repeat in October 2018. Return to clinic in 6 months for further evaluation.  Approximately 20 minutes was spent in discussion of which greater than 50% was consultation.  Patient expressed understanding and was in agreement with this plan. She also understands that She can call clinic at any time with any questions, concerns, or complaints.    Lloyd Huger, MD   01/28/2017 11:28 AM

## 2017-01-26 ENCOUNTER — Inpatient Hospital Stay: Payer: Medicare PPO | Attending: Oncology | Admitting: Oncology

## 2017-01-26 VITALS — BP 148/66 | Temp 97.8°F | Resp 18

## 2017-01-26 DIAGNOSIS — Z7982 Long term (current) use of aspirin: Secondary | ICD-10-CM | POA: Diagnosis not present

## 2017-01-26 DIAGNOSIS — E785 Hyperlipidemia, unspecified: Secondary | ICD-10-CM | POA: Diagnosis not present

## 2017-01-26 DIAGNOSIS — H409 Unspecified glaucoma: Secondary | ICD-10-CM | POA: Diagnosis not present

## 2017-01-26 DIAGNOSIS — Z79899 Other long term (current) drug therapy: Secondary | ICD-10-CM | POA: Diagnosis not present

## 2017-01-26 DIAGNOSIS — N183 Chronic kidney disease, stage 3 (moderate): Secondary | ICD-10-CM | POA: Insufficient documentation

## 2017-01-26 DIAGNOSIS — Z87440 Personal history of urinary (tract) infections: Secondary | ICD-10-CM | POA: Diagnosis not present

## 2017-01-26 DIAGNOSIS — I129 Hypertensive chronic kidney disease with stage 1 through stage 4 chronic kidney disease, or unspecified chronic kidney disease: Secondary | ICD-10-CM | POA: Diagnosis not present

## 2017-01-26 DIAGNOSIS — Z170 Estrogen receptor positive status [ER+]: Secondary | ICD-10-CM | POA: Diagnosis not present

## 2017-01-26 DIAGNOSIS — Z8673 Personal history of transient ischemic attack (TIA), and cerebral infarction without residual deficits: Secondary | ICD-10-CM | POA: Diagnosis not present

## 2017-01-26 DIAGNOSIS — Z794 Long term (current) use of insulin: Secondary | ICD-10-CM | POA: Diagnosis not present

## 2017-01-26 DIAGNOSIS — M1990 Unspecified osteoarthritis, unspecified site: Secondary | ICD-10-CM | POA: Insufficient documentation

## 2017-01-26 DIAGNOSIS — E1122 Type 2 diabetes mellitus with diabetic chronic kidney disease: Secondary | ICD-10-CM | POA: Insufficient documentation

## 2017-01-26 DIAGNOSIS — Z79811 Long term (current) use of aromatase inhibitors: Secondary | ICD-10-CM | POA: Insufficient documentation

## 2017-01-26 DIAGNOSIS — Z86718 Personal history of other venous thrombosis and embolism: Secondary | ICD-10-CM | POA: Insufficient documentation

## 2017-01-26 DIAGNOSIS — C50512 Malignant neoplasm of lower-outer quadrant of left female breast: Secondary | ICD-10-CM | POA: Diagnosis not present

## 2017-01-26 NOTE — Progress Notes (Signed)
Patient is here today for follow up she is doing well no complaints  

## 2017-01-31 ENCOUNTER — Ambulatory Visit: Payer: Self-pay | Admitting: Family Medicine

## 2017-02-01 ENCOUNTER — Other Ambulatory Visit: Payer: Self-pay | Admitting: Family Medicine

## 2017-02-01 MED ORDER — CEPHALEXIN 500 MG PO CAPS: 500.0000 mg | ORAL_CAPSULE | Freq: Two times a day (BID) | ORAL | 0 refills | Status: DC

## 2017-02-07 ENCOUNTER — Other Ambulatory Visit: Payer: Self-pay | Admitting: Family Medicine

## 2017-02-08 ENCOUNTER — Encounter: Payer: Self-pay | Admitting: Family Medicine

## 2017-02-15 ENCOUNTER — Ambulatory Visit (INDEPENDENT_AMBULATORY_CARE_PROVIDER_SITE_OTHER): Payer: Medicare PPO | Admitting: Vascular Surgery

## 2017-02-15 ENCOUNTER — Ambulatory Visit (INDEPENDENT_AMBULATORY_CARE_PROVIDER_SITE_OTHER): Payer: Medicare PPO

## 2017-02-15 ENCOUNTER — Encounter (INDEPENDENT_AMBULATORY_CARE_PROVIDER_SITE_OTHER): Payer: Self-pay | Admitting: Vascular Surgery

## 2017-02-15 VITALS — Resp 14 | Ht 61.0 in | Wt 152.0 lb

## 2017-02-15 DIAGNOSIS — I639 Cerebral infarction, unspecified: Secondary | ICD-10-CM | POA: Diagnosis not present

## 2017-02-15 DIAGNOSIS — I739 Peripheral vascular disease, unspecified: Secondary | ICD-10-CM | POA: Diagnosis not present

## 2017-02-15 DIAGNOSIS — I7025 Atherosclerosis of native arteries of other extremities with ulceration: Secondary | ICD-10-CM | POA: Diagnosis not present

## 2017-02-15 DIAGNOSIS — E1149 Type 2 diabetes mellitus with other diabetic neurological complication: Secondary | ICD-10-CM

## 2017-02-15 DIAGNOSIS — I10 Essential (primary) hypertension: Secondary | ICD-10-CM

## 2017-02-15 NOTE — Assessment & Plan Note (Signed)
She has non-reconstructable vascular disease on the left lower extremity but her ABI today is a little better at 0.56 on the left. Previously this was 0.46. Her right ABI is noncompressible but her waveforms are good and her digit pressure is normal. This ulceration clearly represents a critical and limb threatening situation, but I do not think she has reasonable options for revascularization at this point. I would continue local wound care on the wound. Fortunately her left leg is not hurting that much and I do not think her right leg pain is secondary to perfusion. We will plan to recheck her ABIs in about 6 months. If things worsen, they will contact our office in the interim.

## 2017-02-15 NOTE — Progress Notes (Signed)
MRN : [id number]  [name] is a 83 y.o. (08/26/1921) female who presents with chief complaint of  Chief Complaint  Patient presents with  . Follow-up    3-4 follow up  .  History of Present Illness: Patient returns today in follow up of PAD. She has a chronic ulceration of the left medial ankle which is reasonably stable but has not significantly improved over the past couple of months. She has non-reconstructable vascular disease on the left lower extremity but her ABI today is a little better at 0.56 on the left. Previously this was 0.46. Her right ABI is noncompressible but her waveforms are good and her digit pressure is normal. She complains of pain from the knee down more in the right leg than the left leg. This was the side affected by her stroke.         Current Outpatient Prescriptions  Medication Sig Dispense Refill  . Alcohol Swabs (B-D SINGLE USE SWABS REGULAR) PADS Use as directed. 100 each 11  . amLODipine (NORVASC) 5 MG tablet TAKE 1 TABLET (5 MG TOTAL) BY MOUTH DAILY. 90 tablet 2  . B-D UF III MINI PEN NEEDLES 31G X 5 MM MISC Use when checking blood sugar. 100 each 11  . bimatoprost (LUMIGAN) 0.01 % SOLN Place 1 drop into both eyes at bedtime. 7.5 mL 0  . blood glucose meter kit and supplies Dispense a Accu Check meter.. E11.29 1 each 0  . brimonidine (ALPHAGAN P) 0.1 % SOLN PLACE 1 DROP INTO BOTH EYES 2 (TWO) TIMES DAILY. 5 mL 1  . Calcium Carb-Cholecalciferol (CALCIUM 600 + D PO) Take 1 tablet by mouth daily.     . Cholecalciferol (VITAMIN D3) 1000 UNITS CAPS Take 1 capsule by mouth daily.     . clopidogrel (PLAVIX) 75 MG tablet Take 1 tablet (75 mg total) by mouth daily. 90 tablet 2  . CRANBERRY PO Take 500 mg by mouth 2 (two) times daily.     Marland Kitchen exemestane (AROMASIN) 25 MG tablet TAKE 1 TABLET EVERY DAY AT BEDTIME 90 tablet 6  . Fluocinolone Acetonide 0.01 % SHAM Apply to scalp once daily; work into [employer] and allow to remain on scalp for ~5 minutes then  rinse with water. 120 mL 1  . glucose blood (COOL BLOOD GLUCOSE TEST STRIPS) test strip Use as instructed 100 each 12  . hydrALAZINE (APRESOLINE) 50 MG tablet TAKE 1 TABLET FOUR TIMES DAILY 270 tablet 2  . Lancets (ACCU-CHEK SOFT TOUCH) lancets Use as instructed 100 each 12  . mirtazapine (REMERON) 7.5 MG tablet Take 1 tablet (7.5 mg total) by mouth daily. 90 tablet 0  . Multiple Vitamins-Minerals (MULTIVITAMIN GUMMIES ADULT PO) Take 1 tablet by mouth daily.     Marland Kitchen omeprazole (PRILOSEC) 20 MG capsule Take 1 capsule (20 mg total) by mouth daily. 90 capsule 2  . ranitidine (ZANTAC) 150 MG capsule Take 1 capsule (150 mg total) by mouth 2 (two) times daily. 180 capsule 2   No current facility-administered medications for this visit.         Past Medical History:  Diagnosis Date  . Anxiety state 01/01/2015  . Arthritis    "all over"  . Breast cancer ([place])    Hx of R breast cancer s/p mastectomy; Currently has L breast cancer (Diagnosed 2015).   . Breast cancer, left breast ([place]) 01/01/2015  . CKD (chronic kidney disease), stage III 02/18/2015  . Decubitus ulcer of sacral region, stage 2 01/01/2015  .  Decubitus ulcer, buttock   . Diabetes mellitus with insulin therapy ([place]) 01/01/2015  . Essential hypertension 01/01/2015  . Glaucoma 01/01/2015  . Glaucoma, bilateral   . History of breast cancer 01/01/2015  . History of DVT (deep vein thrombosis) 1980's   BLE  . HLD (hyperlipidemia) 01/01/2015  . Hyperlipidemia   . Hypertension   . Itchy scalp 02/10/2015  . Osteoarthritis 01/01/2015  . Recurrent UTI 03/06/2015  . Right knee pain 02/10/2015  . TIA (transient ischemic attack) 02/18/2015  . Type II diabetes mellitus (HCC)    Insulin dependent.          Past Surgical History:  Procedure Laterality Date  . ABDOMINAL HYSTERECTOMY    . BREAST BIOPSY Right 1980's   +  . BREAST EXCISIONAL BIOPSY Left 2015   + stereo no surgery  . CHOLECYSTECTOMY    . MASTECTOMY Right  1980's  . MEDIAL PARTIAL KNEE REPLACEMENT Right 1980's?  . PERIPHERAL VASCULAR CATHETERIZATION  04/28/2015   Procedure: Lower Extremity Intervention; Surgeon: Algernon Huxley, MD; Location: [facility]; Service: Cardiovascular;;  . PERIPHERAL VASCULAR CATHETERIZATION N/A 04/28/2015   Procedure: Abdominal Aortogram w/Lower Extremity; Surgeon: Algernon Huxley, MD; Location: [facility]; Service: Cardiovascular; Laterality: N/A;  . PERIPHERAL VASCULAR CATHETERIZATION Left 10/20/2015   Procedure: Lower Extremity Angiography; Surgeon: Algernon Huxley, MD; Location: [facility]; Service: Cardiovascular; Laterality: Left;  . PERIPHERAL VASCULAR CATHETERIZATION  10/20/2015   Procedure: Lower Extremity Intervention; Surgeon: Algernon Huxley, MD; Location: [facility]; Service: Cardiovascular;;    Social History     Social History  Substance Use Topics  . Smoking status: Never Smoker  . Smokeless tobacco: Never Used  . Alcohol use No    Family History      Family History  Problem Relation Age of Onset  . Heart disease Mother   . Diabetes Son   . Diabetes Son   . Kidney disease Son   . Kidney cancer Neg Hx   . Bladder Cancer Neg Hx   . Prostate cancer Neg Hx     No Known Allergies   REVIEW OF SYSTEMS(Negative unless checked)  Constitutional: [] Weight loss[] Fever[] Chills Cardiac:[] Chest pain[] Chest pressure[] Palpitations [] Shortness of breath when laying flat [] Shortness of breath at rest [] Shortness of breath with exertion. Vascular: [] Pain in legs with walking[] Pain in legsat rest[] Pain in legs when laying flat [] Claudication [] Pain in feet when walking [] Pain in feet at rest [] Pain in feet when laying flat [] History of DVT [] Phlebitis [] Swelling in legs [] Varicose veins [] Non-healing ulcers Pulmonary: [] Uses home oxygen [] Productive cough[] Hemoptysis [] Wheeze [] COPD  [] Asthma Neurologic: [] Dizziness [] Blackouts [] Seizures [x] History of stroke [] History of TIA[] Aphasia [] Temporary blindness[] Dysphagia [x] Weaknessor numbness in arms [x] Weakness or numbnessin legs Musculoskeletal: [] Arthritis [] Joint swelling [] Joint pain [] Low back pain Hematologic:[] Easy bruising[] Easy bleeding [] Hypercoagulable state [] Anemic  Gastrointestinal:[] Blood in stool[] Vomiting blood[] Gastroesophageal reflux/heartburn[] Abdominal pain Genitourinary: [x] Chronic kidney disease [] Difficulturination [] Frequenturination [] Burning with urination[] Hematuria Skin: [] Rashes [] Ulcers [] Wounds Psychological: [] History of anxiety[] History of major depression.    Physical Examination  Resp 14   Ht 5' 1"  (1.549 m)   Wt 68.9 kg (152 lb)   BMI 28.72 kg/m  Gen:  WD/WN, NAD Head: [place]/AT, No temporalis wasting. Ear/Nose/Throat: Hearing grossly intact, nares w/o erythema or drainage, trachea midline Eyes: Conjunctiva clear. Sclera non-icteric Neck: Supple.  No JVD.  Pulmonary:  Good air movement, no use of accessory muscles.  Cardiac: RRR, normal S1, S2 Vascular:  Vessel Right Left  Radial Palpable Palpable  Ulnar Palpable Palpable  Brachial Palpable  Palpable  Carotid Palpable, without bruit Palpable, without bruit  Aorta Not palpable N/A  Femoral Palpable Palpable  Popliteal Palpable Palpable  PT 1+ Palpable Not Palpable  DP 1+ Palpable Not Palpable    Musculoskeletal: right hemiplegia with contractures.  Circular ulceration on the left medial ankle with very pale poor granulation tissue. Neurologic: Sensation grossly intact in extremities. Right-sided weakness. Speech is fluent.  Psychiatric: Judgment intact, Mood & affect appropriate for pt's clinical situation. Dermatologic: Left medial ankle ulceration as above    Labs No results found for this or any previous visit (from the past 2160  hour(s)).  Radiology No results found.    Assessment/Plan DM (diabetes mellitus), type 2 with neurological complications (HCC) blood glucose control important in reducing the progression of atherosclerotic disease. Also, involved in wound healing. On appropriate medications.   Essential hypertension blood pressure control important in reducing the progression of atherosclerotic disease. On appropriate oral medications.  Stroke [facility]) With residual right-sided weakness. Pain on her right side may be related to neuropathy after her stroke as well.  Atherosclerosis of native arteries of the extremities with ulceration ([place]) She has non-reconstructable vascular disease on the left lower extremity but her ABI today is a little better at 0.56 on the left. Previously this was 0.46. Her right ABI is noncompressible but her waveforms are good and her digit pressure is normal. This ulceration clearly represents a critical and limb threatening situation, but I do not think she has reasonable options for revascularization at this point. I would continue local wound care on the wound. Fortunately her left leg is not hurting that much and I do not think her right leg pain is secondary to perfusion. We will plan to recheck her ABIs in about 6 months. If things worsen, they will contact our office in the interim.     Leotis Pain, MD  02/15/2017 4:11 PM    This note was created with Dragon medical transcription system.  Any errors from dictation are purely unintentional

## 2017-02-15 NOTE — Patient Instructions (Signed)

## 2017-02-25 ENCOUNTER — Telehealth: Payer: Self-pay

## 2017-02-25 NOTE — Telephone Encounter (Signed)
Appointment scheduled per Dr Lacinda Axon 03/02/17

## 2017-02-25 NOTE — Telephone Encounter (Signed)
-----   Message from Leone Haven, MD sent at 02/24/2017  5:33 PM EDT ----- Regarding: call patient See most recent my chart message from patients daughter. She did not respond. Can you call her regarding the insulin and let me know so we can help her with this. Thanks. Eric.

## 2017-03-02 ENCOUNTER — Encounter: Payer: Self-pay | Admitting: Family Medicine

## 2017-03-02 ENCOUNTER — Ambulatory Visit (INDEPENDENT_AMBULATORY_CARE_PROVIDER_SITE_OTHER): Payer: Medicare PPO | Admitting: Family Medicine

## 2017-03-02 VITALS — BP 109/62 | HR 91 | Temp 97.8°F | Resp 16

## 2017-03-02 DIAGNOSIS — D649 Anemia, unspecified: Secondary | ICD-10-CM

## 2017-03-02 DIAGNOSIS — E1149 Type 2 diabetes mellitus with other diabetic neurological complication: Secondary | ICD-10-CM

## 2017-03-02 DIAGNOSIS — Z23 Encounter for immunization: Secondary | ICD-10-CM

## 2017-03-02 DIAGNOSIS — I7025 Atherosclerosis of native arteries of other extremities with ulceration: Secondary | ICD-10-CM

## 2017-03-02 DIAGNOSIS — R601 Generalized edema: Secondary | ICD-10-CM | POA: Diagnosis not present

## 2017-03-02 LAB — POCT GLYCOSYLATED HEMOGLOBIN (HGB A1C): HEMOGLOBIN A1C: 6.4

## 2017-03-02 MED ORDER — TRAMADOL HCL 50 MG PO TABS: 50.0000 mg | ORAL_TABLET | Freq: Three times a day (TID) | ORAL | 1 refills | Status: AC | PRN

## 2017-03-02 MED ORDER — FLUOCINOLONE ACETONIDE 0.01 % EX SHAM: MEDICATED_SHAMPOO | CUTANEOUS | 1 refills | Status: AC

## 2017-03-02 NOTE — Patient Instructions (Addendum)
Tramadol as prescribed.  We will call with the lab results.  Take care  Dr. Lacinda Axon

## 2017-03-03 ENCOUNTER — Other Ambulatory Visit: Payer: Self-pay | Admitting: Family Medicine

## 2017-03-03 ENCOUNTER — Telehealth: Payer: Self-pay | Admitting: Family Medicine

## 2017-03-03 DIAGNOSIS — R609 Edema, unspecified: Secondary | ICD-10-CM | POA: Insufficient documentation

## 2017-03-03 DIAGNOSIS — I7025 Atherosclerosis of native arteries of other extremities with ulceration: Secondary | ICD-10-CM

## 2017-03-03 NOTE — Telephone Encounter (Signed)
[name]  [id number] left shin leg wound,  , last week saw patient  seen it was just elongated and reddened area .  Wound has been present for a 1 week. Now  Measuring 8 cm long by 1.5 wide opening area of wound covered with sloth. Now wound is draining.  Denyse Amass RN, is wondering if patient needs to be referred to wound care center.

## 2017-03-03 NOTE — Assessment & Plan Note (Signed)
Appears stable. No signs of infection. Continue wound care via home health.

## 2017-03-03 NOTE — Assessment & Plan Note (Signed)
This is multifactorial. Primary issue is immobility. She also likely has protein calorie malnutrition. I advised elevation. Attempt was made at blood work but we were unable to get it.

## 2017-03-03 NOTE — Telephone Encounter (Signed)
[name] from [facility] called and wanted to speak with Dr. Jonathon Jordan CMA in regards to a new wound on pt's left leg. He had a couple of questions. Please advise, thank you!

## 2017-03-03 NOTE — Assessment & Plan Note (Signed)
A1C 6.4. No need for insulin. Continue to monitor.

## 2017-03-03 NOTE — Progress Notes (Signed)
Subjective:  Patient ID: [name], female    DOB: July 20, 1921  Age: 83 y.o. MRN: [id number]  CC: Follow up - Swelling, Wounds, Diabetes  HPI:  83 year old female with an extensive past medical history including coronary artery disease, type 2 diabetes, peripheral vascular disease, breast cancer, stroke presents for follow-up. Issues are outlined below.  PVD with ulceration  Patient has had ongoing wounds of her left lower extremity.  She currently has 2 wounds, one at the medial malleolus and the other at the anterior tibia.  She has seen vascular surgery. No intervention has been recommended at this time. Wound care was recommended.  Daughter states that home health is assisting with wound care. No drainage, redness, or other signs of infection.  Swelling  Daughter reports worsening edema of the lower extremities as well as the upper extremities.  She is wheelchair-bound and is minimally mobile.  Daughter tries to elevate but it's difficult.  Patient does not report any shortness of breath. She states she is feeling okay.  Type 2 diabetes  Daughter states she's been giving her insulin approximately 2 times a week, particularly after her sugar is elevated due to sugar intake.  I have previously informed her that she does not need insulin.  A1C today.  Social Hx   Social History   Social History  . Marital status: Widowed    Spouse name: N/A  . Number of children: N/A  . Years of education: N/A   Social History Main Topics  . Smoking status: Never Smoker  . Smokeless tobacco: Never Used  . Alcohol use No  . Drug use: No  . Sexual activity: No   Other Topics Concern  . None   Social History Narrative   Lives with daughter/daughter's husband.   Denita Lung [phone]).     Review of Systems  Constitutional: Negative for fever.  Respiratory: Negative for shortness of breath.   Cardiovascular: Positive for leg swelling.  Skin: Positive for wound.    Objective:  BP 109/62 (BP Location: Right Arm, Patient Position: Sitting, Cuff Size: Normal)   Pulse 91   Temp 97.8 F (36.6 C) (Oral)   Resp 16   SpO2 96%   BP/Weight 03/02/2017 02/15/2017 2/94/7654  Systolic BP 650 - 354  Diastolic BP 62 - 66  Wt. (Lbs) - 152 -  BMI - 28.72 -   Physical Exam  Constitutional:  Frail elderly female in no acute distress.  Cardiovascular: Normal rate and regular rhythm.   2-3+ pitting lower extremity edema Patient also has 2+ pitting edema of the right upper extremity and the left hand.  Pulmonary/Chest: Effort normal and breath sounds normal. She has no wheezes. She has no rales.  Neurological: She is alert.  Skin:  Left lower extremity - open wounds noted; left medial malleolus and anterior tibia. No drainage. No erythema.  Psychiatric: She has a normal mood and affect.  Vitals reviewed.   Lab Results  Component Value Date   WBC 11.9 (H) 01/26/2016   HGB 10.8 (L) 01/26/2016   HCT 32.7 (L) 01/26/2016   PLT 484 (H) 01/26/2016   GLUCOSE 155 (H) 01/26/2016   CHOL 84 02/19/2015   TRIG 88 02/19/2015   HDL 27 (L) 02/19/2015   LDLCALC 39 02/19/2015   ALT 13 (L) 01/26/2016   AST 21 01/26/2016   NA 138 01/26/2016   K 3.6 01/26/2016   CL 107 01/26/2016   CREATININE 0.67 09/29/2016   BUN 27 (H) 09/29/2016  CO2 26 01/26/2016   TSH 2.04 09/01/2015   HGBA1C 6.4 03/02/2017    Assessment & Plan:   Problem List Items Addressed This Visit    Atherosclerosis of native arteries of the extremities with ulceration ([place]) - Primary    Appears stable. No signs of infection. Continue wound care via home health.      DM (diabetes mellitus), type 2 with neurological complications (HCC)    J6B 6.4. No need for insulin. Continue to monitor.       Relevant Orders   POCT HgB A1C (Completed)   Comprehensive metabolic panel   Hemoglobin A1c   Edema    This is multifactorial. Primary issue is immobility. She also likely has protein calorie  malnutrition. I advised elevation. Attempt was made at blood work but we were unable to get it.        Other Visit Diagnoses    Anemia, unspecified type       Relevant Orders   CBC   Encounter for immunization       Relevant Orders   Flu vaccine HIGH DOSE PF (Completed)      Meds ordered this encounter  Medications  . Fluocinolone Acetonide 0.01 % SHAM    Sig: Apply to scalp once daily; work into [employer] and allow to remain on scalp for ~5 minutes then rinse with water.    Dispense:  120 mL    Refill:  1  . traMADol (ULTRAM) 50 MG tablet    Sig: Take 1-2 tablets (50-100 mg total) by mouth every 8 (eight) hours as needed.    Dispense:  90 tablet    Refill:  1    Follow-up: As needed  [place]

## 2017-03-14 ENCOUNTER — Telehealth (INDEPENDENT_AMBULATORY_CARE_PROVIDER_SITE_OTHER): Payer: Self-pay

## 2017-03-14 NOTE — Telephone Encounter (Signed)
Evelena Peat from [facility] called stating the patient has a wound that has opened on her left leg ( calf ) and he was wondering if this is because of her vascular issues or not. He stated he was going to speak with her other doctor to see if she should be seen or any other treatment should be done.

## 2017-03-24 ENCOUNTER — Encounter: Payer: Medicare PPO | Attending: Surgery | Admitting: Surgery

## 2017-03-24 ENCOUNTER — Telehealth: Payer: Self-pay | Admitting: Family

## 2017-03-24 DIAGNOSIS — N186 End stage renal disease: Secondary | ICD-10-CM | POA: Insufficient documentation

## 2017-03-24 DIAGNOSIS — L89153 Pressure ulcer of sacral region, stage 3: Secondary | ICD-10-CM | POA: Diagnosis not present

## 2017-03-24 DIAGNOSIS — E1122 Type 2 diabetes mellitus with diabetic chronic kidney disease: Secondary | ICD-10-CM | POA: Insufficient documentation

## 2017-03-24 DIAGNOSIS — E11622 Type 2 diabetes mellitus with other skin ulcer: Secondary | ICD-10-CM | POA: Insufficient documentation

## 2017-03-24 DIAGNOSIS — I120 Hypertensive chronic kidney disease with stage 5 chronic kidney disease or end stage renal disease: Secondary | ICD-10-CM | POA: Insufficient documentation

## 2017-03-24 DIAGNOSIS — Z794 Long term (current) use of insulin: Secondary | ICD-10-CM | POA: Insufficient documentation

## 2017-03-24 DIAGNOSIS — L97223 Non-pressure chronic ulcer of left calf with necrosis of muscle: Secondary | ICD-10-CM | POA: Insufficient documentation

## 2017-03-24 DIAGNOSIS — E785 Hyperlipidemia, unspecified: Secondary | ICD-10-CM | POA: Insufficient documentation

## 2017-03-24 DIAGNOSIS — Z8673 Personal history of transient ischemic attack (TIA), and cerebral infarction without residual deficits: Secondary | ICD-10-CM | POA: Diagnosis not present

## 2017-03-24 DIAGNOSIS — M1990 Unspecified osteoarthritis, unspecified site: Secondary | ICD-10-CM | POA: Diagnosis not present

## 2017-03-24 DIAGNOSIS — Z853 Personal history of malignant neoplasm of breast: Secondary | ICD-10-CM | POA: Insufficient documentation

## 2017-03-24 DIAGNOSIS — I70242 Atherosclerosis of native arteries of left leg with ulceration of calf: Secondary | ICD-10-CM | POA: Diagnosis not present

## 2017-03-24 DIAGNOSIS — L97323 Non-pressure chronic ulcer of left ankle with necrosis of muscle: Secondary | ICD-10-CM | POA: Insufficient documentation

## 2017-03-24 DIAGNOSIS — L89313 Pressure ulcer of right buttock, stage 3: Secondary | ICD-10-CM | POA: Insufficient documentation

## 2017-03-24 NOTE — Telephone Encounter (Signed)
Tried calling patient but was unable to speak or leave VM due to phone kept ringing.

## 2017-03-24 NOTE — Telephone Encounter (Signed)
Spoke with wound center and Dr Con Memos, MD and Medical Director; he has  discussed palliative and hospice care with family today.   Plans to continue palliative care for wounds however wanted [place] to be aware of poor healing wounds and conversation with family.    [name] may call family of patient and let them know we are happy to help them in anyway They may schedule a follow up in our office if they would like

## 2017-03-26 NOTE — Progress Notes (Signed)
[name], [name] ([id number]) Visit Report for 03/24/2017 Allergy List Details Patient Name: [name], Yang Date of Service: 03/24/2017 10:30 AM Medical Record Number: [id number] Patient Account Number: [id number] Date of Birth/Sex: 1921/08/27 (83 y.o. Female) Treating RN: Carolyne Fiscal, Debi Primary Care Jubal Rademaker: [name] Other Clinician: Referring Bowen Goyal: [name] Treating Gordon Vandunk/Extender: Frann Rider in Treatment: 0 Allergies Active Allergies No Known Allergies Allergy Notes Electronic Signature(s) Signed: 03/25/2017 9:47:23 AM By: Alric Quan Entered By: Alric Quan on 03/24/2017 10:34:05 Garretson, [name] ([id number]) -------------------------------------------------------------------------------- Arrival Information Details Patient Name: [name] Date of Service: 03/24/2017 10:30 AM Medical Record Number: [id number] Patient Account Number: [id number] Date of Birth/Sex: May 22, 1922 (83 y.o. Female) Treating RN: Carolyne Fiscal, Debi Primary Care Layne Lebon: [name] Other Clinician: Referring Guadalupe Kerekes: [name] Treating Lajuanna Pompa/Extender: Frann Rider in Treatment: 0 Visit Information Patient Arrived: Wheel Chair Arrival Time: 10:29 Accompanied By: daughter Transfer Assistance: Other Patient Identification Verified: Yes Secondary Verification Process Yes Completed: Patient Requires Transmission-Based No Precautions: Patient Has Alerts: Yes Patient Alerts: DM II History Since Last Visit All ordered tests and consults were completed: No Added or deleted any medications: No Any new allergies or adverse reactions: No Had a fall or experienced change in activities of daily living that may affect risk of falls: No Signs or symptoms of abuse/neglect since last visito No Hospitalized since last visit: No Electronic Signature(s) Signed: 03/25/2017 9:47:23 AM By: Alric Quan Entered By: Alric Quan on 03/24/2017 10:32:55 Pawelski, [name]  ([id number]) -------------------------------------------------------------------------------- Clinic Level of Care Assessment Details Patient Name: [name] Date of Service: 03/24/2017 10:30 AM Medical Record Number: [id number] Patient Account Number: [id number] Date of Birth/Sex: June 10, 1922 (83 y.o. Female) Treating RN: Carolyne Fiscal, Debi Primary Care Shine Scrogham: [name] Other Clinician: Referring Jarius Dieudonne: [name] Treating Leauna Sharber/Extender: Frann Rider in Treatment: 0 Clinic Level of Care Assessment Items TOOL 2 Quantity Score X - Use when only an EandM is performed on the INITIAL visit 1 0 ASSESSMENTS - Nursing Assessment / Reassessment X - General Physical Exam (combine w/ comprehensive assessment (listed just 1 20 below) when performed on new pt. evals) X - Comprehensive Assessment (HX, ROS, Risk Assessments, Wounds Hx, etc.) 1 25 ASSESSMENTS - Wound and Skin Assessment / Reassessment []  - Simple Wound Assessment / Reassessment - one wound 0 X - Complex Wound Assessment / Reassessment - multiple wounds 5 5 []  - Dermatologic / Skin Assessment (not related to wound area) 0 ASSESSMENTS - Ostomy and/or Continence Assessment and Care []  - Incontinence Assessment and Management 0 []  - Ostomy Care Assessment and Management (repouching, etc.) 0 PROCESS - Coordination of Care []  - Simple Patient / Family Education for ongoing care 0 X - Complex (extensive) Patient / Family Education for ongoing care 1 20 X - Staff obtains Programmer, systems, Records, Test Results / Process Orders 1 10 X - Staff telephones HHA, Nursing Homes / Clarify orders / etc 1 10 []  - Routine Transfer to another Facility (non-emergent condition) 0 []  - Routine Hospital Admission (non-emergent condition) 0 X - New Admissions / Biomedical engineer / Ordering NPWT, Apligraf, etc. 1 15 []  - Emergency Hospital Admission (emergent condition) 0 X - Simple Discharge Coordination 1 10 Karle, Evanny  ([id number]) []  - Complex (extensive) Discharge Coordination 0 PROCESS - Special Needs []  - Pediatric / Minor Patient Management 0 []  - Isolation Patient Management 0 []  - Hearing / Language / Visual special needs 0 []  - Assessment of Community assistance (transportation, D/C planning, etc.) 0 []  - Additional assistance / Altered mentation 0 []  -  Support Surface(s) Assessment (bed, cushion, seat, etc.) 0 INTERVENTIONS - Wound Cleansing / Measurement X - Wound Imaging (photographs - any number of wounds) 1 5 []  - Wound Tracing (instead of photographs) 0 []  - Simple Wound Measurement - one wound 0 X - Complex Wound Measurement - multiple wounds 5 5 []  - Simple Wound Cleansing - one wound 0 X - Complex Wound Cleansing - multiple wounds 5 5 INTERVENTIONS - Wound Dressings X - Small Wound Dressing one or multiple wounds 5 10 []  - Medium Wound Dressing one or multiple wounds 0 []  - Large Wound Dressing one or multiple wounds 0 []  - Application of Medications - injection 0 INTERVENTIONS - Miscellaneous []  - External ear exam 0 []  - Specimen Collection (cultures, biopsies, blood, body fluids, etc.) 0 []  - Specimen(s) / Culture(s) sent or taken to Lab for analysis 0 []  - Patient Transfer (multiple staff / Civil Service fast streamer / Similar devices) 0 []  - Simple Staple / Suture removal (25 or less) 0 []  - Complex Staple / Suture removal (26 or more) 0 Mccalister, Taniah ([id number]) []  - Hypo / Hyperglycemic Management (close monitor of Blood Glucose) 0 []  - Ankle / Brachial Index (ABI) - do not check if billed separately 0 Has the patient been seen at the hospital within the last three years: Yes Total Score: 240 Level Of Care: New/Established - Level 5 Electronic Signature(s) Signed: 03/25/2017 9:47:23 AM By: Alric Quan Entered By: Alric Quan on 03/24/2017 14:34:11 Suleiman, Bristyn ([id number]) -------------------------------------------------------------------------------- Encounter Discharge  Information Details Patient Name: [name] Date of Service: 03/24/2017 10:30 AM Medical Record Number: [id number] Patient Account Number: [id number] Date of Birth/Sex: 06-07-1922 (83 y.o. Female) Treating RN: Carolyne Fiscal, Debi Primary Care Brason Berthelot: [name] Other Clinician: Referring Jakhari Space: [name] Treating Efren Kross/Extender: Frann Rider in Treatment: 0 Encounter Discharge Information Items Discharge Pain Level: 0 Discharge Condition: Stable Ambulatory Status: Wheelchair Discharge Destination: Home Transportation: Private Auto daughter, Accompanied By: grandson Schedule Follow-up Appointment: Yes Medication Reconciliation completed and provided to Patient/Care No Joanne Salah: Provided on Clinical Summary of Care: 03/24/2017 Form Type Recipient Paper Patient VD Electronic Signature(s) Signed: 03/25/2017 9:47:23 AM By: Alric Quan Entered By: Alric Quan on 03/24/2017 14:32:16 Shirk, Cassidey ([id number]) -------------------------------------------------------------------------------- Lower Extremity Assessment Details Patient Name: [name] Date of Service: 03/24/2017 10:30 AM Medical Record Number: [id number] Patient Account Number: [id number] Date of Birth/Sex: [birth year] (83 y.o. Female) Treating RN: Carolyne Fiscal, Debi Primary Care Tasnia Spegal: [name] Other Clinician: Referring Keiton Cosma: [name] Treating Arelie Kuzel/Extender: Frann Rider in Treatment: 0 Edema Assessment Assessed: [Left: No] [Right: No] E[Left: dema] [Right: :] Calf Left: Right: Point of Measurement: 27 cm From Medial Instep 29.3 cm cm Ankle Left: Right: Point of Measurement: 9 cm From Medial Instep 21 cm cm Vascular Assessment Pulses: Dorsalis Pedis Palpable: [Left:No] Doppler Audible: [Left:Yes] Posterior Tibial Palpable: [Left:No] Doppler Audible: [Left:Yes] Extremity colors, hair growth, and conditions: Extremity Color:  [Left:Hyperpigmented] Temperature of Extremity: [Left:Warm] Capillary Refill: [Left:< 3 seconds] Toe Nail Assessment Left: Right: Thick: Yes Discolored: Yes Deformed: Yes Improper Length and Hygiene: No Notes ABIs done at [place] Signature(s) ROSELANI, GRAJEDA ([id number]) Signed: 03/25/2017 9:47:23 AM By: Alric Quan Entered By: Alric Quan on 03/24/2017 10:51:07 Meader, [name] ([id number]) -------------------------------------------------------------------------------- Multi Wound Chart Details Patient Name: [name] Date of Service: 03/24/2017 10:30 AM Medical Record Number: [id number] Patient Account Number: [id number] Date of Birth/Sex: 1921/07/15 (83 y.o. Female) Treating RN: Carolyne Fiscal, Debi Primary Care Beza Steppe: [name] Other Clinician: Referring Anayla Giannetti: [name] Treating Augustine Brannick/Extender: Frann Rider  in Treatment: 0 Vital Signs Height(in): 60 Pulse(bpm): 108 Weight(lbs): 157 Blood Pressure 154/52 (mmHg): Body Mass Index(BMI): 31 Temperature(F): 97.5 Respiratory Rate 16 (breaths/min): Photos: [3:No Photos] [4:No Photos] [5:No Photos] Wound Location: [3:Left, Medial Malleolus] [4:Left Lower Leg - Anterior] [5:Sacrum - Medial] Wounding Event: [3:Gradually Appeared] [4:Gradually Appeared] [5:Pressure Injury] Primary Etiology: [3:Diabetic Wound/Ulcer of the Lower Extremity] [4:Diabetic Wound/Ulcer of the Lower Extremity] [5:Pressure Ulcer] Comorbid History: [3:Glaucoma, Hypertension, Type II Diabetes, End Stage Renal Disease, Osteoarthritis] [4:Glaucoma, Hypertension, Type II Diabetes, End Stage Renal Disease, Osteoarthritis] [5:Glaucoma, Hypertension, Type II Diabetes, End Stage Renal  Disease, Osteoarthritis] Date Acquired: [3:03/10/2017] [4:03/09/2017] [5:03/09/2017] Weeks of Treatment: [3:0] [4:0] [5:0] Wound Status: [3:Open] [4:Open] [5:Open] Measurements L x W x D 1.8x2.3x0.4 [4:8x2.4x0.9] [5:2.3x2x0.1] (cm) Area (cm) :  [3:3.252] [4:15.08] [5:3.613] Volume (cm) : [3:1.301] [4:13.572] [5:0.361] % Reduction in Area: [3:N/A] [4:0.00%] [5:0.00%] % Reduction in Volume: N/A [4:0.00%] [5:0.00%] Classification: [3:Grade 1] [4:Grade 2] [5:Category/Stage III] Exudate Amount: [3:Large] [4:Large] [5:Large] Exudate Type: [3:Serosanguineous] [4:Serosanguineous] [5:Serous] Exudate Color: [3:red, brown] [4:red, brown] [5:amber] Foul Odor After [3:Yes] [4:Yes] [5:No] Cleansing: Odor Anticipated Due to No [4:No] [5:N/A] Product Use: Wound Margin: [3:Distinct, outline attached] [4:Distinct, outline attached] [5:Flat and Intact] Granulation Amount: [3:Medium (34-66%)] [4:Small (1-33%)] [5:Small (1-33%)] Granulation Quality: [3:Red, Pink] [4:Red, Pink] [5:Pink] Necrotic Amount: [3:Medium (34-66%)] [4:Large (67-100%)] [5:Large (67-100%)] Necrotic Tissue: Eschar, Adherent Slough Eschar, Adherent Slough Eschar, Adherent Slough Epithelialization: None None None Periwound Skin Texture: Excoriation: No Excoriation: No Excoriation: No Induration: No Induration: No Induration: No Callus: No Callus: No Callus: No Crepitus: No Crepitus: No Crepitus: No Rash: No Rash: No Rash: No Scarring: No Scarring: No Scarring: No Periwound Skin Maceration: No Maceration: No Maceration: No Moisture: Dry/Scaly: No Dry/Scaly: No Dry/Scaly: No Periwound Skin Color: Atrophie Blanche: No Atrophie Blanche: No Atrophie Blanche: No Cyanosis: No Cyanosis: No Cyanosis: No Ecchymosis: No Ecchymosis: No Ecchymosis: No Erythema: No Erythema: No Erythema: No Hemosiderin Staining: No Hemosiderin Staining: No Hemosiderin Staining: No Mottled: No Mottled: No Mottled: No Pallor: No Pallor: No Pallor: No Rubor: No Rubor: No Rubor: No Temperature: N/A No Abnormality No Abnormality Tenderness on No Yes Yes Palpation: Wound Preparation: Ulcer Cleansing: Ulcer Cleansing: Ulcer Cleansing: Rinsed/Irrigated with Rinsed/Irrigated  with Rinsed/Irrigated with Saline Saline Saline Topical Anesthetic Topical Anesthetic Topical Anesthetic Applied: Other: lidocaine Applied: Other: lidocaine Applied: Other: lidocaine 4% 4% 4% Wound Number: 6 7 N/A Photos: No Photos No Photos N/A Wound Location: Right Sacrum Right Gluteus N/A Wounding Event: Pressure Injury Pressure Injury N/A Primary Etiology: Pressure Ulcer Pressure Ulcer N/A Comorbid History: Glaucoma, Hypertension, Glaucoma, Hypertension, N/A Type II Diabetes, End Type II Diabetes, End Stage Renal Disease, Stage Renal Disease, Osteoarthritis Osteoarthritis Date Acquired: 03/09/2017 03/09/2017 N/A Weeks of Treatment: 0 0 N/A Wound Status: Open Open N/A Measurements L x W x D 0.5x2.8x0.1 0.8x1x0.1 N/A (cm) Area (cm) : 1.1 0.628 N/A Volume (cm) : 0.11 0.063 N/A % Reduction in Area: 0.00% 0.00% N/A % Reduction in Volume: 0.00% 0.00% N/A Classification: Category/Stage III Category/Stage III N/A Exudate Amount: Large Large N/A Exudate Type: Serous Serous N/A Deamer, Lyrica ([id number]) Exudate Color: amber amber N/A Foul Odor After No No N/A Cleansing: Odor Anticipated Due to N/A N/A N/A Product Use: Wound Margin: Flat and Intact Flat and Intact N/A Granulation Amount: Small (1-33%) Large (67-100%) N/A Granulation Quality: Red, Pink Red N/A Necrotic Amount: Large (67-100%) Small (1-33%) N/A Necrotic Tissue: Eschar Adherent Slough N/A Exposed Structures: Fascia: No Fat Layer (Subcutaneous N/A Fat Layer (Subcutaneous Tissue) Exposed: Yes Tissue)  Exposed: No Fascia: No Tendon: No Tendon: No Muscle: No Muscle: No Joint: No Joint: No Bone: No Bone: No Epithelialization: None None N/A Periwound Skin Texture: Excoriation: No Excoriation: No N/A Induration: No Induration: No Callus: No Callus: No Crepitus: No Crepitus: No Rash: No Rash: No Scarring: No Scarring: No Periwound Skin Maceration: No Maceration: No N/A Moisture: Dry/Scaly:  No Dry/Scaly: No Periwound Skin Color: Atrophie Blanche: No Atrophie Blanche: No N/A Cyanosis: No Cyanosis: No Ecchymosis: No Ecchymosis: No Erythema: No Erythema: No Hemosiderin Staining: No Hemosiderin Staining: No Mottled: No Mottled: No Pallor: No Pallor: No Rubor: No Rubor: No Temperature: N/A N/A N/A Tenderness on Yes Yes N/A Palpation: Wound Preparation: Ulcer Cleansing: Ulcer Cleansing: N/A Rinsed/Irrigated with Rinsed/Irrigated with Saline Saline Topical Anesthetic Topical Anesthetic Applied: Other: lidocaine Applied: Other: lidocaine 4% 4% Treatment Notes Electronic Signature(s) Signed: 03/24/2017 11:48:22 AM By: Christin Fudge MD, FACS Szymczak, Ghadeer ([id number]) Entered By: Christin Fudge on 03/24/2017 11:48:22 PERLITA, FORBUSH ([id number]) -------------------------------------------------------------------------------- [place] Details Patient Name: [name] Date of Service: 03/24/2017 10:30 AM Medical Record Number: [id number] Patient Account Number: [id number] Date of Birth/Sex: 1922/01/19 (83 y.o. Female) Treating RN: Carolyne Fiscal, Debi Primary Care Carlitos Bottino: [name] Other Clinician: Referring Shonta Phillis: [name] Treating Arlinda Barcelona/Extender: Frann Rider in Treatment: 0 Active Inactive ` Abuse / Safety / Falls / Self Care Management Nursing Diagnoses: Potential for falls Goals: Patient will not experience any injury related to falls Date Initiated: 03/24/2017 Target Resolution Date: 08/20/2017 Goal Status: Active Interventions: Assess: immobility, friction, shearing, incontinence upon admission and as needed Assess impairment of mobility on admission and as needed per policy Assess personal safety and home safety (as indicated) on admission and as needed Assess self care needs on admission and as needed Notes: ` Nutrition Nursing Diagnoses: Imbalanced nutrition Impaired glucose control: actual or potential Potential  for alteratiion in Nutrition/Potential for imbalanced nutrition Goals: Patient/caregiver agrees to and verbalizes understanding of need to use nutritional supplements and/or vitamins as prescribed Date Initiated: 03/24/2017 Target Resolution Date: 07/23/2017 Goal Status: Active Interventions: Assess patient nutrition upon admission and as needed per policy Provide education on elevated blood sugars and impact on wound healing Provide education on nutrition MALAYJAH, OTOOLE ([id number]) Notes: ` Orientation to the Wound Care Program Nursing Diagnoses: Knowledge deficit related to the wound healing center program Goals: Patient/caregiver will verbalize understanding of the [facility] Date Initiated: 03/24/2017 Target Resolution Date: 04/23/2017 Goal Status: Active Interventions: Provide education on orientation to the wound center Notes: ` Pain, Acute or Chronic Nursing Diagnoses: Pain, acute or chronic: actual or potential Potential alteration in comfort, pain Goals: Patient/caregiver will verbalize adequate pain control between visits Date Initiated: 03/24/2017 Target Resolution Date: 07/23/2017 Goal Status: Active Interventions: Complete pain assessment as per visit requirements Notes: ` Pressure Nursing Diagnoses: Knowledge deficit related to causes and risk factors for pressure ulcer development Knowledge deficit related to management of pressures ulcers Potential for impaired tissue integrity related to pressure, friction, moisture, and shear Goals: Patient will remain free from development of additional pressure ulcers Date Initiated: 03/24/2017 Target Resolution Date: 08/20/2017 Goal Status: Active CLOTEAL, ISAACSON ([id number]) Interventions: Assess: immobility, friction, shearing, incontinence upon admission and as needed Assess offloading mechanisms upon admission and as needed Assess potential for pressure ulcer upon admission and as needed Provide  education on pressure ulcers Notes: ` Wound/Skin Impairment Nursing Diagnoses: Impaired tissue integrity Knowledge deficit related to ulceration/compromised skin integrity Goals: Ulcer/skin breakdown will have a volume reduction of 80% by week 12 Date  Initiated: 03/24/2017 Target Resolution Date: 07/23/2017 Goal Status: Active Interventions: Assess patient/caregiver ability to perform ulcer/skin care regimen upon admission and as needed Assess ulceration(s) every visit Notes: Electronic Signature(s) Signed: 03/25/2017 9:47:23 AM By: Alric Quan Entered By: Alric Quan on 03/24/2017 11:27:19 Spegal, Tyshawn ([id number]) -------------------------------------------------------------------------------- Pain Assessment Details Patient Name: [name] Date of Service: 03/24/2017 10:30 AM Medical Record Number: [id number] Patient Account Number: [id number] Date of Birth/Sex: September 22, 1921 (83 y.o. Female) Treating RN: Carolyne Fiscal, Debi Primary Care Yousra Ivens: [name] Other Clinician: Referring Demi Trieu: [name] Treating Yanis Juma/Extender: Frann Rider in Treatment: 0 Active Problems Location of Pain Severity and Description of Pain Patient Has Paino No Site Locations Pain Management and Medication Current Pain Management: Electronic Signature(s) Signed: 03/25/2017 9:47:23 AM By: Alric Quan Entered By: Alric Quan on 03/24/2017 10:33:01 [name] ([id number]) -------------------------------------------------------------------------------- Patient/Caregiver Education Details Patient Name: [name] Date of Service: 03/24/2017 10:30 AM Medical Record Number: [id number] Patient Account Number: [id number] Date of Birth/Gender: 1922-04-21 (83 y.o. Female) Treating RN: Carolyne Fiscal, Debi Primary Care Physician: [name] Other Clinician: Referring Physician: [name] Treating Physician/Extender: Frann Rider in Treatment: 0 Education  Assessment Education Provided To: Patient and Caregiver daughter Education Topics Provided Elevated Blood Sugar/ Impact on Healing: Handouts: Elevated Blood Sugars: How Do They Affect Wound Healing Methods: Explain/Verbal Responses: State content correctly Nutrition: Handouts: Elevated Blood Sugars: How Do They Affect Wound Healing, Nutrition Methods: Explain/Verbal Responses: State content correctly Pressure: Handouts: Pressure Ulcers: Care and Offloading Methods: Explain/Verbal Responses: State content correctly Welcome To [facility] [place]: Handouts: Welcome To [facility] Methods: Explain/Verbal Responses: State content correctly Wound/Skin Impairment: Handouts: Other: change dressing as ordered Methods: Demonstration, Explain/Verbal Responses: State content correctly Electronic Signature(s) Signed: 03/25/2017 9:47:23 AM By: Alric Quan Entered By: Alric Quan on 03/24/2017 14:33:10 Gulick, Boneta ([id number]) -------------------------------------------------------------------------------- Wound Assessment Details Patient Name: [name] Date of Service: 03/24/2017 10:30 AM Medical Record Number: [id number] Patient Account Number: [id number] Date of Birth/Sex: Aug 02, 1921 (83 y.o. Female) Treating RN: Carolyne Fiscal, Debi Primary Care Yannely Kintzel: [name] Other Clinician: Referring Teegan Brandis: [name] Treating Lavonne Kinderman/Extender: Frann Rider in Treatment: 0 Wound Status Wound Number: 3 Primary Diabetic Wound/Ulcer of the Lower Etiology: Extremity Wound Location: Left, Medial Malleolus Wound Open Wounding Event: Gradually Appeared Status: Date Acquired: 03/10/2017 Comorbid Glaucoma, Hypertension, Type II Weeks Of Treatment: 0 History: Diabetes, End Stage Renal Disease, Clustered Wound: No Osteoarthritis Photos Photo Uploaded By: Alric Quan on 03/25/2017 09:56:32 Wound Measurements Length: (cm) 1.8 Width: (cm) 2.3 Depth:  (cm) 0.4 Area: (cm) 3.252 Volume: (cm) 1.301 % Reduction in Area: % Reduction in Volume: Epithelialization: None Tunneling: No Undermining: No Wound Description Classification: Grade 1 Foul Odor Aft Wound Margin: Distinct, outline attached Due to Produc Exudate Amount: Large Slough/Fibrin Exudate Type: Serosanguineous Exudate Color: red, brown er Cleansing: Yes t Use: No o Yes Wound Bed Granulation Amount: Medium (34-66%) Granulation Quality: Red, Pink Necrotic Amount: Medium (34-66%) Necrotic Quality: Eschar, Adherent Brandt, Darchelle ([id number]) Periwound Skin Texture Texture Color No Abnormalities Noted: No No Abnormalities Noted: No Callus: No Atrophie Blanche: No Crepitus: No Cyanosis: No Excoriation: No Ecchymosis: No Induration: No Erythema: No Rash: No Hemosiderin Staining: No Scarring: No Mottled: No Pallor: No Moisture Rubor: No No Abnormalities Noted: No Dry / Scaly: No Maceration: No Wound Preparation Ulcer Cleansing: Rinsed/Irrigated with Saline Topical Anesthetic Applied: Other: lidocaine 4%, Treatment Notes Wound #3 (Left, Medial Malleolus) 1. Cleansed with: Clean wound with Normal Saline 2. Anesthetic Topical Lidocaine 4% cream to wound bed  prior to debridement 4. Dressing Applied: Santyl Ointment 5. Secondary Dressing Applied ABD Pad Dry Gauze Kerlix/Conform 7. Secured with Tape Notes stretch netting #5 Electronic Signature(s) Signed: 03/25/2017 9:47:23 AM By: Alric Quan Entered By: Alric Quan on 03/24/2017 10:59:24 Llamas, [name] ([id number]) -------------------------------------------------------------------------------- Wound Assessment Details Patient Name: [name] Date of Service: 03/24/2017 10:30 AM Medical Record Number: [id number] Patient Account Number: [id number] Date of Birth/Sex: Oct 11, 1921 (83 y.o. Female) Treating RN: Carolyne Fiscal, Debi Primary Care Tamalyn Wadsworth: [name] Other  Clinician: Referring Loralyn Rachel: [name] Treating Cristela Stalder/Extender: Frann Rider in Treatment: 0 Wound Status Wound Number: 4 Primary Diabetic Wound/Ulcer of the Lower Etiology: Extremity Wound Location: Left Lower Leg - Anterior Wound Open Wounding Event: Gradually Appeared Status: Date Acquired: 03/09/2017 Comorbid Glaucoma, Hypertension, Type II Weeks Of Treatment: 0 History: Diabetes, End Stage Renal Disease, Clustered Wound: No Osteoarthritis Photos Photo Uploaded By: Alric Quan on 03/25/2017 09:56:32 Wound Measurements Length: (cm) 8 Width: (cm) 2.4 Depth: (cm) 0.9 Area: (cm) 15.08 Volume: (cm) 13.572 % Reduction in Area: 0% % Reduction in Volume: 0% Epithelialization: None Tunneling: No Undermining: No Wound Description Classification: Grade 2 Foul Odor Aft Wound Margin: Distinct, outline attached Due to Produc Exudate Amount: Large Slough/Fibrin Exudate Type: Serosanguineous Exudate Color: red, brown er Cleansing: Yes t Use: No o Yes Wound Bed Granulation Amount: Small (1-33%) Exposed Structure Granulation Quality: Red, Pink Fascia Exposed: Yes Necrotic Amount: Large (67-100%) Fat Layer (Subcutaneous Tissue) Exposed: Yes Necrotic Quality: Eschar, Adherent Slough Tendon Exposed: Yes Goodloe, Deeana ([id number]) Periwound Skin Texture Texture Color No Abnormalities Noted: No No Abnormalities Noted: No Callus: No Atrophie Blanche: No Crepitus: No Cyanosis: No Excoriation: No Ecchymosis: No Induration: No Erythema: No Rash: No Hemosiderin Staining: No Scarring: No Mottled: No Pallor: No Moisture Rubor: No No Abnormalities Noted: No Dry / Scaly: No Temperature / Pain Maceration: No Temperature: No Abnormality Tenderness on Palpation: Yes Wound Preparation Ulcer Cleansing: Rinsed/Irrigated with Saline Topical Anesthetic Applied: Other: lidocaine 4%, Treatment Notes Wound #4 (Left, Anterior Lower Leg) 1. Cleansed  with: Clean wound with Normal Saline 2. Anesthetic Topical Lidocaine 4% cream to wound bed prior to debridement 4. Dressing Applied: Santyl Ointment 5. Secondary Dressing Applied ABD Pad Dry Gauze Kerlix/Conform 7. Secured with Tape Notes stretch netting #5 Electronic Signature(s) Signed: 03/25/2017 9:47:23 AM By: Alric Quan Entered By: Alric Quan on 03/24/2017 11:45:27 Belisle, Azariya ([id number]) -------------------------------------------------------------------------------- Wound Assessment Details Patient Name: [name] Date of Service: 03/24/2017 10:30 AM Medical Record Number: [id number] Patient Account Number: [id number] Date of Birth/Sex: August 20, 1921 (83 y.o. Female) Treating RN: Carolyne Fiscal, Debi Primary Care Kaitlen Redford: [name] Other Clinician: Referring Shariece Viveiros: [name] Treating Findley Blankenbaker/Extender: Frann Rider in Treatment: 0 Wound Status Wound Number: 5 Primary Pressure Ulcer Etiology: Wound Location: Sacrum - Medial Wound Open Wounding Event: Pressure Injury Status: Date Acquired: 03/09/2017 Comorbid Glaucoma, Hypertension, Type II Weeks Of Treatment: 0 History: Diabetes, End Stage Renal Disease, Clustered Wound: No Osteoarthritis Photos Photo Uploaded By: Alric Quan on 03/25/2017 09:55:57 Wound Measurements Length: (cm) 2.3 Width: (cm) 2 Depth: (cm) 0.1 Area: (cm) 3.613 Volume: (cm) 0.361 % Reduction in Area: 0% % Reduction in Volume: 0% Epithelialization: None Tunneling: No Undermining: No Wound Description Classification: Category/Stage III Wound Margin: Flat and Intact Exudate Amount: Large Exudate Type: Serous Exudate Color: amber Foul Odor After Cleansing: No Slough/Fibrino No Wound Bed Granulation Amount: Small (1-33%) Exposed Structure Granulation Quality: Pink Fascia Exposed: No Necrotic Amount: Large (67-100%) Fat Layer (Subcutaneous Tissue) Exposed: Yes Necrotic Quality: Eschar, Adherent  Slough Tendon  Exposed: No Vayda, Larine ([id number]) Muscle Exposed: No Joint Exposed: No Bone Exposed: No Periwound Skin Texture Texture Color No Abnormalities Noted: No No Abnormalities Noted: No Callus: No Atrophie Blanche: No Crepitus: No Cyanosis: No Excoriation: No Ecchymosis: No Induration: No Erythema: No Rash: No Hemosiderin Staining: No Scarring: No Mottled: No Pallor: No Moisture Rubor: No No Abnormalities Noted: No Dry / Scaly: No Temperature / Pain Maceration: No Temperature: No Abnormality Tenderness on Palpation: Yes Wound Preparation Ulcer Cleansing: Rinsed/Irrigated with Saline Topical Anesthetic Applied: Other: lidocaine 4%, Treatment Notes Wound #5 (Medial Sacrum) 1. Cleansed with: Clean wound with Normal Saline 2. Anesthetic Topical Lidocaine 4% cream to wound bed prior to debridement 3. Peri-wound Care: Skin Prep 4. Dressing Applied: Other dressing (specify in notes) 5. Secondary Dressing Applied Bordered Foam Dressing Notes silvercel Electronic Signature(s) Signed: 03/25/2017 9:47:23 AM By: Alric Quan Entered By: Alric Quan on 03/24/2017 11:17:45 Bulger, Charlisha ([id number]) -------------------------------------------------------------------------------- Wound Assessment Details Patient Name: [name] Date of Service: 03/24/2017 10:30 AM Medical Record Number: [id number] Patient Account Number: [id number] Date of Birth/Sex: [birth year] (83 y.o. Female) Treating RN: Carolyne Fiscal, Debi Primary Care Bynum Mccullars: [name] Other Clinician: Referring Chadric Kimberley: [name] Treating Brenten Janney/Extender: Frann Rider in Treatment: 0 Wound Status Wound Number: 6 Primary Pressure Ulcer Etiology: Wound Location: Right Sacrum Wound Open Wounding Event: Pressure Injury Status: Date Acquired: 03/09/2017 Comorbid Glaucoma, Hypertension, Type II Weeks Of Treatment: 0 History: Diabetes, End Stage Renal Disease, Clustered Wound:  No Osteoarthritis Photos Photo Uploaded By: Alric Quan on 03/25/2017 09:55:57 Wound Measurements Length: (cm) 0.5 Width: (cm) 2.8 Depth: (cm) 0.1 Area: (cm) 1.1 Volume: (cm) 0.11 % Reduction in Area: 0% % Reduction in Volume: 0% Epithelialization: None Tunneling: No Undermining: No Wound Description Classification: Category/Stage III Wound Margin: Flat and Intact Exudate Amount: Large Exudate Type: Serous Exudate Color: amber Foul Odor After Cleansing: No Slough/Fibrino No Wound Bed Granulation Amount: Small (1-33%) Exposed Structure Granulation Quality: Red, Pink Fascia Exposed: No Necrotic Amount: Large (67-100%) Fat Layer (Subcutaneous Tissue) Exposed: No Necrotic Quality: Eschar Tendon Exposed: No Heffler, Whisper ([id number]) Muscle Exposed: No Joint Exposed: No Bone Exposed: No Periwound Skin Texture Texture Color No Abnormalities Noted: No No Abnormalities Noted: No Callus: No Atrophie Blanche: No Crepitus: No Cyanosis: No Excoriation: No Ecchymosis: No Induration: No Erythema: No Rash: No Hemosiderin Staining: No Scarring: No Mottled: No Pallor: No Moisture Rubor: No No Abnormalities Noted: No Dry / Scaly: No Temperature / Pain Maceration: No Tenderness on Palpation: Yes Wound Preparation Ulcer Cleansing: Rinsed/Irrigated with Saline Topical Anesthetic Applied: Other: lidocaine 4%, Treatment Notes Wound #6 (Right Sacrum) 1. Cleansed with: Clean wound with Normal Saline 2. Anesthetic Topical Lidocaine 4% cream to wound bed prior to debridement 3. Peri-wound Care: Skin Prep 4. Dressing Applied: Other dressing (specify in notes) 5. Secondary Dressing Applied Bordered Foam Dressing Notes silvercel Electronic Signature(s) Signed: 03/25/2017 9:47:23 AM By: Alric Quan Entered By: Alric Quan on 03/24/2017 11:29:22 [name]  ([id number]) -------------------------------------------------------------------------------- Wound Assessment Details Patient Name: [name] Date of Service: 03/24/2017 10:30 AM Medical Record Number: [id number] Patient Account Number: [id number] Date of Birth/Sex: 03-08-1922 (83 y.o. Female) Treating RN: Carolyne Fiscal, Debi Primary Care Syrina Wake: [name] Other Clinician: Referring Kataleah Bejar: [name] Treating Kortney Potvin/Extender: Frann Rider in Treatment: 0 Wound Status Wound Number: 7 Primary Pressure Ulcer Etiology: Wound Location: Right Gluteus Wound Open Wounding Event: Pressure Injury Status: Date Acquired: 03/09/2017 Comorbid Glaucoma, Hypertension, Type II Weeks Of Treatment: 0 History: Diabetes, End Stage Renal Disease, Clustered Wound: No Osteoarthritis  Photos Photo Uploaded By: Alric Quan on 03/25/2017 09:55:17 Wound Measurements Length: (cm) 0.8 Width: (cm) 1 Depth: (cm) 0.1 Area: (cm) 0.628 Volume: (cm) 0.063 % Reduction in Area: 0% % Reduction in Volume: 0% Epithelialization: None Tunneling: No Undermining: No Wound Description Classification: Category/Stage III Wound Margin: Flat and Intact Exudate Amount: Large Exudate Type: Serous Exudate Color: amber Foul Odor After Cleansing: No Slough/Fibrino No Wound Bed Granulation Amount: Large (67-100%) Exposed Structure Granulation Quality: Red Fascia Exposed: No Necrotic Amount: Small (1-33%) Fat Layer (Subcutaneous Tissue) Exposed: Yes Necrotic Quality: Adherent Slough Tendon Exposed: No Forman, Nashali ([id number]) Muscle Exposed: No Joint Exposed: No Bone Exposed: No Periwound Skin Texture Texture Color No Abnormalities Noted: No No Abnormalities Noted: No Callus: No Atrophie Blanche: No Crepitus: No Cyanosis: No Excoriation: No Ecchymosis: No Induration: No Erythema: No Rash: No Hemosiderin Staining: No Scarring: No Mottled: No Pallor: No Moisture Rubor: No No  Abnormalities Noted: No Dry / Scaly: No Temperature / Pain Maceration: No Tenderness on Palpation: Yes Wound Preparation Ulcer Cleansing: Rinsed/Irrigated with Saline Topical Anesthetic Applied: Other: lidocaine 4%, Electronic Signature(s) Signed: 03/25/2017 9:47:23 AM By: Alric Quan Entered By: Alric Quan on 03/24/2017 11:19:34 Hang, Tequlia ([id number]) -------------------------------------------------------------------------------- Vitals Details Patient Name: [name] Date of Service: 03/24/2017 10:30 AM Medical Record Number: [id number] Patient Account Number: [id number] Date of Birth/Sex: 02-04-1922 (83 y.o. Female) Treating RN: Carolyne Fiscal, Debi Primary Care Darick Fetters: [name] Other Clinician: Referring Zaveon Gillen: [name] Treating Aneisha Skyles/Extender: Frann Rider in Treatment: 0 Vital Signs Time Taken: 10:33 Temperature (F): 97.5 Height (in): 60 Pulse (bpm): 108 Source: Stated Respiratory Rate (breaths/min): 16 Weight (lbs): 157 Blood Pressure (mmHg): 154/52 Source: Measured Reference Range: 80 - 120 mg / dl Body Mass Index (BMI): 30.7 Electronic Signature(s) Signed: 03/25/2017 9:47:23 AM By: Alric Quan Entered By: Alric Quan on 03/24/2017 10:33:54

## 2017-03-26 NOTE — Progress Notes (Signed)
[name], [name] ([id number]) Visit Report for 03/24/2017 Chief Complaint Document Details Patient Name: [name] 03/24/2017 10:30 Date of Service: AM Medical Record [id number] Number: Patient Account Number: [id number] 1922-05-27 (83 y.o. Treating RN: [name] Date of Birth/Sex: Female) Other Clinician: Primary Care Provider: [name] Treating [name] Referring Provider: [name] Provider/Extender: [name] in Treatment: 0 Information Obtained from: Patient Chief Complaint Patient returns to the wound care center for new ulcer to: the sacral region, right buttock, left medial ankle left medial calf, which she's had for about 2 weeks now Electronic Signature(s) Signed: 03/24/2017 11:49:24 AM By: Christin Fudge MD, FACS Entered By: Christin Fudge on 03/24/2017 11:49:23 [name], [name] ([id number]) -------------------------------------------------------------------------------- HPI Details Patient Name: [name] 03/24/2017 10:30 Date of Service: AM Medical Record [id number] Number: Patient Account Number: [id number] 13-Feb-1922 (83 y.o. Treating RN: [name] Date of Birth/Sex: Female) Other Clinician: Primary Care Provider: [name] Treating [name] Referring Provider: [name] Provider/Extender: [name] in Treatment: 0 History of Present Illness Location: sacral region,right buttock, right ischial tuberosity,left medial malleolus and left lower extremity in the region of the calf Quality: Patient reports experiencing a dull pain to affected area(s). Severity: Patient states wound are getting worse. Duration: Patient has had the wound for > 2 weeks prior to seeking treatment at the wound center Timing: Pain in wound is Intermittent (comes and goes Context: The wound appeared gradually over time Modifying Factors: Other treatment(s) tried include:seen the vascular surgeon recently and has also had a procedure done in April 2018 Associated Signs  and Symptoms: Patient reports having difficulty standing for long periods. HPI Description: 83 year old patient who was last seen by [place] in December 2017 for a sacral decubitus ulcer, Was recently seen by her PCP Dr. Lacinda Axon for an ongoing wound of her left lower extremity. She had 2 wounds one on the medial malleolus and one the left calf area She had seen vascular surgery and no intervention has been recommended at this time. the patient also has edema of both lower extremities and upper extremities. She's been treated for her diabetes with insulin approximately 2 times a week. last hemoglobin A1c was 6.4% she had lower arterial studies done by Dr. dew and the right side was noncompressible with biphasic 4 and a digital toe pressure was 0.73. On the left side she had monophasic flow with her ABI being 0.56 and her toe pressure being 0.22. Dr. 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 she had a procedure done in April 2018 with aortogram and selective left lower extremity angiogram and angioplasty. he recommended local wound care and a repeat ABIs in 6 months Addendum -- Dr. Lacinda Axon has left the practice, but I managed to talk to his nurse practitioner [name], and I discussed the nature of the patient's wound and the fact that she may soon need either, an above- knee amputation or if the family decides not to do aggressive treatment she may end up with palliative care or hospice care. ======== Old notes 83 year old patient who was seen recently by her PCP Dr. Lacinda Axon who has been treating her for a decubitus ulcer of the sacral region, diabetes mellitus type 2, weight loss, essential hypertension. her past history is also significant for breast cancer, stroke and peripheral vascular disease. She had a vascular workup done in May 2017 where to do saw her for her peripheral arterial disease with rest pain in the left lower extremity and did an  aortogram and selective left lower extremity angiogram. the findings were that of occlusion of the bypass which terminated in either the posterior tibial artery which was excluded or a posterior tibial artery with poor runoff. Native SFA was occluded with reconstitution of a [name] ([id number]) diseased popliteal artery with the peroneal artery being best runoff distally it appears the bypass may have been based on the native SFA although it was not entirely clear. last hemoglobin A1c in March of this year was 7.1 Old note: 83 year old female with a past medical history of type 2 diabetes, hypertension, hyperlipidemia, history of breast cancer (right) and current cancer of the left breast, osteoarthritis, and glaucoma presents to the wound clinic today a consult of a wound on her sacral region. This has been there for about 3 months now. Her last hemoglobin A1c was 6.4 and her glucose was 184. She is not bed bound and is ambulating with help and uses a walker and sits on a lift chair for most of the day. 06-03-16 [name] presents today, accompanied by her daughter, for evaluation of her stage III sacral pressure ulcer. Her daughter states there have been no changes in her overall condition: No decrease in appetite, no weight changes, no medication changes, no changes in incontinence, no activity changes. [name] states that she has been doing better with her protein intake. Her daughter states that they did receive a call from [employer] last week but no one has shown up for evaluation. We will follow-up with this. ========== Electronic Signature(s) Signed: 03/24/2017 1:05:39 PM By: Christin Fudge MD, FACS Previous Signature: 03/24/2017 11:51:03 AM Version By: Christin Fudge MD, FACS Previous Signature: 03/24/2017 11:16:05 AM Version By: Christin Fudge MD, FACS Previous Signature: 03/24/2017 11:15:12 AM Version By: Christin Fudge MD, FACS Previous Signature: 03/24/2017 11:09:23 AM  Version By: Christin Fudge MD, FACS Entered By: Christin Fudge on 03/24/2017 13:05:38 [name], [name] ([id number]) -------------------------------------------------------------------------------- Physical Exam Details Patient Name: ALEILA, SYVERSON 03/24/2017 10:30 Date of Service: AM Medical Record [id number] Number: Patient Account Number: [id number] April 27, 1922 (83 y.o. Treating RN: [name] Date of Birth/Sex: Female) Other Clinician: Primary Care Provider: [name] Treating Andrena Margerum Referring Provider: [name] Provider/Extender: Weeks in Treatment: 0 Constitutional . Pulse regular. Respirations normal and unlabored. Afebrile. . Eyes Nonicteric. Reactive to light. Ears, Nose, Mouth, and Throat Lips, teeth, and gums WNL.Marland Kitchen Moist mucosa without lesions. Neck supple and nontender. No palpable supraclavicular or cervical adenopathy. Normal sized without goiter. Respiratory WNL. No retractions.. Cardiovascular Pedal Pulses WNL. recent ABIs have been noted as per the vascular note. No clubbing, cyanosis or edema. Chest Breasts symmetical and no nipple discharge.. Breast tissue WNL, no masses, lumps, or tenderness.. Gastrointestinal (GI) Abdomen without masses or tenderness.. No liver or spleen enlargement or tenderness.. Lymphatic No adneopathy. No adenopathy. No adenopathy. Musculoskeletal Adexa without tenderness or enlargement.. Digits and nails w/o clubbing, cyanosis, infection, petechiae, ischemia, or inflammatory conditions.. Integumentary (Hair, Skin) No suspicious lesions. No crepitus or fluctuance. No peri-wound warmth or erythema. No masses.Marland Kitchen Psychiatric Judgement and insight Intact.. No evidence of depression, anxiety, or agitation.. Notes patient has stage III pressure injuries on the sacral region right upper buttock and right ischial tuberosity. none of these need a checkup debridement The left medial calf has necrosis of skin and subcutaneous  interstitial and muscle and there is evidence of localized purulent drainage but no cellulitis. The left medial ankle also has necrosis of skin and subcutis tissue down to the fascia over the medial malleolus [name],  [name] ([id number]) Due to her poor vascularity no sharp dissection was attempted. We will use Santyl on the left lower extremity wounds Electronic Signature(s) Signed: 03/24/2017 11:53:41 AM By: Christin Fudge MD, FACS Entered By: Christin Fudge on 03/24/2017 11:53:40 Halliday, [name] ([id number]) -------------------------------------------------------------------------------- Physician Orders Details Patient Name: [name], [name] 03/24/2017 10:30 Date of Service: AM Medical Record [id number] Number: Patient Account Number: [id number] Aug 18, 1921 (83 y.o. Treating RN: [name] Date of Birth/Sex: Female) Other Clinician: Primary Care Provider: [name] Treating Randie Bloodgood Referring Provider: [name] Provider/Extender: [name] in Treatment: 0 Verbal / Phone Orders: Yes ClinicianCarolyne Fiscal, Debi Read Back and Verified: Yes Diagnosis Coding ICD-10 Coding Code Description E11.622 Type 2 diabetes mellitus with other skin ulcer I73.9 Peripheral vascular disease, unspecified L97.322 Non-pressure chronic ulcer of left ankle with fat layer exposed Wound Cleansing Wound #3 Left,Medial Malleolus o Clean wound with Normal Saline. o Cleanse wound with mild soap and water o May Shower, gently pat wound dry prior to applying new dressing. Wound #4 Left,Anterior Lower Leg o Clean wound with Normal Saline. o Cleanse wound with mild soap and water o May Shower, gently pat wound dry prior to applying new dressing. Wound #5 Medial Sacrum o Clean wound with Normal Saline. o Cleanse wound with mild soap and water o May Shower, gently pat wound dry prior to applying new dressing. Wound #6 Right Sacrum o Clean wound with Normal Saline. o Cleanse wound with  mild soap and water o May Shower, gently pat wound dry prior to applying new dressing. Wound #7 Right Ischial Tuberosity o Clean wound with Normal Saline. o Cleanse wound with mild soap and water o May Shower, gently pat wound dry prior to applying new dressing. Anesthetic Wound #3 Left,Medial Malleolus o Topical Lidocaine 4% cream applied to wound bed prior to debridement - for clinic use JIMMYE, WISNIESKI ([id number]) Wound #4 Left,Anterior Lower Leg o Topical Lidocaine 4% cream applied to wound bed prior to debridement - for clinic use Wound #5 Medial Sacrum o Topical Lidocaine 4% cream applied to wound bed prior to debridement - for clinic use Wound #6 Right Sacrum o Topical Lidocaine 4% cream applied to wound bed prior to debridement - for clinic use Wound #7 Right Ischial Tuberosity o Topical Lidocaine 4% cream applied to wound bed prior to debridement - for clinic use Skin Barriers/Peri-Wound Care Wound #5 Medial Sacrum o Skin Prep Wound #6 Right Sacrum o Skin Prep Wound #7 Right Ischial Tuberosity o Skin Prep Primary Wound Dressing Wound #3 Left,Medial Malleolus o Santyl Ointment Wound #4 Left,Anterior Lower Leg o Santyl Ointment Wound #5 Medial Sacrum o [place] Non-Adherent Wound #6 Right Sacrum o [place]cel Non-Adherent Wound #7 Right Ischial Tuberosity o [place] Non-Adherent Secondary Dressing Wound #3 Left,Medial Malleolus o ABD pad o Dry Gauze o Conform/Kerlix - do not wrap tight o Other - stretch netting #5 Wound #4 Left,Anterior Lower Leg o ABD pad [name], [name] ([id number]) o Dry Gauze o Conform/Kerlix - do not wrap tight o Other - stretch netting #5 Wound #5 Medial Sacrum o Boardered Foam Dressing Wound #6 Right Sacrum o Boardered Foam Dressing Wound #7 Right Ischial Tuberosity o Boardered Foam Dressing Dressing Change Frequency Wound #3 Left,Medial Malleolus o Change dressing every  day. Wound #4 Left,Anterior Lower Leg o Change dressing every day. Wound #5 Medial Sacrum o Change dressing every other day. Wound #6 Right Sacrum o Change dressing every other day. Wound #7 Right Ischial Tuberosity o Change dressing every other day. Follow-up Appointments Wound #3  Left,Medial Malleolus o Return Appointment in 1 week. Wound #4 Left,Anterior Lower Leg o Return Appointment in 1 week. Wound #5 Medial Sacrum o Return Appointment in 1 week. Wound #6 Right Sacrum o Return Appointment in 1 week. Wound #7 Right Ischial Tuberosity o Return Appointment in 1 week. Edema Control Wound #3 Left,Medial Malleolus o Elevate legs to the level of the heart and pump ankles as often as possible [name], [name] ([id number]) Wound #4 Left,Anterior Lower Leg o Elevate legs to the level of the heart and pump ankles as often as possible Off-Loading Wound #3 Left,Medial Malleolus o Turn and reposition every 2 hours o Other: - float heels while lying in bed or sitting Wound #4 Left,Anterior Lower Leg o Turn and reposition every 2 hours o Other: - float heels while lying in bed or sitting Wound #5 Medial Sacrum o Turn and reposition every 2 hours o Other: - float heels while lying in bed or sitting Wound #6 Right Sacrum o Turn and reposition every 2 hours o Other: - float heels while lying in bed or sitting Wound #7 Right Ischial Tuberosity o Turn and reposition every 2 hours o Other: - float heels while lying in bed or sitting Additional Orders / Instructions Wound #3 Left,Medial Malleolus o Increase protein intake. Wound #4 Left,Anterior Lower Leg o Increase protein intake. Wound #5 Medial Sacrum o Increase protein intake. Wound #6 Right Sacrum o Increase protein intake. Wound #7 Right Ischial Tuberosity o Increase protein intake. Home Health Wound #3 [place]  Nurse may visit PRN to address patientos wound care needs. o FACE TO FACE ENCOUNTER: MEDICARE and MEDICAID PATIENTS: I certify that this patient is under my care and that I had a face-to-face encounter that meets the physician face-to-face encounter requirements with this patient on this date. The encounter with the patient was in [name], [name] ([id number]) whole or in part for the following MEDICAL CONDITION: (primary reason for [place]) MEDICAL NECESSITY: I certify, that based on my findings, NURSING services are a medically necessary home health service. HOME BOUND STATUS: I certify that my clinical findings support that this patient is homebound (i.e., Due to illness or injury, pt requires aid of supportive devices such as crutches, cane, wheelchairs, walkers, the use of special transportation or the assistance of another person to leave their place of residence. There is a normal inability to leave the home and doing so requires considerable and taxing effort. Other absences are for medical reasons / religious services and are infrequent or of short duration when for other reasons). o If current dressing causes regression in wound condition, may D/C ordered dressing product/s and apply Normal Saline Moist Dressing daily until next [place] / Other MD appointment. [place] of regression in wound condition at [phone]. o Please direct any NON-WOUND related issues/requests for orders to patient's Primary Care Physician Wound #4 [place] Nurse may visit PRN to address patientos wound care needs. o FACE TO FACE ENCOUNTER: MEDICARE and MEDICAID PATIENTS: I certify that this patient is under my care and that I had a face-to-face encounter that meets the physician face-to-face encounter requirements with this patient on this date. The encounter with the patient was in whole or in part  for the following MEDICAL CONDITION: (primary reason for [place]) MEDICAL NECESSITY: I certify, that based on my findings, NURSING services are a medically necessary  home health service. HOME BOUND STATUS: I certify that my clinical findings support that this patient is homebound (i.e., Due to illness or injury, pt requires aid of supportive devices such as crutches, cane, wheelchairs, walkers, the use of special transportation or the assistance of another person to leave their place of residence. There is a normal inability to leave the home and doing so requires considerable and taxing effort. Other absences are for medical reasons / religious services and are infrequent or of short duration when for other reasons). o If current dressing causes regression in wound condition, may D/C ordered dressing product/s and apply Normal Saline Moist Dressing daily until next [place] / Other MD appointment. [place] of regression in wound condition at [phone]. o Please direct any NON-WOUND related issues/requests for orders to patient's Primary Care Physician Wound #5 Medial [place] Nurse may visit PRN to address patientos wound care needs. o FACE TO FACE ENCOUNTER: MEDICARE and MEDICAID PATIENTS: I certify that this patient is under my care and that I had a face-to-face encounter that meets the physician face-to-face encounter requirements with this patient on this date. The encounter with the patient was in whole or in part for the following MEDICAL CONDITION: (primary reason for [place]) MEDICAL NECESSITY: I certify, that based on my findings, NURSING services are a medically necessary home health service. HOME BOUND STATUS: I certify that my clinical findings support that this patient is homebound (i.e., Due to illness or injury, pt requires aid of supportive devices such as crutches, cane,  wheelchairs, walkers, the use of special transportation or the assistance of another person to leave their place of residence. There is a normal inability to leave the home and doing so requires considerable and taxing effort. ARABELLA, REVELLE ([id number]) absences are for medical reasons / religious services and are infrequent or of short duration when for other reasons). o If current dressing causes regression in wound condition, may D/C ordered dressing product/s and apply Normal Saline Moist Dressing daily until next [place] / Other MD appointment. [place] of regression in wound condition at [phone]. o Please direct any NON-WOUND related issues/requests for orders to patient's Primary Care Physician Wound #6 Right [place] Nurse may visit PRN to address patientos wound care needs. o FACE TO FACE ENCOUNTER: MEDICARE and MEDICAID PATIENTS: I certify that this patient is under my care and that I had a face-to-face encounter that meets the physician face-to-face encounter requirements with this patient on this date. The encounter with the patient was in whole or in part for the following MEDICAL CONDITION: (primary reason for [place]) MEDICAL NECESSITY: I certify, that based on my findings, NURSING services are a medically necessary home health service. HOME BOUND STATUS: I certify that my clinical findings support that this patient is homebound (i.e., Due to illness or injury, pt requires aid of supportive devices such as crutches, cane, wheelchairs, walkers, the use of special transportation or the assistance of another person to leave their place of residence. There is a normal inability to leave the home and doing so requires considerable and taxing effort. Other absences are for medical reasons / religious services and are infrequent or of short duration when for other reasons). o If  current dressing causes regression in wound condition, may D/C ordered dressing product/s and apply Normal Saline Moist Dressing daily until [facility] Other MD appointment. [place] of regression in wound condition at [phone]. o Please direct any NON-WOUND related issues/requests for orders to patient's Primary Care Physician Wound #7 Right Ischial [place] Nurse may visit PRN to address patientos wound care needs. o FACE TO FACE ENCOUNTER: MEDICARE and MEDICAID PATIENTS: I certify that this patient is under my care and that I had a face-to-face encounter that meets the physician face-to-face encounter requirements with this patient on this date. The encounter with the patient was in whole or in part for the following MEDICAL CONDITION: (primary reason for [place]) MEDICAL NECESSITY: I certify, that based on my findings, NURSING services are a medically necessary home health service. HOME BOUND STATUS: I certify that my clinical findings support that this patient is homebound (i.e., Due to illness or injury, pt requires aid of supportive devices such as crutches, cane, wheelchairs, walkers, the use of special transportation or the assistance of another person to leave their place of residence. There is a normal inability to leave the home and doing so requires considerable and taxing effort. Other absences are for medical reasons / religious services and are infrequent or of short duration when for other reasons). o If current dressing causes regression in wound condition, may D/C ordered dressing product/s and apply Normal Saline Moist Dressing daily until next [place] / Other MD appointment. [place] of regression in wound condition at [phone]. o Please direct any NON-WOUND related issues/requests for orders to patient's Primary  Care Physician CHRISA, HASSAN ([id number]) Medications-please add to medication list. Wound #3 Left,Medial Malleolus o Other: - Vitamin C, Zinc, Vitamin A Wound #4 Left,Anterior Lower Leg o Other: - Vitamin C, Zinc, Vitamin A Wound #5 Medial Sacrum o Other: - Vitamin C, Zinc, Vitamin A Wound #6 Right Sacrum o Other: - Vitamin C, Zinc, Vitamin A Wound #7 Right Ischial Tuberosity o Other: - Vitamin C, Zinc, Vitamin A Electronic Signature(s) Signed: 03/25/2017 8:31:58 AM By: Christin Fudge MD, FACS Signed: 03/25/2017 9:47:23 AM By: Alric Quan Entered By: Alric Quan on 03/24/2017 11:44:28 [name], [name] ([id number]) -------------------------------------------------------------------------------- Problem List Details Patient Name: [name], [name] 03/24/2017 10:30 Date of Service: AM Medical Record [id number] Number: Patient Account Number: [id number] Oct 28, 1921 (83 y.o. 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 of 03/24/2017 Yes muscle L89.153 Pressure ulcer of sacral region, stage 3 03/24/2017 Yes L89.313 Pressure ulcer of right buttock, stage 3 03/24/2017 Yes I70.242 Atherosclerosis of native arteries of left leg with ulceration 03/24/2017 Yes of calf Inactive Problems Resolved Problems Electronic Signature(s) Signed: 03/24/2017 11:48:15 AM By: Christin Fudge MD, FACS Previous Signature: 03/24/2017 11:17:05 AM Version By: Christin Fudge MD,  FACS [name], [name] ([id number]) Entered By: Christin Fudge on 03/24/2017 11:48:15 [name], [name] ([id number]) -------------------------------------------------------------------------------- Progress Note Details Patient Name: ARTI, TRANG 03/24/2017 10:30 Date of Service: AM Medical Record [id number] Number: Patient Account Number: [id number] 1921-07-11 (83 y.o. Treating RN: [name] Date of Birth/Sex: Female) Other Clinician: Primary Care Provider: [name] Treating Jameica Couts Referring Provider: [name] Provider/Extender:  Weeks in Treatment: 0 Subjective Chief Complaint Information obtained from Patient Patient returns to the wound care center for new ulcer to: the sacral region, right buttock, left medial ankle left medial calf, which she's had for about 2 weeks now History of Present Illness (HPI) The following HPI elements were documented for the patient's wound: Location: sacral region,right buttock, right ischial tuberosity,left medial malleolus and left lower extremity in the region of the calf Quality: Patient reports experiencing a dull pain to affected area(s). Severity: Patient states wound are getting worse. Duration: Patient has had the wound for > 2 weeks prior to seeking treatment at the wound center Timing: Pain in wound is Intermittent (comes and goes Context: The wound appeared gradually over time Modifying Factors: Other treatment(s) tried include:seen the vascular surgeon recently and has also had a procedure done in April 2018 Associated Signs and Symptoms: Patient reports having difficulty standing for long periods. 83 year old patient who was last seen by [place] in December 2017 for a sacral decubitus ulcer, Was recently seen by her PCP Dr. Lacinda Axon for an ongoing wound of her left lower extremity. She had 2 wounds one on the medial malleolus and one the left calf area She had seen vascular surgery and no intervention has been recommended at this  time. the patient also has edema of both lower extremities and upper extremities. She's been treated for her diabetes with insulin approximately 2 times a week. last hemoglobin A1c was 6.4% she had lower arterial studies done by Dr. dew and the right side was noncompressible with biphasic 4 and a digital toe pressure was 0.73. On the left side she had monophasic flow with her ABI being 0.56 and her toe pressure being 0.22. Dr. 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 she had a procedure done in April 2018 with aortogram and selective left lower extremity angiogram and angioplasty. he recommended local wound care and a repeat ABIs in 6 months Addendum -- Dr. Lacinda Axon has left the practice, but I managed to talk to his nurse practitioner [name], and I discussed the nature of the patient's wound and the fact that she may soon need either, an above- knee amputation or if the family decides not to do aggressive treatment she may end up with palliative care or hospice care. IRIS, HAIRSTON ([id number]) ======== Old notes 83 year old patient who was seen recently by her PCP Dr. Lacinda Axon who has been treating her for a decubitus ulcer of the sacral region, diabetes mellitus type 2, weight loss, essential hypertension. her past history is also significant for breast cancer, stroke and peripheral vascular disease. She had a vascular workup done in May 2017 where to do saw her for her peripheral arterial disease with rest pain in the left lower extremity and did an aortogram and selective left lower extremity angiogram. the findings were that of occlusion of the bypass which terminated in either the posterior tibial artery which was excluded or a posterior tibial artery with poor runoff. Native SFA was occluded with reconstitution of a diseased popliteal artery with the peroneal artery being best runoff distally it appears the bypass may have been based on the  native SFA although it was not entirely clear. last hemoglobin A1c in March of this year was 7.1 Old note: 83 year old female with a past medical history of type 2 diabetes, hypertension, hyperlipidemia, history of breast cancer (right) and current cancer of the left breast, osteoarthritis, and glaucoma presents to  the wound clinic today a consult of a wound on her sacral region. This has been there for about 3 months now. Her last hemoglobin A1c was 6.4 and her glucose was 184. She is not bed bound and is ambulating with help and uses a walker and sits on a lift chair for most of the day. 06-03-16 Ms. Macfadden presents today, accompanied by her daughter, for evaluation of her stage III sacral pressure ulcer. Her daughter states there have been no changes in her overall condition: No decrease in appetite, no weight changes, no medication changes, no changes in incontinence, no activity changes. Ms. Montone states that she has been doing better with her protein intake. Her daughter states that they did receive a call from [employer] last week but no one has shown up for evaluation. We will follow-up with this. ========== Wound History Patient presents with 6 open wounds that have been present for approximately 2 weeks. Patient has been treating wounds in the following manner: santyl. Laboratory tests have not been performed in the last month. Patient reportedly has not tested positive for an antibiotic resistant organism. Patient reportedly has not tested positive for osteomyelitis. Patient reportedly has had testing performed to evaluate circulation in the legs. Patient experiences the following problems associated with their wounds: swelling. Patient History Information obtained from Patient. Allergies No Known Allergies Family History Diabetes - Child, Heart Disease - Mother, Hypertension - Mother, No family history of Cancer, Hereditary Spherocytosis, Kidney Disease, Lung Disease, Seizures,  Stroke, Thyroid Problems, Tuberculosis. Social History Never smoker, Marital Status - Widowed, Alcohol Use - Never, Drug Use - No History, Caffeine Use - Never. AVANGELINE, STOCKBURGER ([id number]) Medical And Surgical History Notes Cardiovascular hyperlipidemia Oncologic hx breast cancer s/p right mastectomy over 10 years ago Review of Systems (ROS) Constitutional Symptoms (General Health) The patient has no complaints or symptoms. Objective Constitutional Pulse regular. Respirations normal and unlabored. Afebrile. Vitals Time Taken: 10:33 AM, Height: 60 in, Source: Stated, Weight: 157 lbs, Source: Measured, BMI: 30.7, Temperature: 97.5 F, Pulse: 108 bpm, Respiratory Rate: 16 breaths/min, Blood Pressure: 154/52 mmHg. Eyes Nonicteric. Reactive to light. Ears, Nose, Mouth, and Throat Lips, teeth, and gums WNL.Marland Kitchen Moist mucosa without lesions. Neck supple and nontender. No palpable supraclavicular or cervical adenopathy. Normal sized without goiter. Respiratory WNL. No retractions.. Cardiovascular Pedal Pulses WNL. recent ABIs have been noted as per the vascular note. No clubbing, cyanosis or edema. Chest Breasts symmetical and no nipple discharge.. Breast tissue WNL, no masses, lumps, or tenderness.. Gastrointestinal (GI) Abdomen without masses or tenderness.. No liver or spleen enlargement or tenderness.. Lymphatic Horsey, Jissell ([id number]) No adneopathy. No adenopathy. No adenopathy. Musculoskeletal Adexa without tenderness or enlargement.. Digits and nails w/o clubbing, cyanosis, infection, petechiae, ischemia, or inflammatory conditions.Marland Kitchen Psychiatric Judgement and insight Intact.. No evidence of depression, anxiety, or agitation.. General Notes: patient has stage III pressure injuries on the sacral region right upper buttock and right ischial tuberosity. none of these need a checkup debridement The left medial calf has necrosis of skin and subcutaneous interstitial and muscle and  there is evidence of localized purulent drainage but no cellulitis. The left medial ankle also has necrosis of skin and subcutis tissue down to the fascia over the medial malleolus Due to her poor vascularity no sharp dissection was attempted. We will use Santyl on the left lower extremity wounds Integumentary (Hair, Skin) No suspicious lesions. No crepitus or fluctuance. No peri-wound warmth or erythema. No masses.. Wound #3 status is Open. Original cause of wound  was Gradually Appeared. The wound is located on the Left,Medial Malleolus. The wound measures 1.8cm length x 2.3cm width x 0.4cm depth; 3.252cm^2 area and 1.301cm^3 volume. There is no tunneling or undermining noted. There is a large amount of serosanguineous drainage noted. Foul odor after cleansing was noted. The wound margin is distinct with the outline attached to the wound base. There is medium (34-66%) red, pink granulation within the wound bed. There is a medium (34-66%) amount of necrotic tissue within the wound bed including Eschar and Adherent Slough. The periwound skin appearance did not exhibit: Callus, Crepitus, Excoriation, Induration, Rash, Scarring, Dry/Scaly, Maceration, Atrophie Blanche, Cyanosis, Ecchymosis, Hemosiderin Staining, Mottled, Pallor, Rubor, Erythema. Wound #4 status is Open. Original cause of wound was Gradually Appeared. The wound is located on the Left,Anterior Lower Leg. The wound measures 8cm length x 2.4cm width x 0.9cm depth; 15.08cm^2 area and 13.572cm^3 volume. There is tendon, Fat Layer (Subcutaneous Tissue) Exposed, and fascia exposed. There is no tunneling or undermining noted. There is a large amount of serosanguineous drainage noted. Foul odor after cleansing was noted. The wound margin is distinct with the outline attached to the wound base. There is small (1-33%) red, pink granulation within the wound bed. There is a large (67-100%) amount of necrotic tissue within the wound bed including  Eschar and Adherent Slough. The periwound skin appearance did not exhibit: Callus, Crepitus, Excoriation, Induration, Rash, Scarring, Dry/Scaly, Maceration, Atrophie Blanche, Cyanosis, Ecchymosis, Hemosiderin Staining, Mottled, Pallor, Rubor, Erythema. Periwound temperature was noted as No Abnormality. The periwound has tenderness on palpation. Wound #5 status is Open. Original cause of wound was Pressure Injury. The wound is located on the Medial Sacrum. The wound measures 2.3cm length x 2cm width x 0.1cm depth; 3.613cm^2 area and 0.361cm^3 volume. There is Fat Layer (Subcutaneous Tissue) Exposed exposed. There is no tunneling or undermining noted. There is a large amount of serous drainage noted. The wound margin is flat and intact. There is small (1-33%) pink granulation within the wound bed. There is a large (67-100%) amount of necrotic tissue within the wound bed including Eschar and Adherent Slough. The periwound skin appearance did not exhibit: Callus, Crepitus, Excoriation, Induration, Rash, Scarring, Dry/Scaly, Maceration, Atrophie Blanche, Cyanosis, Ecchymosis, Hemosiderin Staining, Mottled, Pallor, Rubor, Erythema. Periwound temperature was noted as No Abnormality. The periwound has tenderness on palpation. HELOISE, GORDAN ([id number]) Wound #6 status is Open. Original cause of wound was Pressure Injury. The wound is located on the Right Sacrum. The wound measures 0.5cm length x 2.8cm width x 0.1cm depth; 1.1cm^2 area and 0.11cm^3 volume. There is no tunneling or undermining noted. There is a large amount of serous drainage noted. The wound margin is flat and intact. There is small (1-33%) red, pink granulation within the wound bed. There is a large (67-100%) amount of necrotic tissue within the wound bed including Eschar. The periwound skin appearance did not exhibit: Callus, Crepitus, Excoriation, Induration, Rash, Scarring, Dry/Scaly, Maceration, Atrophie Blanche, Cyanosis, Ecchymosis,  Hemosiderin Staining, Mottled, Pallor, Rubor, Erythema. The periwound has tenderness on palpation. Wound #7 status is Open. Original cause of wound was Pressure Injury. The wound is located on the Right Ischial Tuberosity. The wound measures 0.8cm length x 1cm width x 0.1cm depth; 0.628cm^2 area and 0.063cm^3 volume. There is Fat Layer (Subcutaneous Tissue) Exposed exposed. There is no tunneling or undermining noted. There is a large amount of serous drainage noted. The wound margin is flat and intact. There is large (67-100%) red granulation within the wound bed. There is  a small (1-33%) amount of necrotic tissue within the wound bed including Adherent Slough. The periwound skin appearance did not exhibit: Callus, Crepitus, Excoriation, Induration, Rash, Scarring, Dry/Scaly, Maceration, Atrophie Blanche, Cyanosis, Ecchymosis, Hemosiderin Staining, Mottled, Pallor, Rubor, Erythema. The periwound has tenderness on palpation. Assessment Active Problems ICD-10 E11.622 - Type 2 diabetes mellitus with other skin ulcer I73.9 - Peripheral vascular disease, unspecified L97.223 - Non-pressure chronic ulcer of left calf with necrosis of muscle L97.323 - Non-pressure chronic ulcer of left ankle with necrosis of muscle L89.153 - Pressure ulcer of sacral region, stage 3 L89.313 - Pressure ulcer of right buttock, stage 3 I70.242 - Atherosclerosis of native arteries of left leg with ulceration of calf this 83 year old patient who is known to have diabetes, now controlled with diet, significant left lower extremity peripheral vascular disease, which is not correctable, and general debility and is bed ridden. She has stage III pressure ulcers of the sacral and right gluteal area, but most significant is the necrosis of muscle on her left calf and left medial malleolus. In the setting of significant uncorrectable peripheral vascular disease, the chances of debriding, treating appropriately and hoping to  achieve local wound healing is next to impossible. after review have recommended: Wittwer, Treana ([id number]) 1. Silver alginate and bordered foam to the sacral and gluteal area and constant offloading has been discussed with them in great detail 2. continue oral antibiotics as per the PCP 3. Santyl ointment locally to the left lower extremity wounds covered with a bordered foam 4. Adequate protein, vitamin A, vitamin C and zinc 5. I took the patient's caregiver and daughter aside, and have had a detailed discussion with her regarding the critical limb ischemia and the high chances of ending up with cellulitis, sepsis and gangrene of the left below-knee extremity. I have asked the family to gather together and discuss the extent they would like to persue treatment options, for this otherwise morbid 83 year old patient. If they do want aggressive treatment, the patient may need to go back to the hospital for a amputation, possibly above- the-knee, so as to contain the imminent gangrene of the left below-knee leg. I will try to speak to the patient's PCP about the patient's general situation. Plan Wound Cleansing: Wound #3 Left,Medial Malleolus: Clean wound with Normal Saline. Cleanse wound with mild soap and water May Shower, gently pat wound dry prior to applying new dressing. Wound #4 Left,Anterior Lower Leg: Clean wound with Normal Saline. Cleanse wound with mild soap and water May Shower, gently pat wound dry prior to applying new dressing. Wound #5 Medial Sacrum: Clean wound with Normal Saline. Cleanse wound with mild soap and water May Shower, gently pat wound dry prior to applying new dressing. Wound #6 Right Sacrum: Clean wound with Normal Saline. Cleanse wound with mild soap and water May Shower, gently pat wound dry prior to applying new dressing. Wound #7 Right Ischial Tuberosity: Clean wound with Normal Saline. Cleanse wound with mild soap and water May Shower, gently pat  wound dry prior to applying new dressing. Anesthetic: Wound #3 Left,Medial Malleolus: Topical Lidocaine 4% cream applied to wound bed prior to debridement - for clinic use Wound #4 Left,Anterior Lower Leg: Topical Lidocaine 4% cream applied to wound bed prior to debridement - for clinic use Wound #5 Medial Sacrum: Topical Lidocaine 4% cream applied to wound bed prior to debridement - for clinic use Wound #6 Right Sacrum: Topical Lidocaine 4% cream applied to wound bed prior to debridement - for clinic use  Wound #7 Right Ischial Tuberosity: Topical Lidocaine 4% cream applied to wound bed prior to debridement - for clinic use Skin Barriers/Peri-Wound Care: ODEAL, WELDEN ([id number]) Wound #5 Medial Sacrum: Skin Prep Wound #6 Right Sacrum: Skin Prep Wound #7 Right Ischial Tuberosity: Skin Prep Primary Wound Dressing: Wound #3 Left,Medial Malleolus: Santyl Ointment Wound #4 Left,Anterior Lower Leg: Santyl Ointment Wound #5 Medial Sacrum: [place] Non-Adherent Wound #6 Right Sacrum: [place] Non-Adherent Wound #7 Right Ischial Tuberosity: [place] Non-Adherent Secondary Dressing: Wound #3 Left,Medial Malleolus: ABD pad Dry Gauze Conform/Kerlix - do not wrap tight Other - stretch netting #5 Wound #4 Left,Anterior Lower Leg: ABD pad Dry Gauze Conform/Kerlix - do not wrap tight Other - stretch netting #5 Wound #5 Medial Sacrum: Boardered Foam Dressing Wound #6 Right Sacrum: Boardered Foam Dressing Wound #7 Right Ischial Tuberosity: Boardered Foam Dressing Dressing Change Frequency: Wound #3 Left,Medial Malleolus: Change dressing every day. Wound #4 Left,Anterior Lower Leg: Change dressing every day. Wound #5 Medial Sacrum: Change dressing every other day. Wound #6 Right Sacrum: Change dressing every other day. Wound #7 Right Ischial Tuberosity: Change dressing every other day. Follow-up Appointments: Wound #3 Left,Medial Malleolus: Return Appointment in 1  week. Wound #4 Left,Anterior Lower Leg: Return Appointment in 1 week. Wound #5 Medial SacrumADELYNA, BROCKMAN ([id number]) Return Appointment in 1 week. Wound #6 Right Sacrum: Return Appointment in 1 week. Wound #7 Right Ischial Tuberosity: Return Appointment in 1 week. Edema Control: Wound #3 Left,Medial Malleolus: Elevate legs to the level of the heart and pump ankles as often as possible Wound #4 Left,Anterior Lower Leg: Elevate legs to the level of the heart and pump ankles as often as possible Off-Loading: Wound #3 Left,Medial Malleolus: Turn and reposition every 2 hours Other: - float heels while lying in bed or sitting Wound #4 Left,Anterior Lower Leg: Turn and reposition every 2 hours Other: - float heels while lying in bed or sitting Wound #5 Medial Sacrum: Turn and reposition every 2 hours Other: - float heels while lying in bed or sitting Wound #6 Right Sacrum: Turn and reposition every 2 hours Other: - float heels while lying in bed or sitting Wound #7 Right Ischial Tuberosity: Turn and reposition every 2 hours Other: - float heels while lying in bed or sitting Additional Orders / Instructions: Wound #3 Left,Medial Malleolus: Increase protein intake. Wound #4 Left,Anterior Lower Leg: Increase protein intake. Wound #5 Medial Sacrum: Increase protein intake. Wound #6 Right Sacrum: Increase protein intake. Wound #7 Right Ischial Tuberosity: Increase protein intake. Home Health: Wound #3 Left,Medial Malleolus: [place] Nurse may visit PRN to address patient s wound care needs. FACE TO FACE ENCOUNTER: MEDICARE and MEDICAID PATIENTS: I certify that this patient is under my care and that I had a face-to-face encounter that meets the physician face-to-face encounter requirements with this patient on this date. The encounter with the patient was in whole or in part for the following MEDICAL CONDITION: (primary reason for [place])  MEDICAL NECESSITY: I certify, that based on my findings, NURSING services are a medically necessary home health service. HOME BOUND STATUS: I certify that my clinical findings support that this patient is homebound (i.e., Due to illness or injury, pt requires aid of supportive devices such as crutches, cane, wheelchairs, walkers, the use of special transportation or the assistance of another person to leave their place of residence. There is a normal inability to leave the home and doing so requires considerable and taxing effort. Other absences are for  medical reasons / religious services and are infrequent or of short duration when for other reasons). KAMELA, BLANSETT ([id number]) If current dressing causes regression in wound condition, may D/C ordered dressing product/s and apply Normal Saline Moist Dressing daily until next [place] / Other MD appointment. [place] of regression in wound condition at [phone]. Please direct any NON-WOUND related issues/requests for orders to patient's Primary Care Physician Wound #4 Left,Anterior Lower Leg: [place] Nurse may visit PRN to address patient s wound care needs. FACE TO FACE ENCOUNTER: MEDICARE and MEDICAID PATIENTS: I certify that this patient is under my care and that I had a face-to-face encounter that meets the physician face-to-face encounter requirements with this patient on this date. The encounter with the patient was in whole or in part for the following MEDICAL CONDITION: (primary reason for [place]) MEDICAL NECESSITY: I certify, that based on my findings, NURSING services are a medically necessary home health service. HOME BOUND STATUS: I certify that my clinical findings support that this patient is homebound (i.e., Due to illness or injury, pt requires aid of supportive devices such as crutches, cane, wheelchairs, walkers, the use of special transportation or the  assistance of another person to leave their place of residence. There is a normal inability to leave the home and doing so requires considerable and taxing effort. Other absences are for medical reasons / religious services and are infrequent or of short duration when for other reasons). If current dressing causes regression in wound condition, may D/C ordered dressing product/s and apply Normal Saline Moist Dressing daily until next [place] / Other MD appointment. [place] of regression in wound condition at [phone]. Please direct any NON-WOUND related issues/requests for orders to patient's Primary Care Physician Wound #5 Medial Sacrum: [place] Nurse may visit PRN to address patient s wound care needs. FACE TO FACE ENCOUNTER: MEDICARE and MEDICAID PATIENTS: I certify that this patient is under my care and that I had a face-to-face encounter that meets the physician face-to-face encounter requirements with this patient on this date. The encounter with the patient was in whole or in part for the following MEDICAL CONDITION: (primary reason for [place]) MEDICAL NECESSITY: I certify, that based on my findings, NURSING services are a medically necessary home health service. HOME BOUND STATUS: I certify that my clinical findings support that this patient is homebound (i.e., Due to illness or injury, pt requires aid of supportive devices such as crutches, cane, wheelchairs, walkers, the use of special transportation or the assistance of another person to leave their place of residence. There is a normal inability to leave the home and doing so requires considerable and taxing effort. Other absences are for medical reasons / religious services and are infrequent or of short duration when for other reasons). If current dressing causes regression in wound condition, may D/C ordered dressing product/s and apply Normal Saline  Moist Dressing daily until next [place] / Other MD appointment. [place] of regression in wound condition at [phone]. Please direct any NON-WOUND related issues/requests for orders to patient's Primary Care Physician Wound #6 Right Sacrum: [place] Nurse may visit PRN to address patient s wound care needs. FACE TO FACE ENCOUNTER: MEDICARE and MEDICAID PATIENTS: I certify that this patient is under my care and that I had a face-to-face encounter that meets the physician face-to-face encounter  requirements with this patient on this date. The encounter with the patient was in whole or in part for the following MEDICAL CONDITION: (primary reason for [place]) MEDICAL NECESSITY: I certify, that based on my findings, NURSING services are a medically necessary home health service. HOME BOUND STATUS: I certify that my clinical findings support that this patient is homebound (i.e., Due to illness or injury, pt requires aid of supportive devices such as crutches, cane, wheelchairs, walkers, the use of special transportation or the assistance of another person to leave their place of residence. There is a normal inability to leave the home and doing so requires considerable and taxing effort. Other absences are for medical reasons / religious services and are infrequent or of short duration when for other reasons). CARMISHA, LARUSSO ([id number]) If current dressing causes regression in wound condition, may D/C ordered dressing product/s and apply Normal Saline Moist Dressing daily until next [place] / Other MD appointment. [place] of regression in wound condition at [phone]. Please direct any NON-WOUND related issues/requests for orders to patient's Primary Care Physician Wound #7 Right Ischial Tuberosity: [place] Nurse may visit PRN to address patient s wound care  needs. FACE TO FACE ENCOUNTER: MEDICARE and MEDICAID PATIENTS: I certify that this patient is under my care and that I had a face-to-face encounter that meets the physician face-to-face encounter requirements with this patient on this date. The encounter with the patient was in whole or in part for the following MEDICAL CONDITION: (primary reason for [place]) MEDICAL NECESSITY: I certify, that based on my findings, NURSING services are a medically necessary home health service. HOME BOUND STATUS: I certify that my clinical findings support that this patient is homebound (i.e., Due to illness or injury, pt requires aid of supportive devices such as crutches, cane, wheelchairs, walkers, the use of special transportation or the assistance of another person to leave their place of residence. There is a normal inability to leave the home and doing so requires considerable and taxing effort. Other absences are for medical reasons / religious services and are infrequent or of short duration when for other reasons). If current dressing causes regression in wound condition, may D/C ordered dressing product/s and apply Normal Saline Moist Dressing daily until next [place] / Other MD appointment. [place] of regression in wound condition at [phone]. Please direct any NON-WOUND related issues/requests for orders to patient's Primary Care Physician Medications-please add to medication list.: Wound #3 Left,Medial Malleolus: Other: - Vitamin C, Zinc, Vitamin A Wound #4 Left,Anterior Lower Leg: Other: - Vitamin C, Zinc, Vitamin A Wound #5 Medial Sacrum: Other: - Vitamin C, Zinc, Vitamin A Wound #6 Right Sacrum: Other: - Vitamin C, Zinc, Vitamin A Wound #7 Right Ischial Tuberosity: Other: - Vitamin C, Zinc, Vitamin A this 83 year old patient who is known to have diabetes, now controlled with diet, significant left lower extremity peripheral vascular disease,  which is not correctable, and general debility and is bed ridden. She has stage III pressure ulcers of the sacral and right gluteal area, but most significant is the necrosis of muscle on her left calf and left medial malleolus. In the setting of significant uncorrectable peripheral vascular disease, the chances of debriding, treating appropriately and hoping to achieve local wound healing is next to impossible. after review have recommended: 1. Silver alginate and bordered foam to the sacral and gluteal area and constant offloading has been discussed with  them in great detail Abruzzo, Rada ([id number]) 2. continue oral antibiotics as prescribed by her PCP 3. Santyl ointment locally to the left lower extremity wounds covered with a bordered foam 4. Adequate protein, vitamin A, vitamin C and zinc 5. I took the patient's caregiver and daughter aside, and have had a detailed discussion with her regarding the critical limb ischemia and the high chances of ending up with cellulitis, sepsis and gangrene of the left below-knee extremity. I have asked the family to gather together and discuss the extent they would like to persue treatment options, for this otherwise morbid 83 year old patient. If they do want aggressive treatment, the patient may need to go back to the hospital for a amputation, possibly above- the-knee, so as to contain the imminent gangrene of the left below-knee leg. I will try to speak to the patient's PCP about the patient's general situation. Notes Addendum -- Dr. Lacinda Axon has left the practice, but I managed to talk to his nurse practitioner [name], and I discussed the nature of the patient's wound and the fact that she may soon need either, an above- knee amputation or if the family decides not to do aggressive treatment she may end up with palliative care or hospice care. Electronic Signature(s) Signed: 03/24/2017 1:05:55 PM By: Christin Fudge MD, FACS Previous Signature:  03/24/2017 12:02:16 PM Version By: Christin Fudge MD, FACS Previous Signature: 03/24/2017 12:01:21 PM Version By: Christin Fudge MD, FACS Entered By: Christin Fudge on 03/24/2017 13:05:54 ANIQUA, BRIERE ([id number]) -------------------------------------------------------------------------------- ROS/PFSH Details Patient Name: ARWILDA, GEORGIA 03/24/2017 10:30 Date of Service: AM Medical Record [id number] Number: Patient Account Number: [id number] December 17, 1921 (83 y.o. Treating RN: [name] Date of Birth/Sex: Female) Other Clinician: Primary Care Provider: [name] Treating Atharv Barriere Referring Provider: [name] Provider/Extender: [name] in Treatment: 0 Information Obtained From Patient Wound History Do you currently have one or more open woundso Yes How many open wounds do you currently haveo 6 Approximately how long have you had your woundso 2 weeks How have you been treating your wound(s) until nowo santyl Has your wound(s) ever healed and then re-openedo No Have you had any lab work done in the past montho No Have you tested positive for an antibiotic resistant organism (MRSA, VRE)o No Have you tested positive for osteomyelitis (bone infection)o No Have you had any tests for circulation on your legso Yes Who ordered the testo dr dew [facility] Have you had other problems associated with your woundso Swelling Constitutional Symptoms (General Health) Complaints and Symptoms: No Complaints or Symptoms Eyes Medical History: Positive for: Glaucoma Negative for: Cataracts; Optic Neuritis Ear/Nose/Mouth/Throat Medical History: Negative for: Chronic sinus problems/congestion; Middle ear problems Hematologic/Lymphatic Medical History: Negative for: Anemia; Hemophilia; Human Immunodeficiency Virus; Lymphedema; Sickle Cell Disease Respiratory AINSLEY, DEAKINS ([id number]) Medical History: Negative for: Aspiration; Asthma; Chronic Obstructive Pulmonary  Disease (COPD); Pneumothorax; Sleep Apnea; Tuberculosis Cardiovascular Medical History: Positive for: Hypertension Negative for: Angina; Arrhythmia; Congestive Heart Failure; Coronary Artery Disease; Deep Vein Thrombosis; Hypotension; Myocardial Infarction; Peripheral Arterial Disease; Peripheral Venous Disease; Phlebitis; Vasculitis Past Medical History Notes: hyperlipidemia Gastrointestinal Medical History: Negative for: Cirrhosis ; Colitis; Crohnos; Hepatitis A; Hepatitis B; Hepatitis C Endocrine Medical History: Positive for: Type II Diabetes Negative for: Type I Diabetes Time with diabetes: many years Treated with: Insulin Blood sugar tested every day: Yes Tested : BID Blood sugar testing results: Breakfast: 120; Bedtime: 200 Genitourinary Medical History: Positive for: End Stage Renal Disease Immunological Medical History: Negative for: Lupus Erythematosus;  Raynaudos; Scleroderma Integumentary (Skin) Medical History: Negative for: History of Burn; History of pressure wounds Musculoskeletal Medical History: Positive for: Osteoarthritis - legs Negative for: Gout; Rheumatoid Arthritis; Osteomyelitis Mcmanaway, Raja ([id number]) Neurologic Medical History: Negative for: Dementia; Neuropathy; Quadriplegia; Paraplegia; Seizure Disorder Oncologic Medical History: Negative for: Received Chemotherapy; Received Radiation Past Medical History Notes: hx breast cancer s/p right mastectomy over 10 years ago Psychiatric Medical History: Negative for: Anorexia/bulimia; Confinement Anxiety HBO Extended History Items Eyes: Glaucoma Immunizations Pneumococcal Vaccine: Received Pneumococcal Vaccination: Yes Implantable Devices Family and Social History Cancer: No; Diabetes: Yes - Child; Heart Disease: Yes - Mother; Hereditary Spherocytosis: No; Hypertension: Yes - Mother; Kidney Disease: No; Lung Disease: No; Seizures: No; Stroke: No; Thyroid Problems: No; Tuberculosis: No;  Never smoker; Marital Status - Widowed; Alcohol Use: Never; Drug Use: No History; Caffeine Use: Never; Financial Concerns: No; Food, Clothing or Shelter Needs: No; Support System Lacking: No; Transportation Concerns: No; Advanced Directives: Yes (Not Provided); Patient does not want information on Advanced Directives; Living Will: No; Medical Power of Attorney: Yes (Copy provided) Physician Affirmation I have reviewed and agree with the above information. Electronic Signature(s) Signed: 03/25/2017 8:31:58 AM By: Christin Fudge MD, FACS Signed: 03/25/2017 9:47:23 AM By: Alric Quan Entered By: Christin Fudge on 03/24/2017 10:59:00 SEVILLA, MURTAGH ([id number]) -------------------------------------------------------------------------------- SuperBill Details Patient Name: [name] Date of Service: 03/24/2017 Medical Record Number: [id number] Patient Account Number: [id number] Date of Birth/Sex: 1921-10-09 (83 y.o. Female) Treating RN: Carolyne Fiscal, Debi Primary Care Provider: [name] Other Clinician: Referring Provider: [name] Treating Provider/Extender: Frann Rider in Treatment: 0 Diagnosis Coding ICD-10 Codes Code Description E11.622 Type 2 diabetes mellitus with other skin ulcer I73.9 Peripheral vascular disease, unspecified L97.223 Non-pressure chronic ulcer of left calf with necrosis of muscle L97.323 Non-pressure chronic ulcer of left ankle with necrosis of muscle L89.153 Pressure ulcer of sacral region, stage 3 L89.313 Pressure ulcer of right buttock, stage 3 I70.242 Atherosclerosis of native arteries of left leg with ulceration of calf Facility Procedures CPT4 Code: 28786767 Description: 20947 - WOUND CARE VISIT-LEV 5 EST PT Modifier: Quantity: 1 Physician Procedures CPT4 Code Description: [id number] 62947 - WC PHYS LEVEL 4 - EST PT ICD-10 Description Diagnosis E11.622 Type 2 diabetes mellitus with other skin ulcer I73.9 Peripheral vascular disease,  unspecified L97.223 Non-pressure chronic ulcer of left calf with  necrosis of L97.323 Non-pressure chronic ulcer of left ankle with necrosis o Modifier: muscle f muscle Quantity: 1 Electronic Signature(s) Signed: 03/25/2017 8:31:58 AM By: Christin Fudge MD, FACS Signed: 03/25/2017 9:47:23 AM By: Alric Quan Previous Signature: 03/24/2017 1:06:03 PM Version By: Christin Fudge MD, FACS Previous Signature: 03/24/2017 12:23:13 PM Version By: Christin Fudge MD, FACS Entered By: Alric Quan on 03/24/2017 14:34:26

## 2017-03-26 NOTE — Progress Notes (Signed)
ADA, HOLNESS ([id number]) Visit Report for 03/24/2017 Abuse/Suicide Risk Screen Details Patient Name: [name], [name] 03/24/2017 10:30 Date of Service: AM Medical Record [id number] Number: Patient Account Number: [id number] April 04, 1922 (83 y.o. Treating RN: [name] Date of Birth/Sex: Female) Other Clinician: Primary Care Kerrigan Glendening: [name] Treating Britto, Errol Referring Husain Costabile: [name] Shaketha Jeon/Extender: Weeks in Treatment: 0 Abuse/Suicide Risk Screen Items Answer ABUSE/SUICIDE RISK SCREEN: Has anyone close to you tried to hurt or harm you recentlyo No Do you feel uncomfortable with anyone in your familyo No Has anyone forced you do things that you didnot want to doo No Do you have any thoughts of harming yourselfo No Patient displays signs or symptoms of abuse and/or neglect. No Electronic Signature(s) Signed: 03/25/2017 9:47:23 AM By: Alric Quan Entered By: Alric Quan on 03/24/2017 10:37:05 Malecki, [name] ([id number]) -------------------------------------------------------------------------------- Activities of Daily Living Details Patient Name: [name] 03/24/2017 10:30 Date of Service: AM Medical Record [id number] Number: Patient Account Number: [id number] 10-Sep-1921 (83 y.o. Treating RN: [name] Date of Birth/Sex: Female) Other Clinician: Primary Care Ranyah Groeneveld: [name] Treating Britto, Errol Referring Rosemary Mossbarger: [name] Donya Hitch/Extender: [name] in Treatment: 0 Activities of Daily Living Items Answer Activities of Daily Living (Please select one for each item) Drive Automobile Not Able Take Medications Need Assistance Use Telephone Need Assistance Care for Appearance Not Able Use Toilet Not Able Bath / Shower Not Able Dress Self Not Able Feed Self Need Assistance Walk Not Able Get In / Out Bed Not Able Housework Not Able Prepare Meals Not Able Handle Money Not Able Shop for Self Not Able Electronic  Signature(s) Signed: 03/25/2017 9:47:23 AM By: Alric Quan Entered By: Alric Quan on 03/24/2017 10:37:54 Ruelas, [name] ([id number]) -------------------------------------------------------------------------------- Education Assessment Details Patient Name: [name] 03/24/2017 10:30 Date of Service: AM Medical Record [id number] Number: Patient Account Number: [id number] [birth year] (83 y.o. Treating RN: [name] Date of Birth/Sex: Female) Other Clinician: Primary Care Jilliann Subramanian: [name] Treating Britto, Errol Referring Tomisha Reppucci: [name] Cherryl Babin/Extender: [name] in Treatment: 0 Primary Learner Assessed: Caregiver daughter Learning Preferences/Education Level/Primary Language Learning Preference: Explanation, Printed Material Highest Education Level: College or Above Preferred Language: English Cognitive Barrier Assessment/Beliefs Language Barrier: No Translator Needed: No Memory Deficit: No Emotional Barrier: No Cultural/Religious Beliefs Affecting Medical No Care: Physical Barrier Assessment Impaired Vision: No Impaired Hearing: No Decreased Hand dexterity: No Knowledge/Comprehension Assessment Knowledge Level: Medium Comprehension Level: Medium Ability to understand written Medium instructions: Ability to understand verbal Medium instructions: Motivation Assessment Anxiety Level: Calm Cooperation: Cooperative Education Importance: Acknowledges Need Interest in Health Problems: Asks Questions Perception: Coherent Willingness to Engage in Self- Medium Management Activities: Readiness to Engage in Self- Medium Management Activities: NEIRA, BENTSEN ([id number]) Electronic Signature(s) Signed: 03/25/2017 9:47:23 AM By: Alric Quan Entered By: Alric Quan on 03/24/2017 10:38:46 Mayall, Harlei ([id number]) -------------------------------------------------------------------------------- Fall Risk Assessment Details Patient Name:  [name] 03/24/2017 10:30 Date of Service: AM Medical Record [id number] Number: Patient Account Number: [id number] 1922/02/19 (83 y.o. Treating RN: [name] Date of Birth/Sex: Female) Other Clinician: Primary Care Gussie Towson: [name] Treating Britto, Errol Referring Kaan Tosh: [name] Monick Rena/Extender: [name] in Treatment: 0 Fall Risk Assessment Items Have you had 2 or more falls in the last 12 monthso 0 No Have you had any fall that resulted in injury in the last 12 monthso 0 No FALL RISK ASSESSMENT: History of falling - immediate or within 3 months 0 No Secondary diagnosis 15 Yes Ambulatory aid None/bed rest/wheelchair/nurse 0 Yes Crutches/cane/walker 0 No Furniture 0 No IV Access/Saline Lock 0 No  Gait/Training Normal/bed rest/immobile 0 No Weak 10 Yes Impaired 20 Yes Mental Status Oriented to own ability 0 Yes Electronic Signature(s) Signed: 03/25/2017 9:47:23 AM By: Alric Quan Entered By: Alric Quan on 03/24/2017 10:39:09 Koehler, Cleone ([id number]) -------------------------------------------------------------------------------- Foot Assessment Details Patient Name: [name] 03/24/2017 10:30 Date of Service: AM Medical Record [id number] Number: Patient Account Number: [id number] Jan 27, 1922 (83 y.o. Treating RN: [name] Date of Birth/Sex: Female) Other Clinician: Primary Care Caroleena Paolini: [name] Treating Britto, Errol Referring Alyviah Crandle: [name] Belva Koziel/Extender: [name] in Treatment: 0 Foot Assessment Items Site Locations + = Sensation present, - = Sensation absent, C = Callus, U = Ulcer R = Redness, W = Warmth, M = Maceration, PU = Pre-ulcerative lesion F = Fissure, S = Swelling, D = Dryness Assessment Right: Left: Other Deformity: No No Prior Foot Ulcer: No No Prior Amputation: No No Charcot Joint: No No Ambulatory Status: Non-ambulatory Assistance Device: Wheelchair Gait: Electronic Signature(s) Signed:  03/25/2017 9:47:23 AM By: Alric Quan Entered By: Alric Quan on 03/24/2017 10:45:33 Laning, Avarey ([id number]) [place], [place] ([id number]) -------------------------------------------------------------------------------- Nutrition Risk Assessment Details Patient Name: KASSIDY, DOCKENDORF 03/24/2017 10:30 Date of Service: AM Medical Record [id number] Number: Patient Account Number: [id number] May 20, 1922 (83 y.o. Treating RN: [name] Date of Birth/Sex: Female) Other Clinician: Primary Care Zyler Hyson: [name] Treating Britto, Errol Referring Hajer Dwyer: [name] Vann Okerlund/Extender: Weeks in Treatment: 0 Height (in): 60 Weight (lbs): 157 Body Mass Index (BMI): 30.7 Nutrition Risk Assessment Items NUTRITION RISK SCREEN: I have an illness or condition that made me change the kind and/or 0 No amount of food I eat I eat fewer than two meals per day 0 No I eat few fruits and vegetables, or milk products 0 No I have three or more drinks of beer, liquor or wine almost every day 0 No I have tooth or mouth problems that make it hard for me to eat 0 No I don't always have enough money to buy the food I need 0 No I eat alone most of the time 0 No I take three or more different prescribed or over-the-counter drugs a 1 Yes day Without wanting to, I have lost or gained 10 pounds in the last six 0 No months I am not always physically able to shop, cook and/or feed myself 2 Yes Nutrition Protocols Good Risk Protocol 0 No interventions needed Moderate Risk Protocol Electronic Signature(s) Signed: 03/25/2017 9:47:23 AM By: Alric Quan Entered By: Alric Quan on 03/24/2017 10:39:30

## 2017-03-28 ENCOUNTER — Ambulatory Visit
Admission: RE | Admit: 2017-03-28 | Discharge: 2017-03-28 | Disposition: A | Discharge status: Home / Self Care | Payer: Medicare PPO | Source: Ambulatory Visit | Attending: Oncology | Admitting: Oncology

## 2017-03-28 DIAGNOSIS — C50512 Malignant neoplasm of lower-outer quadrant of left female breast: Secondary | ICD-10-CM | POA: Diagnosis not present

## 2017-03-29 NOTE — Telephone Encounter (Signed)
Mail letter. 

## 2017-03-29 NOTE — Telephone Encounter (Signed)
Letter has been mailed.

## 2017-04-04 ENCOUNTER — Encounter: Payer: Medicare PPO | Admitting: Surgery

## 2017-04-04 DIAGNOSIS — E11622 Type 2 diabetes mellitus with other skin ulcer: Secondary | ICD-10-CM | POA: Diagnosis not present

## 2017-04-05 NOTE — Progress Notes (Addendum)
Yang Yang ([id number]) Visit Report for 04/04/2017 Arrival Information Details Patient Name: Yang Yang, Yang Yang Date of Service: 04/04/2017 9:45 AM Medical Record Number: [id number] Patient Account Number: [id number] Date of Birth/Sex: Sep 26, 1921 (83 y.o. Female) Treating RN: Carolyne Fiscal, Debi Primary Care Julieta Rogalski: [name] Other Clinician: Referring Tanica Gaige: [name] Treating Nazair Fortenberry/Extender: Frann Rider in Treatment: 1 Visit Information History Since Last Visit All ordered tests and consults were completed: No Patient Arrived: Wheel Chair Added or deleted any medications: No Arrival Time: 09:48 Any new allergies or adverse reactions: No Accompanied By: daughter Had a fall or experienced change in No Transfer Assistance: EasyPivot Patient activities of daily living that may affect Lift risk of falls: Patient Identification Verified: Yes Signs or symptoms of abuse/neglect since last visito No Secondary Verification Process Yes Hospitalized since last visit: No Completed: Has Dressing in Place as Prescribed: Yes Patient Requires Transmission-Based No Precautions: Pain Present Now: No Patient Has Alerts: Yes Patient Alerts: DM II Electronic Signature(s) Signed: 04/04/2017 4:51:17 PM By: Alric Quan Entered By: Alric Quan on 04/04/2017 09:49:19 Yang Yang ([id number]) -------------------------------------------------------------------------------- Clinic Level of Care Assessment Details Patient Name: Yang Yang Date of Service: 04/04/2017 9:45 AM Medical Record Number: [id number] Patient Account Number: [id number] Date of Birth/Sex: August 29, 1921 (83 y.o. Female) Treating RN: Carolyne Fiscal, Debi Primary Care Kinnick Maus: [name] Other Clinician: Referring Loisann Roach: [name] Treating Kennidee Heyne/Extender: Frann Rider in Treatment: 1 Clinic Level of Care Assessment Items TOOL 4 Quantity Score X - Use when only an EandM is performed on FOLLOW-UP  visit 1 0 ASSESSMENTS - Nursing Assessment / Reassessment X - Reassessment of Co-morbidities (includes updates in patient status) 1 10 X- 1 5 Reassessment of Adherence to Treatment Plan ASSESSMENTS - Wound and Skin Assessment / Reassessment []  - Simple Wound Assessment / Reassessment - one wound 0 X- 6 5 Complex Wound Assessment / Reassessment - multiple wounds []  - 0 Dermatologic / Skin Assessment (not related to wound area) ASSESSMENTS - Focused Assessment []  - Circumferential Edema Measurements - multi extremities 0 []  - 0 Nutritional Assessment / Counseling / Intervention []  - 0 Lower Extremity Assessment (monofilament, tuning fork, pulses) []  - 0 Peripheral Arterial Disease Assessment (using hand held doppler) ASSESSMENTS - Ostomy and/or Continence Assessment and Care []  - Incontinence Assessment and Management 0 []  - 0 Ostomy Care Assessment and Management (repouching, etc.) PROCESS - Coordination of Care []  - Simple Patient / Family Education for ongoing care 0 X- 1 20 Complex (extensive) Patient / Family Education for ongoing care X- 1 10 Staff obtains Programmer, systems, Records, Test Results / Process Orders X- 1 10 Staff telephones HHA, Nursing Homes / Clarify orders / etc []  - 0 Routine Transfer to another Facility (non-emergent condition) []  - 0 Routine Hospital Admission (non-emergent condition) []  - 0 New Admissions / Biomedical engineer / Ordering NPWT, Apligraf, etc. []  - 0 Emergency Hospital Admission (emergent condition) X- 1 10 Simple Discharge Coordination Narvaez, Yang ([id number]) []  - 0 Complex (extensive) Discharge Coordination PROCESS - Special Needs []  - Pediatric / Minor Patient Management 0 []  - 0 Isolation Patient Management []  - 0 Hearing / Language / Visual special needs []  - 0 Assessment of Community assistance (transportation, D/C planning, etc.) []  - 0 Additional assistance / Altered mentation []  - 0 Support Surface(s) Assessment  (bed, cushion, seat, etc.) INTERVENTIONS - Wound Cleansing / Measurement []  - Simple Wound Cleansing - one wound 0 X- 6 5 Complex Wound Cleansing - multiple wounds X- 1 5 Wound Imaging (photographs - any number  of wounds) []  - 0 Wound Tracing (instead of photographs) []  - 0 Simple Wound Measurement - one wound X- 6 5 Complex Wound Measurement - multiple wounds INTERVENTIONS - Wound Dressings X - Small Wound Dressing one or multiple wounds 6 10 []  - 0 Medium Wound Dressing one or multiple wounds []  - 0 Large Wound Dressing one or multiple wounds X- 1 5 Application of Medications - topical []  - 0 Application of Medications - injection INTERVENTIONS - Miscellaneous []  - External ear exam 0 []  - 0 Specimen Collection (cultures, biopsies, blood, body fluids, etc.) []  - 0 Specimen(s) / Culture(s) sent or taken to Lab for analysis X- 1 10 Patient Transfer (multiple staff / Harrel Lemon Lift / Similar devices) []  - 0 Simple Staple / Suture removal (25 or less) []  - 0 Complex Staple / Suture removal (26 or more) []  - 0 Hypo / Hyperglycemic Management (close monitor of Blood Glucose) []  - 0 Ankle / Brachial Index (ABI) - do not check if billed separately X- 1 5 Vital Signs Yang Yang ([id number]) Has the patient been seen at the hospital within the last three years: Yes Total Score: 240 Level Of Care: New/Established - Level 5 Electronic Signature(s) Signed: 04/04/2017 4:51:17 PM By: Alric Quan Entered By: Alric Quan on 04/04/2017 14:32:29 Yang Yang ([id number]) -------------------------------------------------------------------------------- Encounter Discharge Information Details Patient Name: Yang Yang Date of Service: 04/04/2017 9:45 AM Medical Record Number: [id number] Patient Account Number: [id number] Date of Birth/Sex: 11-07-1921 (83 y.o. Female) Treating RN: Carolyne Fiscal, Debi Primary Care Adalia Pettis: [name] Other Clinician: Referring Malakie Balis: [name] Treating Ajai Terhaar/Extender: Frann Rider in Treatment: 1 Encounter Discharge Information Items Discharge Pain Level: 0 Discharge Condition: Stable Ambulatory Status: Wheelchair Discharge Destination: Home Transportation: Private Auto Accompanied By: daughter Schedule Follow-up Appointment: Yes Medication Reconciliation completed and No provided to Patient/Care Anabelle Bungert: Provided on Clinical Summary of Care: 04/04/2017 Form Type Recipient Paper Patient VD Electronic Signature(s) Signed: 04/04/2017 2:34:38 PM By: Alric Quan Entered By: Alric Quan on 04/04/2017 14:34:38 Morga, Yang ([id number]) -------------------------------------------------------------------------------- Lower Extremity Assessment Details Patient Name: Yang Yang Date of Service: 04/04/2017 9:45 AM Medical Record Number: [id number] Patient Account Number: [id number] Date of Birth/Sex: January 05, 1922 (83 y.o. Female) Treating RN: Montey Hora Primary Care Rebbecca Osuna: [name] Other Clinician: Referring Irianna Gilday: [name] Treating Mikiala Fugett/Extender: Frann Rider in Treatment: 1 Vascular Assessment Pulses: Dorsalis Pedis Palpable: [Left:No] Doppler Audible: [Left:Inaudible] Posterior Tibial Palpable: [Left:No] Doppler Audible: [Left:Yes] Extremity colors, hair growth, and conditions: Extremity Color: [Left:Hyperpigmented] Temperature of Extremity: [Left:Warm] Capillary Refill: [Left:< 3 seconds] Toe Nail Assessment Left: Right: Thick: Yes Discolored: Yes Deformed: Yes Improper Length and Hygiene: No Electronic Signature(s) Signed: 04/04/2017 4:16:02 PM By: Montey Hora Signed: 04/04/2017 4:51:17 PM By: Alric Quan Entered By: Alric Quan on 04/04/2017 10:21:00 Kotara, Yang Yang ([id number]) -------------------------------------------------------------------------------- Multi Wound Chart Details Patient Name: Yang Yang Date of Service: 04/04/2017 9:45  AM Medical Record Number: [id number] Patient Account Number: [id number] Date of Birth/Sex: 01-08-1922 (83 y.o. Female) Treating RN: Carolyne Fiscal, Debi Primary Care Aum Caggiano: [name] Other Clinician: Referring Drue Harr: [name] Treating Vollie Brunty/Extender: Frann Rider in Treatment: 1 Vital Signs Height(in): 60 Pulse(bpm): 96 Weight(lbs): 157 Blood Pressure(mmHg): 134/52 Body Mass Index(BMI): 31 Temperature(F): 97.8 Respiratory Rate 16 (breaths/min): Photos: [3:No Photos] [4:No Photos] [5:No Photos] Wound Location: [3:Left, Medial Malleolus] [4:Left, Anterior Lower Leg] [5:Medial Sacrum] Wounding Event: [3:Gradually Appeared] [4:Gradually Appeared] [5:Pressure Injury] Primary Etiology: [3:Diabetic Wound/Ulcer of the Lower Extremity] [4:Diabetic Wound/Ulcer of the Lower Extremity] [5:Pressure Ulcer] Comorbid History: [3:N/A] [4:N/A] [5:N/A] Date  Acquired: [3:03/10/2017] [4:03/09/2017] [5:03/09/2017] Weeks of Treatment: [3:1] [4:1] [5:1] Wound Status: [3:Open] [4:Open] [5:Open] Measurements L x W x D [3:2x2.5x0.6] [4:8x0.5x0.9] [5:2.5x5x0.1] (cm) Area (cm) : [3:3.927] [4:3.142] [5:9.817] Volume (cm) : [3:2.356] [4:2.827] [5:0.982] % Reduction in Area: [3:-20.80%] [4:79.20%] [5:-171.70%] % Reduction in Volume: [3:-81.10%] [4:79.20%] [5:-172.00%] Classification: [3:Grade 1] [4:Grade 2] [5:Category/Stage III] Exudate Amount: [3:N/A] [4:N/A] [5:N/A] Exudate Type: [3:N/A] [4:N/A] [5:N/A] Exudate Color: [3:N/A] [4:N/A] [5:N/A] Wound Margin: [3:N/A] [4:N/A] [5:N/A] Granulation Amount: [3:N/A] [4:N/A] [5:N/A] Granulation Quality: [3:N/A] [4:N/A] [5:N/A] Necrotic Amount: [3:N/A] [4:N/A] [5:N/A] Necrotic Tissue: [3:N/A] [4:N/A] [5:N/A] Epithelialization: [3:N/A] [4:N/A] [5:N/A] Periwound Skin Texture: [3:No Abnormalities Noted] [4:No Abnormalities Noted] [5:No Abnormalities Noted] Periwound Skin Moisture: [3:No Abnormalities Noted] [4:No Abnormalities Noted] [5:No  Abnormalities Noted] Periwound Skin Color: [3:No Abnormalities Noted] [4:No Abnormalities Noted] [5:No Abnormalities Noted] Temperature: [3:N/A No] [4:N/A No] [5:N/A No] Wound Number: 6 7 8  Photos: No Photos No Photos No Photos Wound Location: Right Sacrum Right Ischial Tuberosity Left Sacrum - Medial Wounding Event: Pressure Injury Pressure Injury Pressure Injury Primary Etiology: Pressure Ulcer Pressure Ulcer Pressure Ulcer Sibal, Yang ([id number]) Comorbid History: N/A N/A Glaucoma, Hypertension, Type II Diabetes, End Stage Renal Disease, Osteoarthritis Date Acquired: 03/09/2017 03/09/2017 03/31/2017 Weeks of Treatment: 1 1 0 Wound Status: Open Open Open Measurements L x W x D 0.3x2.5x0.1 0.8x1x0.1 0.6x0.7x0.1 (cm) Area (cm) : 0.589 0.628 0.33 Volume (cm) : 0.059 0.063 0.033 % Reduction in Area: 46.50% 0.00% N/A % Reduction in Volume: 46.40% 0.00% N/A Classification: Category/Stage III Category/Stage III Category/Stage II Exudate Amount: N/A N/A Small Exudate Type: N/A N/A Sanguinous Exudate Color: N/A N/A red Wound Margin: N/A N/A Flat and Intact Granulation Amount: N/A N/A Medium (34-66%) Granulation Quality: N/A N/A Red Necrotic Amount: N/A N/A Small (1-33%) Necrotic Tissue: N/A N/A Eschar Epithelialization: N/A N/A None Periwound Skin Texture: No Abnormalities Noted No Abnormalities Noted Excoriation: No Induration: No Callus: No Crepitus: No Rash: No Scarring: No Periwound Skin Moisture: No Abnormalities Noted No Abnormalities Noted Maceration: No Dry/Scaly: No Periwound Skin Color: No Abnormalities Noted No Abnormalities Noted Ecchymosis: Yes Atrophie Blanche: No Cyanosis: No Erythema: No Hemosiderin Staining: No Mottled: No Pallor: No Rubor: No Temperature: N/A N/A No Abnormality Tenderness on Palpation: No No No Treatment Notes Electronic Signature(s) Signed: 04/04/2017 10:27:00 AM By: Christin Fudge MD, FACS Entered By: Christin Fudge on 04/04/2017  10:27:00 Yang Yang ([id number]) -------------------------------------------------------------------------------- [place] Details Patient Name: Yang Yang Date of Service: 04/04/2017 9:45 AM Medical Record Number: [id number] Patient Account Number: [id number] Date of Birth/Sex: [birth year] (83 y.o. Female) Treating RN: Carolyne Fiscal, Debi Primary Care Kreed Kauffman: [name] Other Clinician: Referring Brianda Beitler: [name] Treating Sheela Mcculley/Extender: Frann Rider in Treatment: 1 Active Inactive Electronic Signature(s) Signed: 05/11/2017 9:12:16 AM By: Alric Quan Previous Signature: 05/03/2017 8:53:58 AM Version By: Gretta Cool BSN, RN, CWS, Kim RN, BSN Previous Signature: 04/04/2017 4:51:17 PM Version By: Alric Quan Entered By: Alric Quan on 05/11/2017 09:12:16 [place], Yang ([id number]) -------------------------------------------------------------------------------- Pain Assessment Details Patient Name: Yang Yang Date of Service: 04/04/2017 9:45 AM Medical Record Number: [id number] Patient Account Number: [id number] Date of Birth/Sex: 1922-05-31 (83 y.o. Female) Treating RN: Carolyne Fiscal, Debi Primary Care Kadee Philyaw: [name] Other Clinician: Referring Everardo Voris: [name] Treating Casimir Barcellos/Extender: Frann Rider in Treatment: 1 Active Problems Location of Pain Severity and Description of Pain Patient Has Paino No Site Locations Pain Management and Medication Current Pain Management: Electronic Signature(s) Signed: 04/04/2017 4:51:17 PM By: Alric Quan Entered By: Alric Quan on 04/04/2017 09:49:24 Yang Yang, Yang Yang ([id number]) -------------------------------------------------------------------------------- Patient/Caregiver Education Details  Patient Name: TAELER, WINNING Date of Service: 04/04/2017 9:45 AM Medical Record Number: [id number] Patient Account Number: [id number] Date of Birth/Gender: 1921-10-09 (83 y.o.  Female) Treating RN: Carolyne Fiscal, Debi Primary Care Physician: [name] Other Clinician: Referring Physician: [name] Treating Physician/Extender: Frann Rider in Treatment: 1 Education Assessment Education Provided To: Patient and Caregiver daughter Education Topics Provided Wound/Skin Impairment: Handouts: Other: change dressing as ordered Methods: Demonstration, Explain/Verbal Responses: State content correctly Electronic Signature(s) Signed: 04/04/2017 4:51:17 PM By: Alric Quan Entered By: Alric Quan on 04/04/2017 14:34:44 Cremeans, Yang ([id number]) -------------------------------------------------------------------------------- Wound Assessment Details Patient Name: Yang Yang Date of Service: 04/04/2017 9:45 AM Medical Record Number: [id number] Patient Account Number: [id number] Date of Birth/Sex: 05/28/1922 (83 y.o. Female) Treating RN: Cornell Barman Primary Care Adger Cantera: [name] Other Clinician: Referring Heber Hoog: [name] Treating Sheilyn Boehlke/Extender: Frann Rider in Treatment: 1 Wound Status Wound Number: 3 Primary Diabetic Wound/Ulcer of the Lower Etiology: Extremity Wound Location: Left, Medial Malleolus Wound Status: Open Wounding Event: Gradually Appeared Date Acquired: 03/10/2017 Weeks Of Treatment: 1 Clustered Wound: No Photos Photo Uploaded By: Gretta Cool, BSN, RN, CWS, Kim on 04/04/2017 11:21:10 Wound Measurements Length: (cm) 2 Width: (cm) 2.5 Depth: (cm) 0.6 Area: (cm) 3.927 Volume: (cm) 2.356 % Reduction in Area: -20.8% % Reduction in Volume: -81.1% Wound Description Classification: Grade 1 Periwound Skin Texture Texture Color No Abnormalities Noted: No No Abnormalities Noted: No Moisture No Abnormalities Noted: No Electronic Signature(s) Signed: 04/04/2017 4:56:47 PM By: Gretta Cool, BSN, RN, CWS, Kim RN, BSN Entered By: Gretta Cool, BSN, RN, CWS, Kim on 04/04/2017 10:01:45 Yang Yang  ([id number]) -------------------------------------------------------------------------------- Wound Assessment Details Patient Name: Yang Yang Date of Service: 04/04/2017 9:45 AM Medical Record Number: [id number] Patient Account Number: [id number] Date of Birth/Sex: 1921-07-27 (83 y.o. Female) Treating RN: Cornell Barman Primary Care Rayvon Brandvold: [name] Other Clinician: Referring Tiffane Sheldon: [name] Treating Lynsee Wands/Extender: Frann Rider in Treatment: 1 Wound Status Wound Number: 4 Primary Diabetic Wound/Ulcer of the Lower Etiology: Extremity Wound Location: Left, Anterior Lower Leg Wound Status: Open Wounding Event: Gradually Appeared Date Acquired: 03/09/2017 Weeks Of Treatment: 1 Clustered Wound: No Photos Photo Uploaded By: Gretta Cool, BSN, RN, CWS, Kim on 04/04/2017 11:21:10 Wound Measurements Length: (cm) 8 Width: (cm) 0.5 Depth: (cm) 0.9 Area: (cm) 3.142 Volume: (cm) 2.827 % Reduction in Area: 79.2% % Reduction in Volume: 79.2% Wound Description Classification: Grade 2 Periwound Skin Texture Texture Color No Abnormalities Noted: No No Abnormalities Noted: No Moisture No Abnormalities Noted: No Electronic Signature(s) Signed: 04/04/2017 4:56:47 PM By: Gretta Cool, BSN, RN, CWS, Kim RN, BSN Entered By: Gretta Cool, BSN, RN, CWS, Kim on 04/04/2017 10:01:45 Yang Yang ([id number]) -------------------------------------------------------------------------------- Wound Assessment Details Patient Name: Yang Yang Date of Service: 04/04/2017 9:45 AM Medical Record Number: [id number] Patient Account Number: [id number] Date of Birth/Sex: 18-Sep-1921 (83 y.o. Female) Treating RN: Cornell Barman Primary Care Joyous Gleghorn: [name] Other Clinician: Referring Damarie Schoolfield: [name] Treating Zachry Hopfensperger/Extender: Frann Rider in Treatment: 1 Wound Status Wound Number: 5 Primary Etiology: Pressure Ulcer Wound Location: Medial Sacrum Wound Status: Open Wounding Event:  Pressure Injury Date Acquired: 03/09/2017 Weeks Of Treatment: 1 Clustered Wound: No Photos Photo Uploaded By: Gretta Cool, BSN, RN, CWS, Kim on 04/04/2017 11:22:51 Wound Measurements Length: (cm) 2.5 Width: (cm) 5 Depth: (cm) 0.1 Area: (cm) 9.817 Volume: (cm) 0.982 % Reduction in Area: -171.7% % Reduction in Volume: -172% Wound Description Classification: Category/Stage III Periwound Skin Texture Texture Color No Abnormalities Noted: No No Abnormalities Noted: No Moisture No Abnormalities Noted: No Electronic Signature(s) Signed: 04/04/2017 4:56:47 PM  By: Gretta Cool, BSN, RN, CWS, Kim RN, BSN Entered By: Gretta Cool, BSN, RN, CWS, Kim on 04/04/2017 10:01:45 Yang Yang ([id number]) -------------------------------------------------------------------------------- Wound Assessment Details Patient Name: Yang Yang Date of Service: 04/04/2017 9:45 AM Medical Record Number: [id number] Patient Account Number: [id number] Date of Birth/Sex: 24-Oct-1921 (83 y.o. Female) Treating RN: Cornell Barman Primary Care Danijela Vessey: [name] Other Clinician: Referring Hriday Stai: [name] Treating Quentavious Rittenhouse/Extender: Frann Rider in Treatment: 1 Wound Status Wound Number: 6 Primary Etiology: Pressure Ulcer Wound Location: Right Sacrum Wound Status: Open Wounding Event: Pressure Injury Date Acquired: 03/09/2017 Weeks Of Treatment: 1 Clustered Wound: No Photos Photo Uploaded By: Gretta Cool, BSN, RN, CWS, Kim on 04/04/2017 11:23:52 Wound Measurements Length: (cm) 0.3 Width: (cm) 2.5 Depth: (cm) 0.1 Area: (cm) 0.589 Volume: (cm) 0.059 % Reduction in Area: 46.5% % Reduction in Volume: 46.4% Wound Description Classification: Category/Stage III Periwound Skin Texture Texture Color No Abnormalities Noted: No No Abnormalities Noted: No Moisture No Abnormalities Noted: No Electronic Signature(s) Signed: 04/04/2017 4:56:47 PM By: Gretta Cool, BSN, RN, CWS, Kim RN, BSN Entered By: Gretta Cool, BSN, RN, CWS,  Kim on 04/04/2017 10:03:26 Yang Yang ([id number]) -------------------------------------------------------------------------------- Wound Assessment Details Patient Name: Yang Yang Date of Service: 04/04/2017 9:45 AM Medical Record Number: [id number] Patient Account Number: [id number] Date of Birth/Sex: Jul 19, 1921 (83 y.o. Female) Treating RN: Cornell Barman Primary Care Benigna Delisi: [name] Other Clinician: Referring Jobeth Pangilinan: [name] Treating Nery Frappier/Extender: Frann Rider in Treatment: 1 Wound Status Wound Number: 7 Primary Etiology: Pressure Ulcer Wound Location: Right Ischial Tuberosity Wound Status: Open Wounding Event: Pressure Injury Date Acquired: 03/09/2017 Weeks Of Treatment: 1 Clustered Wound: No Photos Photo Uploaded By: Gretta Cool, BSN, RN, CWS, Kim on 04/04/2017 11:23:52 Wound Measurements Length: (cm) 0.8 Width: (cm) 1 Depth: (cm) 0.1 Area: (cm) 0.628 Volume: (cm) 0.063 % Reduction in Area: 0% % Reduction in Volume: 0% Wound Description Classification: Category/Stage III Periwound Skin Texture Texture Color No Abnormalities Noted: No No Abnormalities Noted: No Moisture No Abnormalities Noted: No Electronic Signature(s) Signed: 04/04/2017 4:56:47 PM By: Gretta Cool, BSN, RN, CWS, Kim RN, BSN Entered By: Gretta Cool, BSN, RN, CWS, Kim on 04/04/2017 10:03:27 Yang Yang ([id number]) -------------------------------------------------------------------------------- Wound Assessment Details Patient Name: Yang Yang Date of Service: 04/04/2017 9:45 AM Medical Record Number: [id number] Patient Account Number: [id number] Date of Birth/Sex: August 28, 1921 (83 y.o. Female) Treating RN: Cornell Barman Primary Care Remona Boom: [name] Other Clinician: Referring Yamila Cragin: [name] Treating Mitcheal Sweetin/Extender: Frann Rider in Treatment: 1 Wound Status Wound Number: 8 Primary Pressure Ulcer Etiology: Wound Location: Left Sacrum - Medial Wound Open Wounding  Event: Pressure Injury Status: Date Acquired: 03/31/2017 Comorbid Glaucoma, Hypertension, Type II Diabetes, Weeks Of Treatment: 0 History: End Stage Renal Disease, Osteoarthritis Clustered Wound: No Photos Photo Uploaded By: Gretta Cool, BSN, RN, CWS, Kim on 04/04/2017 11:24:52 Wound Measurements Length: (cm) 0.6 Width: (cm) 0.7 Depth: (cm) 0.1 Area: (cm) 0.33 Volume: (cm) 0.033 % Reduction in Area: % Reduction in Volume: Epithelialization: None Tunneling: No Undermining: No Wound Description Classification: Category/Stage II Wound Margin: Flat and Intact Exudate Amount: Small Exudate Type: Sanguinous Exudate Color: red Foul Odor After Cleansing: No Slough/Fibrino Yes Wound Bed Granulation Amount: Medium (34-66%) Granulation Quality: Red Necrotic Amount: Small (1-33%) Necrotic Quality: Eschar Periwound Skin Texture Texture Color No Abnormalities Noted: No No Abnormalities Noted: No Callus: No Atrophie Blanche: No Crepitus: No Cyanosis: No Excoriation: No Ecchymosis: Yes Induration: No Erythema: No Howze, Yang ([id number]) Rash: No Hemosiderin Staining: No Scarring: No Mottled: No Pallor: No Moisture Rubor: No No Abnormalities Noted: No Dry /  Scaly: No Temperature / Pain Maceration: No Temperature: No Abnormality Electronic Signature(s) Signed: 04/04/2017 4:56:47 PM By: Gretta Cool, BSN, RN, CWS, Kim RN, BSN Entered By: Gretta Cool, BSN, RN, CWS, Kim on 04/04/2017 10:05:36 Yang Yang ([id number]) -------------------------------------------------------------------------------- Vitals Details Patient Name: Yang Yang Date of Service: 04/04/2017 9:45 AM Medical Record Number: [id number] Patient Account Number: [id number] Date of Birth/Sex: 1921/10/19 (83 y.o. Female) Treating RN: Carolyne Fiscal, Debi Primary Care Juliett Eastburn: [name] Other Clinician: Referring Exavior Kimmons: [name] Treating Ashana Tullo/Extender: Frann Rider in Treatment: 1 Vital Signs Time  Taken: 09:49 Temperature (F): 97.8 Height (in): 60 Pulse (bpm): 96 Weight (lbs): 157 Respiratory Rate (breaths/min): 16 Body Mass Index (BMI): 30.7 Blood Pressure (mmHg): 134/52 Reference Range: 80 - 120 mg / dl Electronic Signature(s) Signed: 04/04/2017 4:51:17 PM By: Alric Quan Entered By: Alric Quan on 04/04/2017 09:50:20

## 2017-04-05 NOTE — Progress Notes (Signed)
[name] ([id number]) Visit Report for 04/04/2017 Chief Complaint Document Details Patient Name: [name], [name] Date of Service: 04/04/2017 9:45 AM Medical Record Number: [id number] Patient Account Number: [id number] Date of Birth/Sex: 12-15-1921 (83 y.o. Female) Treating RN: [name] Primary Care Provider: Thersa Yang Other Clinician: Referring Provider: Thersa Yang Treating Provider/Extender: [name] in Treatment: 1 Information Obtained from: Patient Chief Complaint Patient returns to the wound care center for new ulcer to: the sacral region, right buttock, left medial ankle left medial calf, which she's had for about 2 weeks now Electronic Signature(s) Signed: 04/04/2017 10:27:08 AM By: [name] Fudge MD, FACS Entered By: [name] on 04/04/2017 10:27:08 [name] ([id number]) -------------------------------------------------------------------------------- HPI Details Patient Name: [name] Date of Service: 04/04/2017 9:45 AM Medical Record Number: [id number] Patient Account Number: [id number] Date of Birth/Sex: 10/02/1921 (83 y.o. Female) Treating RN: [name] Primary Care Provider: Thersa Yang Other Clinician: Referring Provider: Thersa Yang Treating Provider/Extender: [name] in Treatment: 1 History of Present Illness Location: sacral region,right buttock, right ischial tuberosity,left medial malleolus and left lower extremity in the region of the calf Quality: Patient reports experiencing a dull pain to affected area(s). Severity: Patient states wound are getting worse. Duration: Patient has had the wound for > 2 weeks prior to seeking treatment at the wound center Timing: Pain in wound is Intermittent (comes and goes Context: The wound appeared gradually over time Modifying Factors: Other treatment(s) tried include:seen the vascular surgeon recently and has also had a procedure done in April 2018 Associated Signs and Symptoms: Patient  reports having difficulty standing for long periods. HPI Description: 83 year old patient who was last seen by [place] in December 2017 for a sacral decubitus ulcer, Was recently seen by her PCP [name] for an ongoing wound of her left lower extremity. She had 2 wounds one on the medial malleolus and one the left calf area She had seen vascular surgery and no intervention has been recommended at this time. the patient also has edema of both lower extremities and upper extremities. She's been treated for her diabetes with insulin approximately 2 times a week. last hemoglobin A1c was 6.4% she had lower arterial studies done by [name] and the right side was noncompressible with biphasic 4 and a digital toe pressure was 0.73. On the left side she had monophasic flow with her ABI being 0.56 and her toe pressure being 0.22. 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 she had a procedure done in April 2018 with aortogram and selective left lower extremity angiogram and angioplasty. he recommended local wound care and a repeat ABIs in 6 months Addendum -- [name] has left the practice, but I managed to talk to his nurse practitioner Ms. [name], and I discussed the nature of the patient's wound and the fact that she may soon need either, an above-knee amputation or if the family decides not to do aggressive treatment she may end up with palliative care or hospice care. 04/04/2017 -- the patient's daughter has discussed with the family and the patient regarding options of hospice versus a right above-knee amputation. They have not yet made up their mind. ======== Old notes 83 year old patient who was seen recently by her PCP [name] who has been treating her for a decubitus ulcer of the sacral region, diabetes mellitus type 2, weight loss, essential hypertension. her past history is also significant for breast cancer, stroke and peripheral vascular  disease. She  had a vascular workup done in May 2017 where to do saw her for her peripheral arterial disease with rest pain in the left lower extremity and did an aortogram and selective left lower extremity angiogram. the findings were that of occlusion of the bypass which terminated in either the posterior tibial artery which was excluded or a posterior tibial artery with poor runoff. Native SFA was occluded with reconstitution of a diseased popliteal artery with the peroneal artery being best runoff distally it appears the bypass may have been based on the native SFA although it was not entirely clear. last hemoglobin A1c in March of this year was 7.1 Old note: 83 year old female with a past medical history of type 2 diabetes, hypertension, hyperlipidemia, history of breast cancer (right) and current cancer of the left breast, osteoarthritis, and glaucoma presents to the wound clinic today a consult of a wound on her sacral region. This has been there for about 3 months now. Her last hemoglobin A1c was 6.4 and her glucose was 184. She is not bed bound and is ambulating with help and uses a walker and sits on a lift chair for most of the day. 06-03-16 [name] presents today, accompanied by her daughter, for evaluation of her stage III sacral pressure ulcer. Her [name] ([id number]) daughter states there have been no changes in her overall condition: No decrease in appetite, no weight changes, no medication changes, no changes in incontinence, no activity changes. [name] states that she has been doing better with her protein intake. Her daughter states that they did receive a call from [employer] last week but no one has shown up for evaluation. We will follow-up with this. ========== Electronic Signature(s) Signed: 04/04/2017 10:27:50 AM By: [name] Fudge MD, FACS Entered By: [name] on 04/04/2017 10:27:50 [name], [name]  ([id number]) -------------------------------------------------------------------------------- Physical Exam Details Patient Name: [name] Date of Service: 04/04/2017 9:45 AM Medical Record Number: [id number] Patient Account Number: [id number] Date of Birth/Sex: [birth year] (83 y.o. Female) Treating RN: [name] Primary Care Provider: Thersa Yang Other Clinician: Referring Provider: Thersa Yang Treating Provider/Extender: [name] in Treatment: 1 Constitutional . Pulse regular. Respirations normal and unlabored. Afebrile. . Eyes Nonicteric. Reactive to light. Ears, Nose, Mouth, and Throat Lips, teeth, and gums WNL.Marland Kitchen Moist mucosa without lesions. Neck supple and nontender. No palpable supraclavicular or cervical adenopathy. Normal sized without goiter. Respiratory WNL. No retractions.. Cardiovascular Pedal Pulses WNL. No clubbing, cyanosis or edema. Lymphatic No adneopathy. No adenopathy. No adenopathy. Musculoskeletal Adexa without tenderness or enlargement.. Digits and nails w/o clubbing, cyanosis, infection, petechiae, ischemia, or inflammatory conditions.. Integumentary (Hair, Skin) No suspicious lesions. No crepitus or fluctuance. No peri-wound warmth or erythema. No masses.Marland Kitchen Psychiatric Judgement and insight Intact.. No evidence of depression, anxiety, or agitation.. Notes the stage III pressure injuries on the sacral region and the right upper buttock and right ischial tuberosity, all stable and will continue with local care with silver alginate and off loading. The necrotic wounds on the left medial calf and left medial malleolus continue to have a lot of subcutaneous and muscular debris which is treated with Santyl ointment and no sharp debridement has been attempted. She has some areas on the left lateral calf which also show signs of deep tissue injury. Electronic Signature(s) Signed: 04/04/2017 10:34:04 AM By: [name] Fudge MD, FACS Previous Signature:  04/04/2017 10:30:18 AM Version By: [name] Fudge MD, FACS Entered By: [name] on 04/04/2017 10:34:04 [name] ([id number]) -------------------------------------------------------------------------------- Physician Orders Details Patient Name: [name],  [name] Date of Service: 04/04/2017 9:45 AM Medical Record Number: [id number] Patient Account Number: [id number] Date of Birth/Sex: [birth year] (83 y.o. Female) Treating RN: Carolyne Fiscal, Debi Primary Care Provider: Thersa Yang Other Clinician: Referring Provider: Thersa Yang Treating Provider/Extender: [name] in Treatment: 1 Verbal / Phone Orders: Yes Clinician: Pinkerton, Debi Read Back and Verified: Yes Diagnosis Coding Wound Cleansing Wound #3 Left,Medial Malleolus o Clean wound with Normal Saline. o Cleanse wound with mild soap and water o May Shower, gently pat wound dry prior to applying new dressing. Wound #4 Left,Anterior Lower Leg o Clean wound with Normal Saline. o Cleanse wound with mild soap and water o May Shower, gently pat wound dry prior to applying new dressing. Wound #5 Medial Sacrum o Clean wound with Normal Saline. o Cleanse wound with mild soap and water o May Shower, gently pat wound dry prior to applying new dressing. Wound #6 Right Sacrum o Clean wound with Normal Saline. o Cleanse wound with mild soap and water o May Shower, gently pat wound dry prior to applying new dressing. Wound #7 Right Ischial Tuberosity o Clean wound with Normal Saline. o Cleanse wound with mild soap and water o May Shower, gently pat wound dry prior to applying new dressing. Anesthetic Wound #3 Left,Medial Malleolus o Topical Lidocaine 4% cream applied to wound bed prior to debridement - for clinic use Wound #4 Left,Anterior Lower Leg o Topical Lidocaine 4% cream applied to wound bed prior to debridement - for clinic use Wound #5 Medial Sacrum o Topical Lidocaine 4% cream  applied to wound bed prior to debridement - for clinic use Wound #6 Right Sacrum o Topical Lidocaine 4% cream applied to wound bed prior to debridement - for clinic use Wound #7 Right Ischial Tuberosity o Topical Lidocaine 4% cream applied to wound bed prior to debridement - for clinic use Skin Barriers/Peri-Wound Care Wound #5 Medial Sacrum [place], [name] ([id number]) o Skin Prep Wound #6 Right Sacrum o Skin Prep Wound #7 Right Ischial Tuberosity o Skin Prep Primary Wound Dressing Wound #3 Left,Medial Malleolus o Santyl Ointment Wound #4 Left,Anterior Lower Leg o Santyl Ointment Wound #5 Medial Sacrum o Silvercel Non-Adherent Wound #6 Right Sacrum o Silvercel Non-Adherent Wound #7 Right Ischial Tuberosity o Silvercel Non-Adherent Secondary Dressing Wound #3 Left,Medial Malleolus o ABD pad o Dry Gauze o Conform/[place]ix - do not wrap tight o Other - stretch netting #5 Wound #4 Left,Anterior Lower Leg o ABD pad o Dry Gauze o Conform/Kerlix - do not wrap tight o Other - stretch netting #5 Wound #5 Medial Sacrum o Boardered Foam Dressing Wound #6 Right Sacrum o Boardered Foam Dressing Wound #7 Right Ischial Tuberosity o Boardered Foam Dressing Dressing Change Frequency Wound #3 Left,Medial Malleolus o Change dressing every day. Wound #4 Left,Anterior Lower Leg o Change dressing every day. Wound #5 Medial Sacrum o Change dressing every other day. [name], [name] ([id number]) Wound #6 Right Sacrum o Change dressing every other day. Wound #7 Right Ischial Tuberosity o Change dressing every other day. Follow-up Appointments Wound #3 Left,Medial Malleolus o Return Appointment in 1 week. Wound #4 Left,Anterior Lower Leg o Return Appointment in 1 week. Wound #5 Medial Sacrum o Return Appointment in 1 week. Wound #6 Right Sacrum o Return Appointment in 1 week. Wound #7 Right Ischial Tuberosity o Return  Appointment in 1 week. Edema Control Wound #3 Left,Medial Malleolus o Elevate legs to the level of the heart and pump ankles as often as possible Wound #4 Left,Anterior Lower Leg o Elevate  legs to the level of the heart and pump ankles as often as possible Off-Loading Wound #3 Left,Medial Malleolus o Turn and reposition every 2 hours o Other: - float heels while lying in bed or sitting Wound #4 Left,Anterior Lower Leg o Turn and reposition every 2 hours o Other: - float heels while lying in bed or sitting Wound #5 Medial Sacrum o Turn and reposition every 2 hours o Other: - float heels while lying in bed or sitting Wound #6 Right Sacrum o Turn and reposition every 2 hours o Other: - float heels while lying in bed or sitting Wound #7 Right Ischial Tuberosity o Turn and reposition every 2 hours o Other: - float heels while lying in bed or sitting Additional Orders / Instructions Wound #3 Left,Medial Malleolus o Increase protein intake. Wound #4 Left,Anterior Lower Leg [name], [name] ([id number]) o Increase protein intake. Wound #5 Medial Sacrum o Increase protein intake. Wound #6 Right Sacrum o Increase protein intake. Wound #7 Right Ischial Tuberosity o Increase protein intake. Home Health Wound #3 [place] Nurse may visit PRN to address patientos wound care needs. o FACE TO FACE ENCOUNTER: MEDICARE and MEDICAID PATIENTS: I certify that this patient is under my care and that I had a face-to-face encounter that meets the physician face-to-face encounter requirements with this patient on this date. The encounter with the patient was in whole or in part for the following MEDICAL CONDITION: (primary reason for [place]) MEDICAL NECESSITY: I certify, that based on my findings, NURSING services are a medically necessary home health service. HOME BOUND STATUS: I certify that  my clinical findings support that this patient is homebound (i.e., Due to illness or injury, pt requires aid of supportive devices such as crutches, cane, wheelchairs, walkers, the use of special transportation or the assistance of another person to leave their place of residence. There is a normal inability to leave the home and doing so requires considerable and taxing effort. Other absences are for medical reasons / religious services and are infrequent or of short duration when for other reasons). o If current dressing causes regression in wound condition, may D/C ordered dressing product/s and apply Normal Saline Moist Dressing daily until next [place] / Other MD appointment. [place] of regression in wound condition at [phone]. o Please direct any NON-WOUND related issues/requests for orders to patient's Primary Care Physician Wound #4 [employer] Nurse may visit PRN to address patientos wound care needs. o FACE TO FACE ENCOUNTER: MEDICARE and MEDICAID PATIENTS: I certify that this patient is under my care and that I had a face-to-face encounter that meets the physician face-to-face encounter requirements with this patient on this date. The encounter with the patient was in whole or in part for the following MEDICAL CONDITION: (primary reason for [place]) MEDICAL NECESSITY: I certify, that based on my findings, NURSING services are a medically necessary home health service. HOME BOUND STATUS: I certify that my clinical findings support that this patient is homebound (i.e., Due to illness or injury, pt requires aid of supportive devices such as crutches, cane, wheelchairs, walkers, the use of special transportation or the assistance of another person to leave their place of residence. There is a normal inability to leave the home and doing so requires considerable and taxing  effort. Other absences are for medical reasons / religious services and are infrequent  or of short duration when for other reasons). o If current dressing causes regression in wound condition, may D/C ordered dressing product/s and apply Normal Saline Moist Dressing daily until next [place] / Other MD appointment. [place] of regression in wound condition at [phone]. o Please direct any NON-WOUND related issues/requests for orders to patient's Primary Care Physician Wound #5 Medial [place] Nurse may visit PRN to address patientos wound care needs. o FACE TO FACE ENCOUNTER: MEDICARE and MEDICAID PATIENTS: I certify that this patient is under my care and that I had a face-to-face encounter that meets the physician face-to-face encounter requirements with this patient on this date. The encounter with the patient was in whole or in part for the following MEDICAL CONDITION: (primary reason for [place]) MEDICAL NECESSITY: I certify, that based on my findings, NURSING services are a medically necessary home health service. HOME BOUND STATUS: I certify that my clinical findings support that this patient is homebound (i.e., Due to illness or injury, pt requires aid of supportive devices such as crutches, cane, wheelchairs, walkers, the use of special transportation or the [place] ([id number]) assistance of another person to leave their place of residence. There is a normal inability to leave the home and doing so requires considerable and taxing effort. Other absences are for medical reasons / religious services and are infrequent or of short duration when for other reasons). o If current dressing causes regression in wound condition, may D/C ordered dressing product/s and apply Normal Saline Moist Dressing daily until next [place] / Other MD appointment. [place] of  regression in wound condition at [phone]. o Please direct any NON-WOUND related issues/requests for orders to patient's Primary Care Physician Wound #6 [place] Nurse may visit PRN to address patientos wound care needs. o FACE TO FACE ENCOUNTER: MEDICARE and MEDICAID PATIENTS: I certify that this patient is under my care and that I had a face-to-face encounter that meets the physician face-to-face encounter requirements with this patient on this date. The encounter with the patient was in whole or in part for the following MEDICAL CONDITION: (primary reason for [place]) MEDICAL NECESSITY: I certify, that based on my findings, NURSING services are a medically necessary home health service. HOME BOUND STATUS: I certify that my clinical findings support that this patient is homebound (i.e., Due to illness or injury, pt requires aid of supportive devices such as crutches, cane, wheelchairs, walkers, the use of special transportation or the assistance of another person to leave their place of residence. There is a normal inability to leave the home and doing so requires considerable and taxing effort. Other absences are for medical reasons / religious services and are infrequent or of short duration when for other reasons). o If current dressing causes regression in wound condition, may D/C ordered dressing product/s and apply Normal Saline Moist Dressing daily until next [place] / Other MD appointment. [place] of regression in wound condition at [phone]. o Please direct any NON-WOUND related issues/requests for orders to patient's Primary Care Physician Wound #7 Right Ischial [place] Nurse may visit PRN to address patientos wound care needs. o FACE TO FACE ENCOUNTER: MEDICARE and MEDICAID PATIENTS: I certify that this patient is under my  care and that I had a face-to-face encounter that meets  the physician face-to-face encounter requirements with this patient on this date. The encounter with the patient was in whole or in part for the following MEDICAL CONDITION: (primary reason for [place]) MEDICAL NECESSITY: I certify, that based on my findings, NURSING services are a medically necessary home health service. HOME BOUND STATUS: I certify that my clinical findings support that this patient is homebound (i.e., Due to illness or injury, pt requires aid of supportive devices such as crutches, cane, wheelchairs, walkers, the use of special transportation or the assistance of another person to leave their place of residence. There is a normal inability to leave the home and doing so requires considerable and taxing effort. Other absences are for medical reasons / religious services and are infrequent or of short duration when for other reasons). o If current dressing causes regression in wound condition, may D/C ordered dressing product/s and apply Normal Saline Moist Dressing daily until next [place] / Other MD appointment. [place] of regression in wound condition at [phone]. o Please direct any NON-WOUND related issues/requests for orders to patient's Primary Care Physician Medications-please add to medication list. Wound #3 Left,Medial Malleolus o Other: - Vitamin C, Zinc, Vitamin A Wound #4 Left,Anterior Lower Leg o Other: - Vitamin C, Zinc, Vitamin A Wound #5 Medial Sacrum o Other: - Vitamin C, Zinc, Vitamin A Wound #6 Right Sacrum o Other: - Vitamin C, Zinc, Vitamin A Wound #7 Right Ischial [name], [name] ([id number]) o Other: - Vitamin C, Zinc, Vitamin A Electronic Signature(s) Signed: 04/04/2017 12:49:35 PM By: [name] Fudge MD, FACS Signed: 04/04/2017 4:51:17 PM By: Alric Quan Entered By: Alric Quan on 04/04/2017 10:24:50 [name], [name]  ([id number]) -------------------------------------------------------------------------------- Problem List Details Patient Name: [name] Date of Service: 04/04/2017 9:45 AM Medical Record Number: [id number] Patient Account Number: [id number] Date of Birth/Sex: 1922-04-01 (83 y.o. 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 03/24/2017 Yes L89.153 Pressure ulcer of sacral region, stage 3 03/24/2017 Yes L89.313 Pressure ulcer of right buttock, stage 3 03/24/2017 Yes I70.242 Atherosclerosis of native arteries of left leg with ulceration of calf 03/24/2017 Yes Inactive Problems Resolved Problems Electronic Signature(s) Signed: 04/04/2017 10:26:54 AM By: [name] Fudge MD, FACS Entered By: [name] on 04/04/2017 10:26:54 [name] ([id number]) -------------------------------------------------------------------------------- Progress Note Details Patient Name: [name] Date of Service: 04/04/2017 9:45 AM Medical Record Number: [id number] Patient Account Number: [id number] Date of Birth/Sex: 07-25-1921 (83 y.o. Female) Treating RN: [name] Primary Care Provider: Thersa Yang Other Clinician: Referring Provider: Thersa Yang Treating Provider/Extender: [name] in Treatment: 1 Subjective Chief Complaint Information obtained from Patient Patient returns to the wound care center for new ulcer to: the sacral region, right buttock, left medial ankle left medial calf, which  she's had for about 2 weeks now History of Present Illness (HPI) The following HPI elements were documented for the patient's wound: Location: sacral region,right buttock, right ischial tuberosity,left medial malleolus and left lower extremity in the region of the calf Quality: Patient reports experiencing a dull pain to affected area(s). Severity: Patient states wound are getting worse. Duration: Patient has had  the wound for > 2 weeks prior to seeking treatment at the wound center Timing: Pain in wound is Intermittent (comes and goes Context: The wound appeared gradually over time Modifying Factors: Other treatment(s) tried include:seen the vascular surgeon recently and has also had a procedure done in April 2018 Associated Signs and Symptoms: Patient reports having difficulty standing for long periods. 83 year old patient who was last seen by [place] in December 2017 for a sacral decubitus ulcer, Was recently seen by her PCP [name] for an ongoing wound of her left lower extremity. She had 2 wounds one on the medial malleolus and one the left calf area She had seen vascular surgery and no intervention has been recommended at this time. the patient also has edema of both lower extremities and upper extremities. She's been treated for her diabetes with insulin approximately 2 times a week. last hemoglobin A1c was 6.4% she had lower arterial studies done by [name] and the right side was noncompressible with biphasic 4 and a digital toe pressure was 0.73. On the left side she had monophasic flow with her ABI being 0.56 and her toe pressure being 0.22. 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 she had a procedure done in April 2018 with aortogram and selective left lower extremity angiogram and angioplasty. he recommended local wound care and a repeat ABIs in 6 months Addendum -- [name] has left the practice, but I managed to talk to his nurse  practitioner Ms. [name], and I discussed the nature of the patient's wound and the fact that she may soon need either, an above-knee amputation or if the family decides not to do aggressive treatment she may end up with palliative care or hospice care. 04/04/2017 -- the patient's daughter has discussed with the family and the patient regarding options of hospice versus a right above-knee amputation. They have not yet made up their mind. ======== Old notes 83 year old patient who was seen recently by her PCP [name] who has been treating her for a decubitus ulcer of the sacral region, diabetes mellitus type 2, weight loss, essential hypertension. her past history is also significant for breast cancer, stroke and peripheral vascular disease. She had a vascular workup done in May 2017 where to do saw her for her peripheral arterial disease with rest pain in the left lower extremity and did an aortogram and selective left lower extremity angiogram. the findings were that of occlusion of the bypass which terminated in either the posterior tibial artery which was excluded or a posterior tibial artery with poor runoff. Native SFA was occluded with reconstitution of a diseased popliteal artery with the peroneal artery being best runoff distally it appears the bypass may have been based on the native SFA although it was not entirely clear. KARISA, NESSER ([id number]) last hemoglobin A1c in March of this year was 7.1 Old note: 83 year old female with a past medical history of type 2 diabetes, hypertension, hyperlipidemia, history of breast cancer (right) and current cancer of the left breast, osteoarthritis, and glaucoma presents to the wound clinic today a consult of a wound on her sacral region. This has been there for about 3 months now. Her last hemoglobin A1c was 6.4 and her glucose was 184. She is not bed bound and is ambulating with help and uses a walker and sits on a lift chair for most of the  day. 06-03-16 Ms. Sherbert presents today, accompanied by her daughter,  for evaluation of her stage III sacral pressure ulcer. Her daughter states there have been no changes in her overall condition: No decrease in appetite, no weight changes, no medication changes, no changes in incontinence, no activity changes. Ms. Freiman states that she has been doing better with her protein intake. Her daughter states that they did receive a call from [facility] last week but no one has shown up for evaluation. We will follow-up with this. ========== Objective Constitutional Pulse regular. Respirations normal and unlabored. Afebrile. Vitals Time Taken: 9:49 AM, Height: 60 in, Weight: 157 lbs, BMI: 30.7, Temperature: 97.8 F, Pulse: 96 bpm, Respiratory Rate: 16 breaths/min, Blood Pressure: 134/52 mmHg. Eyes Nonicteric. Reactive to light. Ears, Nose, Mouth, and Throat Lips, teeth, and gums WNL.Marland Kitchen Moist mucosa without lesions. Neck supple and nontender. No palpable supraclavicular or cervical adenopathy. Normal sized without goiter. Respiratory WNL. No retractions.. Cardiovascular Pedal Pulses WNL. No clubbing, cyanosis or edema. Lymphatic No adneopathy. No adenopathy. No adenopathy. Musculoskeletal Adexa without tenderness or enlargement.. Digits and nails w/o clubbing, cyanosis, infection, petechiae, ischemia, or inflammatory conditions.Marland Kitchen Psychiatric Judgement and insight Intact.. No evidence of depression, anxiety, or agitation.. General Notes: the stage III pressure injuries on the sacral region and the right upper buttock and right ischial tuberosity, all stable and will continue with local care with silver alginate and off loading. The necrotic wounds on the left medial calf and left [name], [name] ([id number]) medial malleolus continue to have a lot of subcutaneous and muscular debris which is treated with Santyl ointment and no sharp debridement has been attempted. She has some areas on the left  lateral calf which also show signs of deep tissue injury. Integumentary (Hair, Skin) No suspicious lesions. No crepitus or fluctuance. No peri-wound warmth or erythema. No masses.. Wound #3 status is Open. Original cause of wound was Gradually Appeared. The wound is located on the Left,Medial Malleolus. The wound measures 2cm length x 2.5cm width x 0.6cm depth; 3.927cm^2 area and 2.356cm^3 volume. Wound #4 status is Open. Original cause of wound was Gradually Appeared. The wound is located on the Left,Anterior Lower Leg. The wound measures 8cm length x 0.5cm width x 0.9cm depth; 3.142cm^2 area and 2.827cm^3 volume. Wound #5 status is Open. Original cause of wound was Pressure Injury. The wound is located on the Medial Sacrum. The wound measures 2.5cm length x 5cm width x 0.1cm depth; 9.817cm^2 area and 0.982cm^3 volume. Wound #6 status is Open. Original cause of wound was Pressure Injury. The wound is located on the Right Sacrum. The wound measures 0.3cm length x 2.5cm width x 0.1cm depth; 0.589cm^2 area and 0.059cm^3 volume. Wound #7 status is Open. Original cause of wound was Pressure Injury. The wound is located on the Right Ischial Tuberosity. The wound measures 0.8cm length x 1cm width x 0.1cm depth; 0.628cm^2 area and 0.063cm^3 volume. Wound #8 status is Open. Original cause of wound was Pressure Injury. The wound is located on the Left,Medial Sacrum. The wound measures 0.6cm length x 0.7cm width x 0.1cm depth; 0.33cm^2 area and 0.033cm^3 volume. There is no tunneling or undermining noted. There is a small amount of sanguinous drainage noted. The wound margin is flat and intact. There is medium (34-66%) red granulation within the wound bed. There is a small (1-33%) amount of necrotic tissue within the wound bed including Eschar. The periwound skin appearance exhibited: Ecchymosis. The periwound skin appearance did not exhibit: Callus, Crepitus, Excoriation, Induration, Rash, Scarring,  Dry/Scaly, Maceration, Atrophie Blanche, Cyanosis, Hemosiderin Staining, Mottled, Pallor, Rubor,  Erythema. Periwound temperature was noted as No Abnormality. Assessment Active Problems ICD-10 E11.622 - Type 2 diabetes mellitus with other skin ulcer I73.9 - Peripheral vascular disease, unspecified L97.223 - Non-pressure chronic ulcer of left calf with necrosis of muscle L97.323 - Non-pressure chronic ulcer of left ankle with necrosis of muscle L89.153 - Pressure ulcer of sacral region, stage 3 L89.313 - Pressure ulcer of right buttock, stage 3 I70.242 - Atherosclerosis of native arteries of left leg with ulceration of calf Plan Wound Cleansing: Wound #3 Left,Medial Malleolus: Clean wound with Normal Saline. Cleanse wound with mild soap and water May Shower, gently pat wound dry prior to applying new dressing. [name], [name] ([id number]) Wound #4 Left,Anterior Lower Leg: Clean wound with Normal Saline. Cleanse wound with mild soap and water May Shower, gently pat wound dry prior to applying new dressing. Wound #5 Medial Sacrum: Clean wound with Normal Saline. Cleanse wound with mild soap and water May Shower, gently pat wound dry prior to applying new dressing. Wound #6 Right Sacrum: Clean wound with Normal Saline. Cleanse wound with mild soap and water May Shower, gently pat wound dry prior to applying new dressing. Wound #7 Right Ischial Tuberosity: Clean wound with Normal Saline. Cleanse wound with mild soap and water May Shower, gently pat wound dry prior to applying new dressing. Anesthetic: Wound #3 Left,Medial Malleolus: Topical Lidocaine 4% cream applied to wound bed prior to debridement - for clinic use Wound #4 Left,Anterior Lower Leg: Topical Lidocaine 4% cream applied to wound bed prior to debridement - for clinic use Wound #5 Medial Sacrum: Topical Lidocaine 4% cream applied to wound bed prior to debridement - for clinic use Wound #6 Right Sacrum: Topical Lidocaine  4% cream applied to wound bed prior to debridement - for clinic use Wound #7 Right Ischial Tuberosity: Topical Lidocaine 4% cream applied to wound bed prior to debridement - for clinic use Skin Barriers/Peri-Wound Care: Wound #5 Medial Sacrum: Skin Prep Wound #6 Right Sacrum: Skin Prep Wound #7 Right Ischial Tuberosity: Skin Prep Primary Wound Dressing: Wound #3 Left,Medial Malleolus: Santyl Ointment Wound #4 Left,Anterior Lower Leg: Santyl Ointment Wound #5 Medial Sacrum: Silvercel Non-Adherent Wound #6 Right Sacrum: Silvercel Non-Adherent Wound #7 Right Ischial Tuberosity: [place] Non-Adherent Secondary Dressing: Wound #3 Left,Medial Malleolus: ABD pad Dry Gauze Conform/Kerlix - do not wrap tight Other - stretch netting #5 Wound #4 Left,Anterior Lower Leg: ABD pad Dry Gauze Conform/Kerlix - do not wrap tight Other - stretch netting #5 Wound #5 Medial Sacrum: Boardered Foam Dressing Wound #6 Right Sacrum: Boardered Foam Dressing [name], Eila ([id number]) Wound #7 Right Ischial Tuberosity: Boardered Foam Dressing Dressing Change Frequency: Wound #3 Left,Medial Malleolus: Change dressing every day. Wound #4 Left,Anterior Lower Leg: Change dressing every day. Wound #5 Medial Sacrum: Change dressing every other day. Wound #6 Right Sacrum: Change dressing every other day. Wound #7 Right Ischial Tuberosity: Change dressing every other day. Follow-up Appointments: Wound #3 Left,Medial Malleolus: Return Appointment in 1 week. Wound #4 Left,Anterior Lower Leg: Return Appointment in 1 week. Wound #5 Medial Sacrum: Return Appointment in 1 week. Wound #6 Right Sacrum: Return Appointment in 1 week. Wound #7 Right Ischial Tuberosity: Return Appointment in 1 week. Edema Control: Wound #3 Left,Medial Malleolus: Elevate legs to the level of the heart and pump ankles as often as possible Wound #4 Left,Anterior Lower Leg: Elevate legs to the level of the heart and pump  ankles as often as possible Off-Loading: Wound #3 Left,Medial Malleolus: Turn and reposition every 2 hours Other: -  float heels while lying in bed or sitting Wound #4 Left,Anterior Lower Leg: Turn and reposition every 2 hours Other: - float heels while lying in bed or sitting Wound #5 Medial Sacrum: Turn and reposition every 2 hours Other: - float heels while lying in bed or sitting Wound #6 Right Sacrum: Turn and reposition every 2 hours Other: - float heels while lying in bed or sitting Wound #7 Right Ischial Tuberosity: Turn and reposition every 2 hours Other: - float heels while lying in bed or sitting Additional Orders / Instructions: Wound #3 Left,Medial Malleolus: Increase protein intake. Wound #4 Left,Anterior Lower Leg: Increase protein intake. Wound #5 Medial Sacrum: Increase protein intake. Wound #6 Right Sacrum: Increase protein intake. Wound #7 Right Ischial Tuberosity: Increase protein intake. Home Health: Wound #3 Left,Medial Malleolus: [place] Nurse may visit PRN to address patient s wound care needs. [name], VORHEES ([id number]) FACE TO FACE ENCOUNTER: MEDICARE and MEDICAID PATIENTS: I certify that this patient is under my care and that I had a face-to-face encounter that meets the physician face-to-face encounter requirements with this patient on this date. The encounter with the patient was in whole or in part for the following MEDICAL CONDITION: (primary reason for [place]) MEDICAL NECESSITY: I certify, that based on my findings, NURSING services are a medically necessary home health service. HOME BOUND STATUS: I certify that my clinical findings support that this patient is homebound (i.e., Due to illness or injury, pt requires aid of supportive devices such as crutches, cane, wheelchairs, walkers, the use of special transportation or the assistance of another person to leave their place of residence. There is a normal  inability to leave the home and doing so requires considerable and taxing effort. Other absences are for medical reasons / religious services and are infrequent or of short duration when for other reasons). If current dressing causes regression in wound condition, may D/C ordered dressing product/s and apply Normal Saline Moist Dressing daily until next [place] / Other MD appointment. [place] of regression in wound condition at [phone]. Please direct any NON-WOUND related issues/requests for orders to patient's Primary Care Physician Wound #4 Left,Anterior Lower Leg: [place] Nurse may visit PRN to address patient s wound care needs. FACE TO FACE ENCOUNTER: MEDICARE and MEDICAID PATIENTS: I certify that this patient is under my care and that I had a face-to-face encounter that meets the physician face-to-face encounter requirements with this patient on this date. The encounter with the patient was in whole or in part for the following MEDICAL CONDITION: (primary reason for [place]) MEDICAL NECESSITY: I certify, that based on my findings, NURSING services are a medically necessary home health service. HOME BOUND STATUS: I certify that my clinical findings support that this patient is homebound (i.e., Due to illness or injury, pt requires aid of supportive devices such as crutches, cane, wheelchairs, walkers, the use of special transportation or the assistance of another person to leave their place of residence. There is a normal inability to leave the home and doing so requires considerable and taxing effort. Other absences are for medical reasons / religious services and are infrequent or of short duration when for other reasons). If current dressing causes regression in wound condition, may D/C ordered dressing product/s and apply Normal Saline Moist Dressing daily until next [place] / Other MD appointment.  [place] of regression in wound condition at [phone].  Please direct any NON-WOUND related issues/requests for orders to patient's Primary Care Physician Wound #5 Medial Sacrum: [place] Nurse may visit PRN to address patient s wound care needs. FACE TO FACE ENCOUNTER: MEDICARE and MEDICAID PATIENTS: I certify that this patient is under my care and that I had a face-to-face encounter that meets the physician face-to-face encounter requirements with this patient on this date. The encounter with the patient was in whole or in part for the following MEDICAL CONDITION: (primary reason for [place]) MEDICAL NECESSITY: I certify, that based on my findings, NURSING services are a medically necessary home health service. HOME BOUND STATUS: I certify that my clinical findings support that this patient is homebound (i.e., Due to illness or injury, pt requires aid of supportive devices such as crutches, cane, wheelchairs, walkers, the use of special transportation or the assistance of another person to leave their place of residence. There is a normal inability to leave the home and doing so requires considerable and taxing effort. Other absences are for medical reasons / religious services and are infrequent or of short duration when for other reasons). If current dressing causes regression in wound condition, may D/C ordered dressing product/s and apply Normal Saline Moist Dressing daily until next [place] / Other MD appointment. [place] of regression in wound condition at [phone]. Please direct any NON-WOUND related issues/requests for orders to patient's Primary Care Physician Wound #6 Right Sacrum: [place] Nurse may visit PRN to address patient s wound care needs. FACE TO FACE ENCOUNTER: MEDICARE and MEDICAID PATIENTS: I certify that this patient is under my care and that  I had a face-to-face encounter that meets the physician face-to-face encounter requirements with this patient on this date. The encounter with the patient was in whole or in part for the following MEDICAL CONDITION: (primary reason for [place]) MEDICAL NECESSITY: I certify, that based on my findings, NURSING services are a medically necessary home health service. HOME BOUND STATUS: I certify that my clinical findings support that this patient is homebound (i.e., Due to illness or injury, pt requires aid of supportive devices such as crutches, cane, wheelchairs, walkers, the use of special transportation or the assistance of another person to leave their place of residence. There is a normal inability to leave the home and doing so requires considerable and taxing effort. Other absences are for medical reasons / religious services and are infrequent or of short duration when for other reasons). If current dressing causes regression in wound condition, may D/C ordered dressing product/s and apply Normal Saline Moist Dressing daily until next [place] / Other MD appointment. [place] of regression in wound condition at [phone]. SHANECE, COCHRANE ([id number]) Please direct any NON-WOUND related issues/requests for orders to patient's Primary Care Physician Wound #7 Right Ischial Tuberosity: [place] Nurse may visit PRN to address patient s wound care needs. FACE TO FACE ENCOUNTER: MEDICARE and MEDICAID PATIENTS: I certify that this patient is under my care and that I had a face-to-face encounter that meets the physician face-to-face encounter requirements with this patient on this date. The encounter with the patient was in whole or in part for the following MEDICAL CONDITION: (primary reason for [place]) MEDICAL NECESSITY: I certify, that based on my findings, NURSING services are a medically necessary home health service.  HOME BOUND STATUS: I certify that my clinical findings support  that this patient is homebound (i.e., Due to illness or injury, pt requires aid of supportive devices such as crutches, cane, wheelchairs, walkers, the use of special transportation or the assistance of another person to leave their place of residence. There is a normal inability to leave the home and doing so requires considerable and taxing effort. Other absences are for medical reasons / religious services and are infrequent or of short duration when for other reasons). If current dressing causes regression in wound condition, may D/C ordered dressing product/s and apply Normal Saline Moist Dressing daily until next [place] / Other MD appointment. [place] of regression in wound condition at [phone]. Please direct any NON-WOUND related issues/requests for orders to patient's Primary Care Physician Medications-please add to medication list.: Wound #3 Left,Medial Malleolus: Other: - Vitamin C, Zinc, Vitamin A Wound #4 Left,Anterior Lower Leg: Other: - Vitamin C, Zinc, Vitamin A Wound #5 Medial Sacrum: Other: - Vitamin C, Zinc, Vitamin A Wound #6 Right Sacrum: Other: - Vitamin C, Zinc, Vitamin A Wound #7 Right Ischial Tuberosity: Other: - Vitamin C, Zinc, Vitamin A the patient's daughter, who is the caregiver and I have had a detailed discussion about the patient's care. After review have recommended: 1. Silver alginate and bordered foam to the sacral and gluteal area and constant offloading has been discussed with them in great detail 2. continue oral antibiotics as prescribed by her PCP 3. Santyl ointment locally to the left lower extremity wounds covered with a bordered foam 4. Adequate protein, vitamin A, vitamin C and zinc 5. we have again discussed the options of hospice versus left below-knee or above-knee amputation. They are yet to make up their minds I will try to speak to the  patient's PCP about the patient's general situation. Electronic Signature(s) Signed: 04/04/2017 12:49:19 PM By: [name] Fudge MD, FACS Previous Signature: 04/04/2017 10:32:57 AM Version By: [name] Fudge MD, FACS Entered By: [name] on 04/04/2017 12:49:19 Tyminski, [name] ([id number]) -------------------------------------------------------------------------------- SuperBill Details Patient Name: [name] Date of Service: 04/04/2017 Medical Record Number: [id number] Patient Account Number: [id number] Date of Birth/Sex: [birth year] (83 y.o. 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 L89.153 Pressure ulcer of sacral region, stage 3 L89.313 Pressure ulcer of right buttock, stage 3 I70.242 Atherosclerosis of native arteries of left leg with ulceration of calf Facility Procedures CPT4 Code: [id number] Description: [phone] - WOUND CARE VISIT-LEV 5 EST PT Modifier: Quantity: 1 Physician Procedures CPT4 Code: [id number] Description: 34742 - WC PHYS LEVEL 3 - EST PT ICD-10 Diagnosis Description E11.622 Type 2 diabetes mellitus with other skin ulcer I73.9 Peripheral vascular disease, unspecified L97.223 Non-pressure chronic ulcer of left calf with necrosis of mus L97.323  Non-pressure chronic ulcer of left ankle with necrosis of mu Modifier: cle scle Quantity: 1 Electronic Signature(s) Signed: 04/04/2017 2:32:39 PM By: Alric Quan Signed: 04/04/2017 4:00:40 PM By: [name] Fudge MD, FACS Previous Signature: 04/04/2017 10:35:31 AM Version By: [name] Fudge MD,  FACS Entered By: Alric Quan on 04/04/2017 14:32:38

## 2017-04-12 ENCOUNTER — Encounter (INDEPENDENT_AMBULATORY_CARE_PROVIDER_SITE_OTHER): Payer: Self-pay

## 2017-04-12 ENCOUNTER — Encounter (INDEPENDENT_AMBULATORY_CARE_PROVIDER_SITE_OTHER): Payer: Self-pay | Admitting: Vascular Surgery

## 2017-04-12 ENCOUNTER — Other Ambulatory Visit (INDEPENDENT_AMBULATORY_CARE_PROVIDER_SITE_OTHER): Payer: Self-pay | Admitting: Vascular Surgery

## 2017-04-12 ENCOUNTER — Ambulatory Visit (INDEPENDENT_AMBULATORY_CARE_PROVIDER_SITE_OTHER): Payer: Medicare PPO | Admitting: Vascular Surgery

## 2017-04-12 VITALS — BP 141/67 | HR 107 | Resp 14 | Ht 60.0 in | Wt 152.0 lb

## 2017-04-12 DIAGNOSIS — I10 Essential (primary) hypertension: Secondary | ICD-10-CM

## 2017-04-12 DIAGNOSIS — L89152 Pressure ulcer of sacral region, stage 2: Secondary | ICD-10-CM

## 2017-04-12 DIAGNOSIS — I739 Peripheral vascular disease, unspecified: Secondary | ICD-10-CM

## 2017-04-12 DIAGNOSIS — E785 Hyperlipidemia, unspecified: Secondary | ICD-10-CM | POA: Diagnosis not present

## 2017-04-12 DIAGNOSIS — I7025 Atherosclerosis of native arteries of other extremities with ulceration: Secondary | ICD-10-CM

## 2017-04-12 DIAGNOSIS — E1149 Type 2 diabetes mellitus with other diabetic neurological complication: Secondary | ICD-10-CM

## 2017-04-12 NOTE — Assessment & Plan Note (Signed)
Sees the wound care center

## 2017-04-12 NOTE — Assessment & Plan Note (Signed)
This is clearly a critical and limb threatening situation.  I had a very long talk today with the patient and multiple family members regarding the situation and the fact that she does not have exorbitant pain is encouraging and may allow [place] to continue with local wound care and avoid major amputation in the short-term.  I do not think these wounds are likely to heal given her poor perfusion, but a small percentage of wounds particularly more proximal on the leg will heal even with poor perfusion.  The wound care center has recommended debridement and the family says that they would prefer me to do this in the operating room which is reasonable.  I am happy to do this.  This will be scheduled at their convenience in the near future.  I think the yield of an angiogram at this point is fairly low although I have told them that I would be happy to offer this rather than limb loss in the future if they would prefer.

## 2017-04-12 NOTE — Progress Notes (Addendum)
MRN : [id number]  [name] is a 83 y.o. (Mar 22, 1922) female who presents with chief complaint of  Chief Complaint  Patient presents with  . Follow-up    Discuss next steps for LE  .  History of Present Illness: Patient returns today in follow up of several nonhealing ulcerations of the.  This is been an ongoing and worsening problem over many months.  She denies any fever or chills.  She says she has some pain although it is not intolerable.  She has developed sacral decubiti as well due to her immobility.  She reports no trauma or injury.  She has severe peripheral vascular disease that is basically non-reconstructable at this time.  She has had a previous failed surgical bypass and I have performed an bypass and her native circulation neither of which were durable.  Current Outpatient Prescriptions  Medication Sig Dispense Refill  . Alcohol Swabs (B-D SINGLE USE SWABS REGULAR) PADS Use as directed. 100 each 11  . amLODipine (NORVASC) 5 MG tablet TAKE 1 TABLET EVERY DAY (Patient taking differently: TAKE 56m EVERY DAY) 90 tablet 2  . aspirin 81 MG EC tablet Take 81 mg by mouth daily at 12 noon. Takes at noon    . B-D UF III MINI PEN NEEDLES 31G X 5 MM MISC Use when checking blood sugar. 100 each 11  . blood glucose meter kit and supplies Dispense a Accu Check meter..  E11.29 1 each 0  . brimonidine (ALPHAGAN P) 0.1 % SOLN PLACE 1 DROP INTO BOTH EYES 2 (TWO) TIMES DAILY. (Patient not taking: Reported on 04/12/2017) 5 mL 1  . Calcium Carb-Cholecalciferol (CALCIUM 600 + D PO) Take 1 tablet by mouth daily.     . Cholecalciferol (VITAMIN D3) 1000 UNITS CAPS Take 1,000 Units by mouth daily.     . clopidogrel (PLAVIX) 75 MG tablet TAKE 1 TABLET EVERY DAY 90 tablet 2  . CRANBERRY PO Take 1 capsule by mouth daily.     .Marland KitchenDIAPER RASH PRODUCTS EX Apply 1 application topically daily as needed (sores, diaper rash).     .Marland Kitchenexemestane (AROMASIN) 25 MG tablet TAKE 1 TABLET EVERY DAY AT BEDTIME (Patient  taking differently: TAKE 218m EVERY DAY AT BEDTIME) 90 tablet 6  . Fluocinolone Acetonide 0.01 % SHAM Apply to scalp once daily; work into laCarMaxnd allow to remain on scalp for ~5 minutes then rinse with water. (Patient taking differently: Apply 1 application topically every other day. Apply to scalp every other day; work into laCarMaxnd allow to remain on scalp for ~5 minutes then rinse with water.) 120 mL 1  . glucose blood (COOL BLOOD GLUCOSE TEST STRIPS) test strip Use as instructed 100 each 12  . hydrALAZINE (APRESOLINE) 50 MG tablet TAKE 1 TABLET FOUR TIMES DAILY (Patient taking differently: TAKE 5066mOUR TIMES DAILY) 360 tablet 2  . ketoconazole (NIZORAL) 2 % shampoo Apply 1 application topically every other day.     . Lancets (ACCU-CHEK SOFT TOUCH) lancets Use as instructed 100 each 12  . LUMIGAN 0.01 % SOLN PLACE 1 DROP INTO BOTH EYES AT BEDTIME. 3 Bottle 0  . Multiple Vitamins-Minerals (MULTIVITAMIN GUMMIES ADULT PO) Take 1 tablet by mouth daily.     . oMarland Kitcheneprazole (PRILOSEC) 20 MG capsule TAKE 1 CAPSULE EVERY DAY (Patient taking differently: TAKE 22m58mERY DAY) 90 capsule 2  . ranitidine (ZANTAC) 150 MG capsule TAKE 1 CAPSULE TWICE DAILY (Patient taking differently: TAKE 150mg47mCE DAILY) 180 capsule  2  . SIMBRINZA 1-0.2 % SUSP Place 1 drop into both eyes 2 (two) times daily.     . traMADol (ULTRAM) 50 MG tablet Take 1-2 tablets (50-100 mg total) by mouth every 8 (eight) hours as needed. (Patient taking differently: Take 50-100 mg by mouth every 8 (eight) hours as needed for moderate pain. ) 90 tablet 1  . Calcium Polycarbophil (FIBERCON PO) Take 1 tablet by mouth every other day.    . magnesium hydroxide (MILK OF MAGNESIA) 400 MG/5ML suspension Take 30 mLs by mouth daily as needed for constipation.    Marland Kitchen SANTYL ointment Apply 1 application topically daily. Apply to left shin wound with each dressing change.  0  . zinc gluconate 50 MG tablet Take 50 mg by mouth daily.     No current  facility-administered medications for this visit.     Past Medical History:  Diagnosis Date  . Anxiety state 01/01/2015  . Arthritis    "all over"  . Breast cancer ([place])    Hx of R breast cancer s/p mastectomy; Currently has L breast cancer (Diagnosed 2015).   . Breast cancer, left breast ([place]) 01/01/2015  . CKD (chronic kidney disease), stage III ([place]) 02/18/2015  . Decubitus ulcer of sacral region, stage 2 01/01/2015  . Decubitus ulcer, buttock   . Diabetes mellitus with insulin therapy ([place]) 01/01/2015  . Essential hypertension 01/01/2015  . Glaucoma 01/01/2015  . Glaucoma, bilateral   . History of breast cancer 01/01/2015  . History of DVT (deep vein thrombosis) 1980's   BLE  . HLD (hyperlipidemia) 01/01/2015  . Hyperlipidemia   . Hypertension   . Itchy scalp 02/10/2015  . Osteoarthritis 01/01/2015  . Recurrent UTI 03/06/2015  . Right knee pain 02/10/2015  . TIA (transient ischemic attack) 02/18/2015  . Type II diabetes mellitus (HCC)    Insulin dependent.     Past Surgical History:  Procedure Laterality Date  . ABDOMINAL HYSTERECTOMY    . BREAST BIOPSY Right 1980's   +  . BREAST EXCISIONAL BIOPSY Left 2015   + stereo no surgery  . CHOLECYSTECTOMY    . LOWER EXTREMITY ANGIOGRAPHY Left 09/30/2016   Procedure: Lower Extremity Angiography;  Surgeon: Algernon Huxley, MD;  Location: [facility];  Service: Cardiovascular;  Laterality: Left;  . LOWER EXTREMITY INTERVENTION  09/30/2016   Procedure: Lower Extremity Intervention;  Surgeon: Algernon Huxley, MD;  Location: [facility];  Service: Cardiovascular;;  . MASTECTOMY Right 1980's  . MEDIAL PARTIAL KNEE REPLACEMENT Right 1980's?  . PERIPHERAL VASCULAR CATHETERIZATION  04/28/2015   Procedure: Lower Extremity Intervention;  Surgeon: Algernon Huxley, MD;  Location: [facility];  Service: Cardiovascular;;  . PERIPHERAL VASCULAR CATHETERIZATION N/A 04/28/2015   Procedure: Abdominal Aortogram w/Lower Extremity;  Surgeon:  Algernon Huxley, MD;  Location: [facility];  Service: Cardiovascular;  Laterality: N/A;  . PERIPHERAL VASCULAR CATHETERIZATION Left 10/20/2015   Procedure: Lower Extremity Angiography;  Surgeon: Algernon Huxley, MD;  Location: [facility];  Service: Cardiovascular;  Laterality: Left;  . PERIPHERAL VASCULAR CATHETERIZATION  10/20/2015   Procedure: Lower Extremity Intervention;  Surgeon: Algernon Huxley, MD;  Location: [facility];  Service: Cardiovascular;;     Social History     Social History  Substance Use Topics  . Smoking status: Never Smoker  . Smokeless tobacco: Never Used  . Alcohol use No    Family History      Family History  Problem Relation Age of Onset  . Heart disease Mother   . Diabetes Son   . Diabetes Son   . Kidney disease Son   . Kidney cancer Neg Hx   . Bladder Cancer Neg Hx   . Prostate cancer Neg Hx     No Known Allergies   REVIEW OF SYSTEMS(Negative unless checked)  Constitutional: _0 Weight loss_1 Fever_2 Chills Cardiac:_3 Chest pain_4 Chest pressure_5 Palpitations _6 Shortness of breath when laying flat _7 Shortness of breath at rest _8 Shortness of breath with exertion. Vascular: _9 Pain in legs with walking_10 Pain in legsat rest_11 Pain in legs when laying flat _12 Claudication _13 Pain in feet when walking _14 Pain in feet at rest _15 Pain in feet when laying flat _16 History of DVT _17 Phlebitis _18 Swelling in legs _19 Varicose veins _20 Non-healing ulcers Pulmonary: _21 Uses home oxygen _22 Productive cough_23 Hemoptysis _24 Wheeze _25 COPD _26 Asthma Neurologic: _27 Dizziness _28 Blackouts _29 Seizures _30 History of stroke _31 History of TIA_32 Aphasia _33 Temporary blindness_34 Dysphagia _35 Weaknessor numbness in arms _36 Weakness or numbnessin legs Musculoskeletal: _37 Arthritis _38 Joint swelling _39 Joint pain _40 Low back pain Hematologic:_41 Easy bruising_42 Easy bleeding  _43 Hypercoagulable state _44 Anemic  Gastrointestinal:_45 Blood in stool_46 Vomiting blood_47 Gastroesophageal reflux/heartburn_48 Abdominal pain Genitourinary: _49 Chronic kidney disease _50 Difficulturination _51 Frequenturination _52 Burning with urination_53 Hematuria Skin: _54 Rashes _55 Ulcers _56 Wounds Psychological: _57 History of anxiety_58 History of major depression.   Physical Examination  BP (!) 141/67 (BP Location: Left Arm)   Pulse (!) 107   Resp 14   Ht 5' (1.524 m)   Wt 68.9 kg (152 lb)   BMI 29.69 kg/m  Gen:  WD/WN, NAD Head: [place]/AT, No temporalis wasting. Ear/Nose/Throat: Hearing grossly intact, nares w/o erythema or drainage, trachea midline Eyes: Conjunctiva clear. Sclera non-icteric Neck: Supple.  No JVD.  Pulmonary:  Good air movement, no use of accessory muscles.  Cardiac: irregular Vascular:  Vessel Right Left  Radial Palpable Palpable                          PT  1+ palpable  not palpable  DP  not palpable  not palpable   Musculoskeletal: Uses a wheelchair.  Right hemiparesis.  2+ left lower extremity swelling with multiple nonhealing ulcerations on the left calf and measuring several centimeters in length and width. Neurologic: Sensation grossly intact in extremities.  Right hemiparesis. speech is fluent.  Psychiatric: Judgment intact, Mood & affect appropriate for pt's clinical situation. Dermatologic: Open wounds on the left leg as above      Labs Recent Results (from the past 2160 hour(s))  POCT HgB A1C     Status: None   Collection Time: 03/02/17 11:37 AM  Result Value Ref Range   Hemoglobin A1C 6.4     Radiology US Breast Limited Uni Left Inc Axilla  Result Date: 03/28/2017 CLINICAL DATA:  83 year old female presenting for annual mammogram. Patient has known left breast cancer, and is currently being treated with hormonal therapy. EXAM: 2D DIGITAL DIAGNOSTIC LEFT MAMMOGRAM WITH CAD AND ADJUNCT TOMO ULTRASOUND LEFT BREAST  COMPARISON:  Previous exam(s). ACR Breast Density Category c: The breast tissue is heterogeneously dense, which may obscure small masses. FINDINGS: An ill-defined mass/distortion is again noted in the inferior central left breast with associated post biopsy clip. This is consistent with the region of the patient's known invasive ductal carcinoma. Additional note is made of diffuse skin and trabecular thickening on the left. The appearance is progressed from comparison study. Mammographic images were processed with CAD. Targeted ultrasound is performed, showing soft tissue edema and skin thickening in the lower left breast. The area of known malignancy at the 6 o'clock position 2 cm from the nipple is increasing ill-defined,  making exact measurements difficult. However, it is measuring slightly larger from comparison study in 2016, currently 1.8 x 1.7 x 1.2 cm (previously 1.4 x 1.2 x 1.1 cm). Note is made of an internal echogenic focus likely representing a post biopsy clip as well as internal vascularity. IMPRESSION: 1. Slight interval enlargement of the patient's known left breast malignancy, currently measuring 1.8 x 1.7 x 1.2 cm (previously 1.4 x 1.2 x 1.1 cm in 2016). There is marked surrounding soft tissue edema and the mass appears ill-defined, making exact measurements/delineation difficult. 2. Interval worsening of left breast skin thickening and edema. Findings may represent a systemic process such as heart failure. However, inflammatory breast cancer is not excluded given the patient's history of invasive disease. Further evaluation with skin punch biopsy should be considered as clinically indicated. RECOMMENDATION: Continued clinical treatment plan. I have discussed the findings and recommendations with the patient. Results were also provided in writing at the conclusion of the visit. If applicable, a reminder letter will be sent to the patient regarding the next appointment. BI-RADS CATEGORY  6: Known  biopsy-proven malignancy. Electronically Signed   By: Kristopher Oppenheim M.D.   On: 03/28/2017 12:39   Mm Diag Breast Tomo Uni Left  Result Date: 03/28/2017 CLINICAL DATA:  83 year old female presenting for annual mammogram. Patient has known left breast cancer, and is currently being treated with hormonal therapy. EXAM: 2D DIGITAL DIAGNOSTIC LEFT MAMMOGRAM WITH CAD AND ADJUNCT TOMO ULTRASOUND LEFT BREAST COMPARISON:  Previous exam(s). ACR Breast Density Category c: The breast tissue is heterogeneously dense, which may obscure small masses. FINDINGS: An ill-defined mass/distortion is again noted in the inferior central left breast with associated post biopsy clip. This is consistent with the region of the patient's known invasive ductal carcinoma. Additional note is made of diffuse skin and trabecular thickening on the left. The appearance is progressed from comparison study. Mammographic images were processed with CAD. Targeted ultrasound is performed, showing soft tissue edema and skin thickening in the lower left breast. The area of known malignancy at the 6 o'clock position 2 cm from the nipple is increasing ill-defined, making exact measurements difficult. However, it is measuring slightly larger from comparison study in 2016, currently 1.8 x 1.7 x 1.2 cm (previously 1.4 x 1.2 x 1.1 cm). Note is made of an internal echogenic focus likely representing a post biopsy clip as well as internal vascularity. IMPRESSION: 1. Slight interval enlargement of the patient's known left breast malignancy, currently measuring 1.8 x 1.7 x 1.2 cm (previously 1.4 x 1.2 x 1.1 cm in 2016). There is marked surrounding soft tissue edema and the mass appears ill-defined, making exact measurements/delineation difficult. 2. Interval worsening of left breast skin thickening and edema. Findings may represent a systemic process such as heart failure. However, inflammatory breast cancer is not excluded given the patient's history of  invasive disease. Further evaluation with skin punch biopsy should be considered as clinically indicated. RECOMMENDATION: Continued clinical treatment plan. I have discussed the findings and recommendations with the patient. Results were also provided in writing at the conclusion of the visit. If applicable, a reminder letter will be sent to the patient regarding the next appointment. BI-RADS CATEGORY  6: Known biopsy-proven malignancy. Electronically Signed   By: Kristopher Oppenheim M.D.   On: 03/28/2017 12:39      Assessment/Plan DM (diabetes mellitus), type 2 with neurological complications (HCC) blood glucose control important in reducing the progression of atherosclerotic disease. Also, involved in wound healing. On appropriate medications.  On an appropriate diabetic diet to limit carbohydrates and help control her sugars.   Essential hypertension blood pressure control important in reducing the progression of atherosclerotic disease. On appropriate oral medications.  Stroke [facility]) With residual right-sided weakness. Pain on her right side may be related to neuropathy after her stroke as well.  Decubitus ulcer of sacral region, stage 2 Sees the wound care center  Atherosclerosis of native arteries of the extremities with ulceration ([place]) This is clearly a critical and limb threatening situation.  I had a very long talk today with the patient and multiple family members regarding the situation and the fact that she does not have exorbitant pain is encouraging and may allow [place] to continue with local wound care and avoid major amputation in the short-term.  I do not think these wounds are likely to heal given her poor perfusion, but a small percentage of wounds particularly more proximal on the leg will heal even with poor perfusion.  The wound care center has recommended debridement and the family says that they would prefer me to do this in the operating room which is reasonable.  I am happy to  do this.  This will be scheduled at their convenience in the near future.  I think the yield of an angiogram at this point is fairly low although I have told them that I would be happy to offer this rather than limb loss in the future if they would prefer.    Leotis Pain, MD  04/12/2017 12:13 PM    This note was created with Dragon medical transcription system.  Any errors from dictation are purely unintentional

## 2017-04-12 NOTE — Patient Instructions (Signed)

## 2017-04-14 ENCOUNTER — Encounter
Admission: RE | Admit: 2017-04-14 | Discharge: 2017-04-14 | Disposition: A | Discharge status: Home / Self Care | Payer: Medicare PPO | Source: Ambulatory Visit | Attending: Vascular Surgery | Admitting: Vascular Surgery

## 2017-04-14 ENCOUNTER — Telehealth: Payer: Self-pay

## 2017-04-14 DIAGNOSIS — Z01818 Encounter for other preprocedural examination: Secondary | ICD-10-CM | POA: Diagnosis present

## 2017-04-14 DIAGNOSIS — I10 Essential (primary) hypertension: Secondary | ICD-10-CM | POA: Diagnosis not present

## 2017-04-14 DIAGNOSIS — E119 Type 2 diabetes mellitus without complications: Secondary | ICD-10-CM | POA: Diagnosis not present

## 2017-04-14 DIAGNOSIS — R9431 Abnormal electrocardiogram [ECG] [EKG]: Secondary | ICD-10-CM | POA: Insufficient documentation

## 2017-04-14 LAB — CBC WITH DIFFERENTIAL/PLATELET
BASOS ABS: 0.1 10*3/uL (ref 0–0.1)
BASOS PCT: 1 %
Eosinophils Absolute: 0.1 10*3/uL (ref 0–0.7)
Eosinophils Relative: 1 %
HEMATOCRIT: 31.4 % — AB (ref 35.0–47.0)
HEMOGLOBIN: 10.4 g/dL — AB (ref 12.0–16.0)
LYMPHS ABS: 2.3 10*3/uL (ref 1.0–3.6)
Lymphocytes Relative: 21 %
MCH: 29.1 pg (ref 26.0–34.0)
MCHC: 33.1 g/dL (ref 32.0–36.0)
MCV: 87.8 fL (ref 80.0–100.0)
Monocytes Absolute: 0.6 10*3/uL (ref 0.2–0.9)
Monocytes Relative: 5 %
NEUTROS PCT: 72 %
Neutro Abs: 7.8 10*3/uL — ABNORMAL HIGH (ref 1.4–6.5)
Platelets: 707 10*3/uL — ABNORMAL HIGH (ref 150–440)
RBC: 3.58 MIL/uL — ABNORMAL LOW (ref 3.80–5.20)
RDW: 15.9 % — ABNORMAL HIGH (ref 11.5–14.5)
WBC: 10.9 10*3/uL (ref 3.6–11.0)

## 2017-04-14 LAB — BASIC METABOLIC PANEL
Anion gap: 7 (ref 5–15)
BUN: 16 mg/dL (ref 6–20)
CALCIUM: 8.3 mg/dL — AB (ref 8.9–10.3)
CHLORIDE: 111 mmol/L (ref 101–111)
CO2: 24 mmol/L (ref 22–32)
CREATININE: 0.72 mg/dL (ref 0.44–1.00)
Glucose, Bld: 169 mg/dL — ABNORMAL HIGH (ref 65–99)
Potassium: 3.6 mmol/L (ref 3.5–5.1)
SODIUM: 142 mmol/L (ref 135–145)

## 2017-04-14 LAB — TYPE AND SCREEN
ABO/RH(D): O POS
Antibody Screen: NEGATIVE

## 2017-04-14 LAB — PROTIME-INR
INR: 1.07
Prothrombin Time: 13.8 seconds (ref 11.4–15.2)

## 2017-04-14 LAB — APTT: APTT: 33 s (ref 24–36)

## 2017-04-14 NOTE — Telephone Encounter (Addendum)
Received medical clearance request, EKG for your  review from [facility]  , patient is to have surgery  on 04/21/17 non healing wound left lower leg.  Patient needs to be seen for medical clearance .  Former Dr Lacinda Axon patient last seen 03/02/17 Paperwork placed on top of your folder.

## 2017-04-14 NOTE — Pre-Procedure Instructions (Addendum)
Today's EKG results Sinus tachycardia with occasional PVC complexes, AT and T wave abnormality, consider lateral ischemia. Vent. Rate 113.  Notified Dr. Andree Elk regarding today's EKG, he is requesting a medical clearance, pt does not need to go to ED or her PCP today.  Lab tech for PAT and this RN were unable to draw pt's blood work.  Lab called and sent a lab tech to come draw pt's blood.

## 2017-04-14 NOTE — Pre-Procedure Instructions (Signed)
Spoke with [name] at [facility] regarding faxed to Medical clearance to their office. Fax has been received.  [name] will show the medical clearance along with EKG's to one of the nurses and will pass it along to one of the physicians.   Pt saw Dr. Lacinda Axon last month, Dr. Lacinda Axon is no longer working at this clinic.

## 2017-04-14 NOTE — Pre-Procedure Instructions (Signed)
Krisitin one of the nurses from [facility] called our office to get more information on the medical clearance faxed to their office earlier today.  Information provided.  EKG's today's labs and Medical clearance will be reviewed by one of the physicians tomorrow.

## 2017-04-14 NOTE — Patient Instructions (Addendum)
Your procedure is scheduled on: Thursday, Nov. 8, 2018. Report to Same Day Surgery. To find out your arrival time please call [phone] between 1PM - 3PM on Wednesday Nov. 7, 2018.Marland Kitchen  Remember: Instructions that are not followed completely may result in serious medical risk, up to and including death, or upon the discretion of your surgeon and anesthesiologist your surgery may need to be rescheduled.     _X__ 1. Do not eat food after midnight the night before your procedure.                 No gum chewing or hard candies. You may drink clear liquids up to 2 hours                 before you are scheduled to arrive for your surgery- DO not drink clear                 liquids within 2 hours of the start of your surgery.                 Clear Liquids include:  Water..     ___ 2.  No Alcohol for 24 hours before or after surgery.   ___ 3.  Do Not Smoke or use e-cigarettes For 24 Hours Prior to Your Surgery.                 Do not use any chewable tobacco products for at least 6 hours prior to                 surgery.  ____  4.  Bring all medications with you on the day of surgery if instructed.   __x__  5.  Notify your doctor if there is any change in your medical condition      (cold, fever, infections).     Do not wear jewelry, make-up, hairpins, clips or nail polish. Do not wear lotions, powders, or perfumes. You may wear deodorant. Do not shave 48 hours prior to surgery. Men may shave face and neck. Do not bring valuables to the hospital.    [facility] is not responsible for any belongings or valuables.  Contacts, dentures or bridgework may not be worn into surgery. Leave your suitcase in the car. After surgery it may be brought to your room. For patients admitted to the hospital, discharge time is determined by your treatment team.   Patients discharged the day of surgery will not be allowed to drive home.   Please read over the following fact sheets  that you were given:   Preparing the skin for surgery  _x___ Take these medicines the morning of surgery with A SIP OF WATER:    1. amLODipine (NORVASC)  2. hydrALAZINE (APRESOLINE)   3.ranitidine (ZANTAC)  take at bedtime the night prior to surgery and the am of surgery.  ____ Fleet Enema (as directed)   __x__ Use SAGE wipes as directed  ____ Use inhalers on the day of surgery  ____ Stop metformin 2 days prior to surgery    ____ Take 1/2 of usual insulin dose the night before surgery. No insulin the morning          of surgery.   _x__ Stop Plavix 3 days prior to surgery.   ____ Stop Anti-inflammatories on does not apply.   _x___ Stop supplements:CRANBERRY,  until after surgery.    ____ Bring C-Pap to the hospital.

## 2017-04-14 NOTE — Pre-Procedure Instructions (Signed)
Blood work was attained by lab.    Medical request faxed to both Dr. Bunnie Domino office and pt's PCP [facility].

## 2017-04-15 NOTE — Telephone Encounter (Addendum)
Dr Caryl Bis was wondering if you could see [name] on Wednesday November 6h.   He doesn't have any openings next week .  See below message.   I went ahead and placed patient on schedule. I will place pre-op paperwork in your paperwork for review . EKG has already been done.    If you any questions for  Dr Caryl Bis please  speak with him.  Thanks

## 2017-04-15 NOTE — Telephone Encounter (Addendum)
Patient needs in an office visit for clearance. EKGs looked relatively similar.

## 2017-04-16 NOTE — Progress Notes (Deleted)
Subjective:    Patient ID: [name], female    DOB: 08-05-1921, 83 y.o.   MRN: [id number]  CC: [name] is a 83 y.o. female who presents today for follow up.   HPI: Here for pre op clearance for wound debridement  EKG done prior  H/o DM, CVA, CKD, HTN  Labs from 11/1- crt 0.72. Calcium decreased Anemic  Atherosclerosis- dr dew No cardiologist On ASA, plavix   HISTORY:  Past Medical History:  Diagnosis Date  . Anxiety state 01/01/2015  . Arthritis    "all over"  . Breast cancer ([place])    Hx of R breast cancer s/p mastectomy; Currently has L breast cancer (Diagnosed 2015).   . Breast cancer, left breast ([place]) 01/01/2015  . Decubitus ulcer of sacral region, stage 2 01/01/2015  . Decubitus ulcer, buttock   . Diabetes mellitus with insulin therapy ([place]) 01/01/2015  . Essential hypertension 01/01/2015  . Glaucoma 01/01/2015  . Glaucoma, bilateral   . History of breast cancer 01/01/2015  . History of DVT (deep vein thrombosis) 1980's   BLE  . HLD (hyperlipidemia) 01/01/2015  . Hyperlipidemia   . Hypertension   . Itchy scalp 02/10/2015  . Osteoarthritis 01/01/2015  . Recurrent UTI 03/06/2015  . Right knee pain 02/10/2015  . TIA (transient ischemic attack) 02/18/2015  . Type II diabetes mellitus ([place])    diet controlled   Past Surgical History:  Procedure Laterality Date  . ABDOMINAL HYSTERECTOMY    . BREAST BIOPSY Right 1980's   +  . BREAST EXCISIONAL BIOPSY Left 2015   + stereo no surgery  . CHOLECYSTECTOMY    . LOWER EXTREMITY ANGIOGRAPHY Left 09/30/2016   Procedure: Lower Extremity Angiography;  Surgeon: Algernon Huxley, MD;  Location: [facility];  Service: Cardiovascular;  Laterality: Left;  . LOWER EXTREMITY INTERVENTION  09/30/2016   Procedure: Lower Extremity Intervention;  Surgeon: Algernon Huxley, MD;  Location: [facility];  Service: Cardiovascular;;  . MASTECTOMY Right 1980's  . MEDIAL PARTIAL KNEE REPLACEMENT Right 1980's?  . PERIPHERAL VASCULAR  CATHETERIZATION  04/28/2015   Procedure: Lower Extremity Intervention;  Surgeon: Algernon Huxley, MD;  Location: [facility];  Service: Cardiovascular;;  . PERIPHERAL VASCULAR CATHETERIZATION N/A 04/28/2015   Procedure: Abdominal Aortogram w/Lower Extremity;  Surgeon: Algernon Huxley, MD;  Location: [facility];  Service: Cardiovascular;  Laterality: N/A;  . PERIPHERAL VASCULAR CATHETERIZATION Left 10/20/2015   Procedure: Lower Extremity Angiography;  Surgeon: Algernon Huxley, MD;  Location: [facility];  Service: Cardiovascular;  Laterality: Left;  . PERIPHERAL VASCULAR CATHETERIZATION  10/20/2015   Procedure: Lower Extremity Intervention;  Surgeon: Algernon Huxley, MD;  Location: [facility];  Service: Cardiovascular;;   Family History  Problem Relation Age of Onset  . Heart disease Mother   . Diabetes Son   . Diabetes Son   . Kidney disease Son   . Kidney cancer Neg Hx   . Bladder Cancer Neg Hx   . Prostate cancer Neg Hx     Allergies: Patient has no known allergies. Current Outpatient Prescriptions on File Prior to Visit  Medication Sig Dispense Refill  . Alcohol Swabs (B-D SINGLE USE SWABS REGULAR) PADS Use as directed. 100 each 11  . amLODipine (NORVASC) 5 MG tablet TAKE 1 TABLET EVERY DAY (Patient taking differently: TAKE 5m EVERY DAY) 90 tablet 2  . aspirin 81 MG EC tablet Take 81 mg by mouth daily at  12 noon. Takes at noon    . B-D UF III MINI PEN NEEDLES 31G X 5 MM MISC Use when checking blood sugar. 100 each 11  . blood glucose meter kit and supplies Dispense a Accu Check meter..  E11.29 1 each 0  . brimonidine (ALPHAGAN P) 0.1 % SOLN PLACE 1 DROP INTO BOTH EYES 2 (TWO) TIMES DAILY. (Patient not taking: Reported on 04/12/2017) 5 mL 1  . Calcium Carb-Cholecalciferol (CALCIUM 600 + D PO) Take 1 tablet by mouth daily.     . Calcium Polycarbophil (FIBERCON PO) Take 1 tablet by mouth every other day.    . Cholecalciferol (VITAMIN D3) 1000 UNITS CAPS Take 1,000  Units by mouth daily.     . clopidogrel (PLAVIX) 75 MG tablet TAKE 1 TABLET EVERY DAY 90 tablet 2  . CRANBERRY PO Take 1 capsule by mouth daily.     Marland Kitchen DIAPER RASH PRODUCTS EX Apply 1 application topically daily as needed (sores, diaper rash).     Marland Kitchen exemestane (AROMASIN) 25 MG tablet TAKE 1 TABLET EVERY DAY AT BEDTIME (Patient taking differently: TAKE 36m  EVERY DAY AT BEDTIME) 90 tablet 6  . Fluocinolone Acetonide 0.01 % SHAM Apply to scalp once daily; work into lCarMaxand allow to remain on scalp for ~5 minutes then rinse with water. (Patient taking differently: Apply 1 application topically every other day. Apply to scalp every other day; work into lCarMaxand allow to remain on scalp for ~5 minutes then rinse with water.) 120 mL 1  . glucose blood (COOL BLOOD GLUCOSE TEST STRIPS) test strip Use as instructed 100 each 12  . hydrALAZINE (APRESOLINE) 50 MG tablet TAKE 1 TABLET FOUR TIMES DAILY (Patient taking differently: TAKE 541mFOUR TIMES DAILY) 360 tablet 2  . ketoconazole (NIZORAL) 2 % shampoo Apply 1 application topically every other day.     . Lancets (ACCU-CHEK SOFT TOUCH) lancets Use as instructed 100 each 12  . LUMIGAN 0.01 % SOLN PLACE 1 DROP INTO BOTH EYES AT BEDTIME. 3 Bottle 0  . magnesium hydroxide (MILK OF MAGNESIA) 400 MG/5ML suspension Take 30 mLs by mouth daily as needed for constipation.    . Multiple Vitamins-Minerals (MULTIVITAMIN GUMMIES ADULT PO) Take 1 tablet by mouth daily.     . Marland Kitchenmeprazole (PRILOSEC) 20 MG capsule TAKE 1 CAPSULE EVERY DAY (Patient taking differently: TAKE 202mVERY DAY) 90 capsule 2  . ranitidine (ZANTAC) 150 MG capsule TAKE 1 CAPSULE TWICE DAILY (Patient taking differently: TAKE 150m41mICE DAILY) 180 capsule 2  . SANTYL ointment Apply 1 application topically daily. Apply to left shin wound with each dressing change.  0  . SIMBRINZA 1-0.2 % SUSP Place 1 drop into both eyes 2 (two) times daily.     . traMADol (ULTRAM) 50 MG tablet Take 1-2 tablets  (50-100 mg total) by mouth every 8 (eight) hours as needed. (Patient taking differently: Take 50-100 mg by mouth every 8 (eight) hours as needed for moderate pain. ) 90 tablet 1  . zinc gluconate 50 MG tablet Take 50 mg by mouth daily.     No current facility-administered medications on file prior to visit.     Social History  Substance Use Topics  . Smoking status: Never Smoker  . Smokeless tobacco: Never Used  . Alcohol use No    Review of Systems    Objective:    There were no vitals taken for this visit. BP Readings from Last 3 Encounters:  04/14/17 136/66  04/12/17 (!)Marland Kitchen  141/67  03/02/17 109/62   Wt Readings from Last 3 Encounters:  04/14/17 152 lb (68.9 kg)  04/12/17 152 lb (68.9 kg)  02/15/17 152 lb (68.9 kg)    Physical Exam     Assessment & Plan:   Problem List Items Addressed This Visit    None       I am having Ms. Mangino maintain her Vitamin D3, CRANBERRY PO, Multiple Vitamins-Minerals (MULTIVITAMIN GUMMIES ADULT PO), Calcium Carb-Cholecalciferol (CALCIUM 600 + D PO), brimonidine, blood glucose meter kit and supplies, B-D UF III MINI PEN NEEDLES, exemestane, ketoconazole, B-D SINGLE USE SWABS REGULAR, accu-chek soft touch, glucose blood, aspirin, hydrALAZINE, clopidogrel, omeprazole, amLODipine, ranitidine, LUMIGAN, DIAPER RASH PRODUCTS EX, SIMBRINZA, Fluocinolone Acetonide, traMADol, zinc gluconate, Calcium Polycarbophil (FIBERCON PO), magnesium hydroxide, and SANTYL.   No orders of the defined types were placed in this encounter.   Return precautions given.   Risks, benefits, and alternatives of the medications and treatment plan prescribed today were discussed, and patient expressed understanding.   Education regarding symptom management and diagnosis given to patient on AVS.  Continue to follow with Coral Spikes, DO for routine health maintenance.   [name] and I agreed with plan.   Mable Paris, FNP

## 2017-04-16 NOTE — Telephone Encounter (Signed)
Will see patient

## 2017-04-18 ENCOUNTER — Ambulatory Visit: Payer: Self-pay | Admitting: Surgery

## 2017-04-19 ENCOUNTER — Ambulatory Visit: Payer: Self-pay | Admitting: Family

## 2017-04-19 DIAGNOSIS — Z0289 Encounter for other administrative examinations: Secondary | ICD-10-CM

## 2017-04-20 ENCOUNTER — Telehealth: Payer: Self-pay | Admitting: Family Medicine

## 2017-04-20 MED ORDER — CEFAZOLIN SODIUM-DEXTROSE 2-4 GM/100ML-% IV SOLN
2.0000 g | INTRAVENOUS | Status: AC
  Administered 2017-04-21: 2 g via INTRAVENOUS

## 2017-04-20 NOTE — Telephone Encounter (Signed)
Patient daughter was made aware anew appointment scheduled tomorrow for surgical clearance.with DR. Tullo. Paper need to be faxed to ASAP after appointment.

## 2017-04-21 ENCOUNTER — Encounter
Admission: RE | Disposition: A | Discharge status: Home / Self Care | Payer: Self-pay | Source: Ambulatory Visit | Attending: Vascular Surgery

## 2017-04-21 ENCOUNTER — Other Ambulatory Visit: Payer: Self-pay

## 2017-04-21 ENCOUNTER — Ambulatory Visit: Payer: Medicare PPO | Admitting: Anesthesiology

## 2017-04-21 ENCOUNTER — Ambulatory Visit (INDEPENDENT_AMBULATORY_CARE_PROVIDER_SITE_OTHER): Payer: Medicare PPO | Admitting: Internal Medicine

## 2017-04-21 ENCOUNTER — Encounter: Payer: Self-pay | Admitting: *Deleted

## 2017-04-21 ENCOUNTER — Ambulatory Visit
Admission: RE | Admit: 2017-04-21 | Discharge: 2017-04-21 | Disposition: A | Discharge status: Home / Self Care | Payer: Medicare PPO | Source: Ambulatory Visit | Attending: Vascular Surgery | Admitting: Vascular Surgery

## 2017-04-21 DIAGNOSIS — Z7902 Long term (current) use of antithrombotics/antiplatelets: Secondary | ICD-10-CM | POA: Insufficient documentation

## 2017-04-21 DIAGNOSIS — F419 Anxiety disorder, unspecified: Secondary | ICD-10-CM | POA: Diagnosis not present

## 2017-04-21 DIAGNOSIS — Z79899 Other long term (current) drug therapy: Secondary | ICD-10-CM | POA: Diagnosis not present

## 2017-04-21 DIAGNOSIS — I96 Gangrene, not elsewhere classified: Secondary | ICD-10-CM | POA: Insufficient documentation

## 2017-04-21 DIAGNOSIS — I10 Essential (primary) hypertension: Secondary | ICD-10-CM | POA: Insufficient documentation

## 2017-04-21 DIAGNOSIS — E1152 Type 2 diabetes mellitus with diabetic peripheral angiopathy with gangrene: Secondary | ICD-10-CM | POA: Diagnosis not present

## 2017-04-21 DIAGNOSIS — K219 Gastro-esophageal reflux disease without esophagitis: Secondary | ICD-10-CM | POA: Insufficient documentation

## 2017-04-21 DIAGNOSIS — I69351 Hemiplegia and hemiparesis following cerebral infarction affecting right dominant side: Secondary | ICD-10-CM | POA: Diagnosis not present

## 2017-04-21 DIAGNOSIS — Z7982 Long term (current) use of aspirin: Secondary | ICD-10-CM | POA: Diagnosis not present

## 2017-04-21 DIAGNOSIS — I493 Ventricular premature depolarization: Secondary | ICD-10-CM | POA: Diagnosis not present

## 2017-04-21 DIAGNOSIS — R000 Tachycardia, unspecified: Secondary | ICD-10-CM | POA: Diagnosis not present

## 2017-04-21 DIAGNOSIS — I252 Old myocardial infarction: Secondary | ICD-10-CM | POA: Diagnosis not present

## 2017-04-21 DIAGNOSIS — Z993 Dependence on wheelchair: Secondary | ICD-10-CM | POA: Insufficient documentation

## 2017-04-21 DIAGNOSIS — Z01818 Encounter for other preprocedural examination: Secondary | ICD-10-CM

## 2017-04-21 DIAGNOSIS — I2510 Atherosclerotic heart disease of native coronary artery without angina pectoris: Secondary | ICD-10-CM | POA: Insufficient documentation

## 2017-04-21 DIAGNOSIS — Z7984 Long term (current) use of oral hypoglycemic drugs: Secondary | ICD-10-CM | POA: Insufficient documentation

## 2017-04-21 HISTORY — PX: WOUND DEBRIDEMENT: SHX247

## 2017-04-21 HISTORY — PX: APPLICATION OF WOUND VAC: SHX5189

## 2017-04-21 LAB — ABO/RH: ABO/RH(D): O POS

## 2017-04-21 LAB — GLUCOSE, CAPILLARY: Glucose-Capillary: 147 mg/dL — ABNORMAL HIGH (ref 65–99)

## 2017-04-21 SURGERY — DEBRIDEMENT, WOUND
Anesthesia: General | Laterality: Left

## 2017-04-21 MED ORDER — CHLORHEXIDINE GLUCONATE CLOTH 2 % EX PADS: 6.0000 | MEDICATED_PAD | Freq: Once | CUTANEOUS | Status: DC

## 2017-04-21 MED ORDER — SODIUM CHLORIDE 0.9 % IV SOLN
INTRAVENOUS | Status: DC
  Administered 2017-04-21: 14:00:00 via INTRAVENOUS

## 2017-04-21 MED ORDER — FENTANYL CITRATE (PF) 100 MCG/2ML IJ SOLN: 25.0000 ug | INTRAMUSCULAR | Status: DC | PRN

## 2017-04-21 MED ORDER — FENTANYL CITRATE (PF) 100 MCG/2ML IJ SOLN
INTRAMUSCULAR | Status: DC | PRN
  Administered 2017-04-21 (×2): 25 ug via INTRAVENOUS

## 2017-04-21 MED ORDER — ONDANSETRON HCL 4 MG/2ML IJ SOLN: 4.0000 mg | Freq: Once | INTRAMUSCULAR | Status: DC | PRN

## 2017-04-21 MED ORDER — ONDANSETRON HCL 4 MG/2ML IJ SOLN
INTRAMUSCULAR | Status: AC
  Filled 2017-04-21: qty 2

## 2017-04-21 MED ORDER — METOPROLOL TARTRATE 25 MG PO TABS
25.0000 mg | ORAL_TABLET | Freq: Once | ORAL | Status: AC
  Administered 2017-04-21: 25 mg via ORAL

## 2017-04-21 MED ORDER — PROPOFOL 10 MG/ML IV BOLUS
INTRAVENOUS | Status: DC | PRN
  Administered 2017-04-21: 20 mg via INTRAVENOUS
  Administered 2017-04-21: 40 mg via INTRAVENOUS

## 2017-04-21 MED ORDER — FENTANYL CITRATE (PF) 100 MCG/2ML IJ SOLN
INTRAMUSCULAR | Status: AC
  Filled 2017-04-21: qty 2

## 2017-04-21 MED ORDER — ONDANSETRON HCL 4 MG/2ML IJ SOLN
INTRAMUSCULAR | Status: DC | PRN
  Administered 2017-04-21: 4 mg via INTRAVENOUS

## 2017-04-21 MED ORDER — LIDOCAINE HCL (CARDIAC) 20 MG/ML IV SOLN
INTRAVENOUS | Status: DC | PRN
  Administered 2017-04-21: 50 mg via INTRAVENOUS

## 2017-04-21 MED ORDER — CHLORHEXIDINE GLUCONATE CLOTH 2 % EX PADS
6.0000 | MEDICATED_PAD | Freq: Once | CUTANEOUS | Status: AC
  Administered 2017-04-21: 6 via TOPICAL

## 2017-04-21 MED ORDER — METOPROLOL TARTRATE 25 MG PO TABS
ORAL_TABLET | ORAL | Status: AC
  Filled 2017-04-21: qty 1

## 2017-04-21 MED ORDER — LIDOCAINE HCL (PF) 2 % IJ SOLN
INTRAMUSCULAR | Status: AC
  Filled 2017-04-21: qty 10

## 2017-04-21 MED ORDER — PROPOFOL 10 MG/ML IV BOLUS
INTRAVENOUS | Status: AC
  Filled 2017-04-21: qty 20

## 2017-04-21 MED ORDER — CEFAZOLIN SODIUM-DEXTROSE 2-4 GM/100ML-% IV SOLN
INTRAVENOUS | Status: AC
  Filled 2017-04-21: qty 100

## 2017-04-21 SURGICAL SUPPLY — 16 items
CANISTER SUCT 1200ML W/VALVE (MISCELLANEOUS) ×4 IMPLANT
DRAPE EXTREMITY 106X87X128.5 (DRAPES) ×4 IMPLANT
ELECT REM PT RETURN 9FT ADLT (ELECTROSURGICAL) ×4
ELECTRODE REM PT RTRN 9FT ADLT (ELECTROSURGICAL) ×2 IMPLANT
GLOVE BIO SURGEON STRL SZ7 (GLOVE) ×4 IMPLANT
GOWN STRL REUS W/ TWL LRG LVL3 (GOWN DISPOSABLE) ×2 IMPLANT
GOWN STRL REUS W/ TWL XL LVL3 (GOWN DISPOSABLE) ×2 IMPLANT
GOWN STRL REUS W/TWL LRG LVL3 (GOWN DISPOSABLE) ×2
GOWN STRL REUS W/TWL XL LVL3 (GOWN DISPOSABLE) ×2
KIT RM TURNOVER STRD PROC AR (KITS) ×4 IMPLANT
NS IRRIG 1000ML POUR BTL (IV SOLUTION) ×4 IMPLANT
PACK BASIN MINOR ARMC (MISCELLANEOUS) ×4 IMPLANT
SOL PREP PVP 2OZ (MISCELLANEOUS) ×4
SOLUTION PREP PVP 2OZ (MISCELLANEOUS) ×2 IMPLANT
SPONGE LAP 18X18 5 PK (GAUZE/BANDAGES/DRESSINGS) ×4 IMPLANT
STOCKINETTE STRL 4IN 9604848 (GAUZE/BANDAGES/DRESSINGS) ×4 IMPLANT

## 2017-04-21 NOTE — Op Note (Addendum)
    OPERATIVE NOTE   PROCEDURE: 1. Irrigation and debridement of 3 left leg wounds including skin, soft tissue, and muscle encompassing approximately 70 cm combining all 3 wounds with negative pressure dressing placement  PRE-OPERATIVE DIAGNOSIS: Nonviable tissue and tissue necrosis of left medial ankle, medial calf, and lateral calf wounds  POST-OPERATIVE DIAGNOSIS: Same as above  SURGEON: Leotis Pain, MD  ASSISTANT(S): None  ANESTHESIA: MAC  ESTIMATED BLOOD LOSS: 15 cc  FINDING(S): None  SPECIMEN(S):  none  INDICATIONS:   [name] is a 83 y.o. female who presents with a nonhealing ulcerations of the left lower extremity.  There was black eschar with clear devitalized tissue in the wound bed and all 3 wounds.  Surgical debridement and negative pressure dressing placement is planned and she is agreeable to proceed with surgery.  DESCRIPTION: After obtaining full informed written consent, the patient was brought back to the operating room and placed supine upon the operating table.  The patient received IV antibiotics prior to induction.  After obtaining adequate anesthesia, the patient was prepped and draped in the standard fashion.  The lateral calf wound was then opened and excisional debridement was performed to the skin, soft tissue, and muscle to remove all clearly non-viable tissue.  The dimensions of the lateral calf wound were about 9-10 cm in its longest length, 4-5 cm in its widest width, and about 0.5-1 cm in depth.  The tissue was taken back to bleeding tissue that appeared viable.  The debridement was performed with scalpel and Metzenbaum scissors and encompassed an area of approximately 35 cm2.  The medial calf wound was then addressed.  This wound measured approximately 6-7 cm in its longest length, 4-4-1/2 cm in its widest width, and 1-1.25 cm in its maximal depth.  This was debrided with scalpel and Metzenbaum scissors measuring an area of approximately 25 cm.  The  debridement involved skin, soft tissue, and muscle as well.  This was taken back to bleeding and potentially viable tissue.  The medial ankle ulceration was then debrided with scalpel and Metzenbaum scissors.  This was a 3-4 cm in diameter circular ulceration so its area was approximately 10 cm.  This was taken down to just above the bone including skin, soft tissue, and tendons of muscle.  After all clearly non-viable tissue was removed, a negative pressure dressing was placed using strips of Mepitel as bridges to be able to include all 3 wounds with a single suction and sponge.  Strips of Mepitel were used and a good occlusive seal was obtained. The patient was then awakened from anesthesia and taken to the recovery room in stable condition having tolerated the procedure well.  COMPLICATIONS: none  CONDITION: stable  Leotis Pain  04/21/2017, 3:05 PM   This note was created with Dragon Medical transcription system. Any errors in dictation are purely unintentional.

## 2017-04-21 NOTE — Anesthesia Preprocedure Evaluation (Signed)
Anesthesia Evaluation  Patient identified by MRN, date of birth, ID band Patient awake    Reviewed: Allergy & Precautions, NPO status , Patient's Chart, lab work & pertinent test results  History of Anesthesia Complications Negative for: history of anesthetic complications  Airway Mallampati: II       Dental   Pulmonary neg sleep apnea, neg COPD,           Cardiovascular hypertension, Pt. on medications + CAD, + Past MI and + Peripheral Vascular Disease  (-) CHF + dysrhythmias (tachycardia) (-) Valvular Problems/Murmurs     Neuro/Psych Anxiety TIACVA (r sided weakness), Residual Symptoms    GI/Hepatic Neg liver ROS, GERD  Medicated,  Endo/Other  diabetes, Type 2, Oral Hypoglycemic Agents  Renal/GU Renal InsufficiencyRenal disease     Musculoskeletal   Abdominal   Peds  Hematology   Anesthesia Other Findings   Reproductive/Obstetrics                            Anesthesia Physical Anesthesia Plan  ASA: III  Anesthesia Plan: General   Post-op Pain Management:    Induction: Intravenous  PONV Risk Score and Plan:   Airway Management Planned:   Additional Equipment:   Intra-op Plan:   Post-operative Plan:   Informed Consent: I have reviewed the patients History and Physical, chart, labs and discussed the procedure including the risks, benefits and alternatives for the proposed anesthesia with the patient or authorized representative who has indicated his/her understanding and acceptance.     Plan Discussed with:   Anesthesia Plan Comments:         Anesthesia Quick Evaluation

## 2017-04-21 NOTE — Assessment & Plan Note (Addendum)
Subacute by review of records.  No EKG changes other than rate.  She is not tachypneic, hypoxic, or dyspneic.  Denies chest pain .

## 2017-04-21 NOTE — Progress Notes (Signed)
Subjective:  Patient ID: [name], female    DOB: 1922/05/16  Age: 83 y.o. MRN: [id number]  CC: Diagnoses of Sinus tachycardia by electrocardiogram and Preoperative evaluation to rule out surgical contraindication were pertinent to this visit.  HPI [name] presents for preoperative clearance.  Patient Is scheduled to have excisional debridement  Of left leg This afternoon,  Was worked in urgently for clearance due to office miscommunication with patient   Lab Results  Component Value Date   HGBA1C 6.4 03/02/2017   Normal cr by labs done Nov 1 EKG  Nov  1 sinus tach with PVCs read by me.  Still tachycardic today  .  Deines chest pain and shortness of breath. 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 Alcohol Swabs (B-D SINGLE USE SWABS REGULAR) PADS Use as directed. 100 each 11  . amLODipine (NORVASC) 5 MG tablet TAKE 1 TABLET EVERY DAY (Patient taking differently: TAKE 11m EVERY DAY) 90 tablet 2  . aspirin 81 MG EC tablet Take 81 mg by mouth daily at 12 noon. Takes at noon    . B-D UF III MINI PEN NEEDLES 31G X 5 MM MISC Use when checking blood sugar. 100 each 11  . blood glucose meter kit and supplies Dispense a Accu Check meter..  E11.29 1 each 0  . brimonidine (ALPHAGAN P) 0.1 % SOLN PLACE 1 DROP INTO BOTH EYES 2 (TWO) TIMES DAILY. 5 mL 1  . Calcium Carb-Cholecalciferol (CALCIUM 600 + D PO) Take 1 tablet by mouth daily.     . Calcium Polycarbophil (FIBERCON PO) Take 1 tablet by mouth every other day.    . Cholecalciferol (VITAMIN D3) 1000 UNITS CAPS Take 1,000 Units by mouth daily.     . clopidogrel (PLAVIX) 75 MG tablet TAKE 1 TABLET EVERY DAY 90 tablet 2  . CRANBERRY PO Take 1 capsule by mouth daily.     .Marland KitchenDIAPER RASH  PRODUCTS EX Apply 1 application topically daily as needed (sores, diaper rash).     .Marland Kitchenexemestane (AROMASIN) 25 MG tablet TAKE 1 TABLET EVERY DAY AT BEDTIME (Patient taking differently: TAKE 234m EVERY DAY AT BEDTIME) 90 tablet 6  . Fluocinolone Acetonide 0.01 % SHAM Apply to scalp once daily; work into laCarMaxnd allow to remain on scalp for ~5 minutes then rinse with water. (Patient taking differently: Apply 1 application topically every other day. Apply to scalp every other day; work into laCarMaxnd allow to remain on scalp for ~5 minutes then rinse with water.) 120 mL 1  . glucose blood (COOL BLOOD GLUCOSE TEST STRIPS) test strip Use as instructed 100 each 12  . hydrALAZINE (APRESOLINE) 50 MG tablet TAKE 1 TABLET FOUR TIMES DAILY (Patient taking differently: TAKE 5039mOUR TIMES DAILY) 360 tablet 2  . ketoconazole (NIZORAL) 2 % shampoo Apply 1 application topically every other day.     . Lancets (ACCU-CHEK SOFT TOUCH) lancets Use as instructed 100 each 12  . LUMIGAN 0.01 % SOLN PLACE 1 DROP INTO BOTH EYES AT BEDTIME. 3 Bottle 0  . magnesium hydroxide (MILK OF MAGNESIA) 400 MG/5ML suspension Take 30 mLs by mouth daily as needed for constipation.    .Marland Kitchen  Multiple Vitamins-Minerals (MULTIVITAMIN GUMMIES ADULT PO) Take 1 tablet by mouth daily.     Marland Kitchen omeprazole (PRILOSEC) 20 MG capsule TAKE 1 CAPSULE EVERY DAY (Patient not taking: Reported on 04/21/2017) 90 capsule 2  . ranitidine (ZANTAC) 150 MG capsule TAKE 1 CAPSULE TWICE DAILY (Patient taking differently: TAKE 168m TWICE DAILY) 180 capsule 2  . SANTYL ointment Apply 1 application topically daily. Apply to left shin wound with each dressing change.  0  . SIMBRINZA 1-0.2 % SUSP Place 1 drop into both eyes 2 (two) times daily.     . traMADol (ULTRAM) 50 MG tablet Take 1-2 tablets (50-100 mg total) by mouth every 8 (eight) hours as needed. (Patient taking differently: Take 50-100 mg by mouth every 8 (eight) hours as needed for moderate pain. ) 90 tablet 1    . zinc gluconate 50 MG tablet Take 50 mg by mouth daily.    .Marland KitchenceFAZolin (ANCEF) IVPB 2g/100 mL premix      No facility-administered medications prior to visit.     Review of Systems;  Patient denies headache, fevers, malaise, unintentional weight loss, skin rash, eye pain, sinus congestion and sinus pain, sore throat, dysphagia,  hemoptysis , cough, dyspnea, wheezing, chest pain, palpitations, orthopnea, edema, abdominal pain, nausea, melena, diarrhea, constipation, flank pain, dysuria, hematuria, urinary  Frequency, nocturia, numbness, tingling, seizures,  Focal weakness, Loss of consciousness,  Tremor, insomnia, depression, anxiety, and suicidal ideation.      Objective:  BP (!) 146/76 (BP Location: Left Arm, Patient Position: Sitting, Cuff Size: Normal)   Pulse (!) 116   Temp (!) 97.5 F (36.4 C) (Oral)   Resp 14   SpO2 100%   BP Readings from Last 3 Encounters:  04/21/17 (!) 154/54  04/21/17 (!) 146/76  04/14/17 136/66    Wt Readings from Last 3 Encounters:  04/21/17 152 lb (68.9 kg)  04/14/17 152 lb (68.9 kg)  04/12/17 152 lb (68.9 kg)    General appearance: alert, cooperative and appears stated age Ears: normal TM's and external ear canals both ears Throat: lips, mucosa, and tongue normal; teeth and gums normal Neck: no adenopathy, no carotid bruit, supple, symmetrical, trachea midline and thyroid not enlarged, symmetric, no tenderness/mass/nodules Back: symmetric, no curvature. ROM normal. No CVA tenderness. Lungs: clear to auscultation bilaterally Heart: regular rate and rhythm, S1, S2 normal, no murmur, click, rub or gallop Abdomen: soft, non-tender; bowel sounds normal; no masses,  no organomegaly Pulses: 2+ and symmetric Skin: Skin color, texture, turgor normal. No rashes or lesions Lymph nodes: Cervical, supraclavicular, and axillary nodes normal.  Lab Results  Component Value Date   HGBA1C 6.4 03/02/2017   HGBA1C 6.0 09/07/2016   HGBA1C 7.1 (H)  09/01/2015    Lab Results  Component Value Date   CREATININE 0.72 04/14/2017   CREATININE 0.67 09/29/2016   CREATININE 0.60 01/26/2016    Lab Results  Component Value Date   WBC 10.9 04/14/2017   HGB 10.4 (L) 04/14/2017   HCT 31.4 (L) 04/14/2017   PLT 707 (H) 04/14/2017   GLUCOSE 169 (H) 04/14/2017   CHOL 84 02/19/2015   TRIG 88 02/19/2015   HDL 27 (L) 02/19/2015   LDLCALC 39 02/19/2015   ALT 13 (L) 01/26/2016   AST 21 01/26/2016   NA 142 04/14/2017   K 3.6 04/14/2017   CL 111 04/14/2017   CREATININE 0.72 04/14/2017   BUN 16 04/14/2017   CO2 24 04/14/2017   TSH 2.04 09/01/2015   INR 1.07 04/14/2017  HGBA1C 6.4 03/02/2017    No results found.  Assessment & Plan:   Problem List Items Addressed This Visit    Preoperative evaluation to rule out surgical contraindication    Patient  is considered to be at low risk  For perioperative complications related to today's planned debridement under conscious sedation.  Based on today's exam and history.  Baseline lytes,  hgb and ekg done.  Lab Results  Component Value Date   NA 142 04/14/2017   K 3.6 04/14/2017   CL 111 04/14/2017   CO2 24 04/14/2017   Lab Results  Component Value Date   CREATININE 0.72 04/14/2017         Sinus tachycardia by electrocardiogram    Subacute by review of records.  No EKG changes other than rate.  She is not tachypneic, hypoxic, or dyspneic.  Denies chest pain .           I am having [name] maintain her Vitamin D3, CRANBERRY PO, Multiple Vitamins-Minerals (MULTIVITAMIN GUMMIES ADULT PO), Calcium Carb-Cholecalciferol (CALCIUM 600 + D PO), brimonidine, blood glucose meter kit and supplies, B-D UF III MINI PEN NEEDLES, exemestane, ketoconazole, B-D SINGLE USE SWABS REGULAR, accu-chek soft touch, glucose blood, aspirin, hydrALAZINE, clopidogrel, omeprazole, amLODipine, ranitidine, LUMIGAN, DIAPER RASH PRODUCTS EX, SIMBRINZA, Fluocinolone Acetonide, traMADol, zinc gluconate, Calcium  Polycarbophil (FIBERCON PO), magnesium hydroxide, and SANTYL.  No orders of the defined types were placed in this encounter.   There are no discontinued medications.  Follow-up: No Follow-up on file.   Crecencio Mc, MD

## 2017-04-21 NOTE — H&P (Signed)
[facility] History & Physical Update  The patient was interviewed and re-examined.  The patient's previous History and Physical has been reviewed and is unchanged.  There is no change in the plan of care. We plan to proceed with the scheduled procedure.  Leotis Pain, MD  04/21/2017, 1:21 PM

## 2017-04-21 NOTE — OR Nursing (Signed)
Bloody drng noted on pt's footie when getting her into car.  Called Dr. Lucky Cowboy re same, advised ok to apply abd pads/gauze/paper tape to drainage site.  Done as instructed by Lorie Apley, RN

## 2017-04-21 NOTE — Discharge Instructions (Signed)

## 2017-04-21 NOTE — Transfer of Care (Signed)
Immediate Anesthesia Transfer of Care Note  Patient: Cynde Menard  Procedure(s) Performed: DEBRIDEMENT WOUND (Left ) APPLICATION OF WOUND VAC (Left ) ULTRASOUND GUIDED MICROPUNCTURE VENOUS ACCESS  Patient Location: PACU  Anesthesia Type:General  Level of Consciousness: awake and patient cooperative  Airway & Oxygen Therapy: Patient Spontanous Breathing and Patient connected to face mask oxygen  Post-op Assessment: Report given to RN and Post -op Vital signs reviewed and stable  Post vital signs: Reviewed and stable  Last Vitals:  Vitals:   04/21/17 1229 04/21/17 1515  BP: (!) 164/67   Pulse: (!) 123   Resp: 14   Temp: (!) 36.4 C (P) 36.4 C  SpO2: 100%     Last Pain:  Vitals:   04/21/17 1229  TempSrc: Oral         Complications: No apparent anesthesia complications

## 2017-04-21 NOTE — Anesthesia Post-op Follow-up Note (Signed)
Anesthesia [facility] form completed.        

## 2017-04-21 NOTE — OR Nursing (Signed)
Avance negative pressure wound therapy used from supplier.

## 2017-04-21 NOTE — Anesthesia Postprocedure Evaluation (Signed)
Anesthesia Post Note  Patient: [name]  Procedure(s) Performed: DEBRIDEMENT WOUND (Left ) APPLICATION OF WOUND VAC (Left ) ULTRASOUND GUIDED MICROPUNCTURE VENOUS ACCESS  Patient location during evaluation: PACU Anesthesia Type: General Level of consciousness: awake and alert Pain management: pain level controlled Vital Signs Assessment: post-procedure vital signs reviewed and stable Respiratory status: spontaneous breathing and respiratory function stable Cardiovascular status: stable Anesthetic complications: no     Last Vitals:  Vitals:   04/21/17 1229  BP: (!) 164/67  Pulse: (!) 123  Resp: 14  Temp: (!) 36.4 C  SpO2: 100%    Last Pain:  Vitals:   04/21/17 1229  TempSrc: Oral                 KEPHART,WILLIAM K

## 2017-04-21 NOTE — Addendum Note (Signed)
Addendum  created 04/21/17 1533 by Alvin Critchley, MD   Order list changed, Order sets accessed

## 2017-04-22 ENCOUNTER — Encounter: Payer: Self-pay | Admitting: Vascular Surgery

## 2017-04-23 NOTE — Assessment & Plan Note (Signed)
Patient  is considered to be at low risk  For perioperative complications related to today's planned debridement under conscious sedation.  Based on today's exam and history.  Baseline lytes,  hgb and ekg done.  Lab Results  Component Value Date   NA 142 04/14/2017   K 3.6 04/14/2017   CL 111 04/14/2017   CO2 24 04/14/2017   Lab Results  Component Value Date   CREATININE 0.72 04/14/2017

## 2017-05-02 ENCOUNTER — Telehealth (INDEPENDENT_AMBULATORY_CARE_PROVIDER_SITE_OTHER): Payer: Self-pay | Admitting: Vascular Surgery

## 2017-05-02 NOTE — Telephone Encounter (Signed)
Nurse from [place] called stating the Wound vac from [employer] is not functioning well, she states that it keeps beeping and the daughter takes it off and does a wet to dry over the wound. She also states the wound doesn't look good. I gave her the number for [employer] [place] [employer].

## 2017-05-02 NOTE — Telephone Encounter (Signed)
New Message  Inez Catalina voiced she left message on nurse line and for someone to give her a call back in regards to pt and the pump.  Please f/u

## 2017-05-03 NOTE — Telephone Encounter (Signed)
Betty from [place] called stating the patient does not have electricity so she did a wet to dry and instructed the daughter on how to change it. She did ask when the power would be restored and the daughter was not sure when. James from [place] was bringing out a new vac today.

## 2017-05-03 NOTE — Telephone Encounter (Signed)
Spoke with the [place] and let her know Dr. Bunnie Domino recommendation. I also spoke with her regarding the wound vac, I let her know that I had spoken with Jeneen Rinks and he stated that the daughter was taking the vac off at night. The nurse said Jeneen Rinks had let her know that as well.

## 2017-05-09 ENCOUNTER — Telehealth (INDEPENDENT_AMBULATORY_CARE_PROVIDER_SITE_OTHER): Payer: Self-pay

## 2017-05-09 NOTE — Telephone Encounter (Signed)
Inez Catalina from [place] called to let us know the patient now has her power turned on but the daughter has refused to have the wound vac placed until she comes into our office on 05/12/17.

## 2017-05-12 ENCOUNTER — Ambulatory Visit (INDEPENDENT_AMBULATORY_CARE_PROVIDER_SITE_OTHER): Payer: Medicare PPO | Admitting: Vascular Surgery

## 2017-05-12 ENCOUNTER — Encounter (INDEPENDENT_AMBULATORY_CARE_PROVIDER_SITE_OTHER): Payer: Self-pay | Admitting: Vascular Surgery

## 2017-05-12 VITALS — BP 152/62 | HR 69 | Resp 17 | Ht 60.0 in | Wt 145.0 lb

## 2017-05-12 DIAGNOSIS — I7025 Atherosclerosis of native arteries of other extremities with ulceration: Secondary | ICD-10-CM

## 2017-05-12 DIAGNOSIS — E1149 Type 2 diabetes mellitus with other diabetic neurological complication: Secondary | ICD-10-CM

## 2017-05-12 NOTE — Progress Notes (Signed)
Subjective:    Patient ID: [name], female    DOB: [birth year], 83 y.o.   MRN: [id number] Chief Complaint  Patient presents with  . Follow-up    2wk [facility] wound check   Patient presents for her first postoperative follow-up.  She is status post irrigation and debridement of 3 left leg wounds including skin, soft tissue, and muscle encompassing approximately 70 cm combining all 3 wounds with negative pressure dressing placement. Nonviable tissue and tissue necrosis of left medial ankle, medial calf, and lateral calf wounds on April 21, 2017.  The patient has been receiving home nursing services there.  It seems that the patient's daughter was removing the VAC dressing at night due to the [facility] machine beeping.  The [place] was called and a representative was sent to the home to assess the VAC.  The [facility] machine is working fine however there seems to may possibly be an issue with the application of the dressing itself.  At this time, the patient is not receiving VAC dressings.  The home nurse was asked to place wet-to-dry dressings into the wound until we saw the patient.  The patient is a poor historian however does not states she is experiencing any pain to the left lower extremity.  The patient and her daughter who was present during this office visit denies any fever, nausea or vomiting.   Review of Systems  Constitutional: Negative.   HENT: Negative.   Eyes: Negative.   Respiratory: Negative.   Cardiovascular: Negative.   Gastrointestinal: Negative.   Endocrine: Negative.   Genitourinary: Negative.   Musculoskeletal: Negative.   Skin: Positive for wound.  Allergic/Immunologic: Negative.   Neurological: Negative.   Hematological: Negative.   Psychiatric/Behavioral: Negative.       Objective:   Physical Exam  Constitutional: She is oriented to person, place, and time. She appears well-developed and well-nourished. No distress.  HENT:  Head: Normocephalic and atraumatic.    Eyes: Conjunctivae are normal. Pupils are equal, round, and reactive to light.  Neck: Normal range of motion.  Cardiovascular: Normal rate, regular rhythm, normal heart sounds and intact distal pulses.  Pulses:      Radial pulses are 2+ on the right side, and 2+ on the left side.  Left lower extremity medial wound: Healing well.  There is granulation tissue noted in the wound bed.  There is no drainage. Left ankle wound: There is approximately 60% fibrinous exudate noted in the wound.  40% granulation tissue.  There is no drainage. Left lateral wound: Normal amount of granulation tissue.  90% fibrinous exudate noted.  Some necrotic tissue noted surrounding the wound. Skin surrounding each wound is healthy. Is no cellulitis to the leg.  There is no infection noted to the wounds.  Pulmonary/Chest: Effort normal and breath sounds normal.  Musculoskeletal: Normal range of motion. She exhibits no edema.  Neurological: She is alert and oriented to person, place, and time.  Skin: She is not diaphoretic.  Psychiatric: She has a normal mood and affect. Her behavior is normal. Judgment and thought content normal.  Vitals reviewed.  BP (!) 152/62   Pulse 69   Resp 17   Ht 5' (1.524 m)   Wt 145 lb (65.8 kg)   BMI 28.32 kg/m   Past Medical History:  Diagnosis Date  . Anxiety state 01/01/2015  . Arthritis    "all over"  . Breast cancer ([place])    Hx of R breast cancer s/p mastectomy; Currently has  L breast cancer (Diagnosed 2015).   . Breast cancer, left breast ([place]) 01/01/2015  . Decubitus ulcer of sacral region, stage 2 01/01/2015  . Decubitus ulcer, buttock   . Diabetes mellitus with insulin therapy ([place]) 01/01/2015  . Essential hypertension 01/01/2015  . Glaucoma 01/01/2015  . Glaucoma, bilateral   . History of breast cancer 01/01/2015  . History of DVT (deep vein thrombosis) 1980's   BLE  . HLD (hyperlipidemia) 01/01/2015  . Hyperlipidemia   . Hypertension   . Itchy scalp 02/10/2015  .  Osteoarthritis 01/01/2015  . Recurrent UTI 03/06/2015  . Right knee pain 02/10/2015  . TIA (transient ischemic attack) 02/18/2015  . Type II diabetes mellitus (HCC)    diet controlled   Social History   Socioeconomic History  . Marital status: Widowed    Spouse name: Not on file  . Number of children: Not on file  . Years of education: Not on file  . Highest education level: Not on file  Social Needs  . Financial resource strain: Not on file  . Food insecurity - worry: Not on file  . Food insecurity - inability: Not on file  . Transportation needs - medical: Not on file  . Transportation needs - non-medical: Not on file  Occupational History  . Not on file  Tobacco Use  . Smoking status: Never Smoker  . Smokeless tobacco: Never Used  Substance and Sexual Activity  . Alcohol use: No    Alcohol/week: 0.0 oz  . Drug use: No  . Sexual activity: No  Other Topics Concern  . Not on file  Social History Narrative   Lives with daughter/daughter's husband.   Denita Lung [phone]).    Past Surgical History:  Procedure Laterality Date  . ABDOMINAL HYSTERECTOMY    . APPLICATION OF WOUND VAC Left 04/21/2017   Procedure: APPLICATION OF WOUND VAC;  Surgeon: Algernon Huxley, MD;  Location: [facility] ORS;  Service: Vascular;  Laterality: Left;  . BREAST BIOPSY Right 1980's   +  . BREAST EXCISIONAL BIOPSY Left 2015   + stereo no surgery  . CHOLECYSTECTOMY    . LOWER EXTREMITY ANGIOGRAPHY Left 09/30/2016   Procedure: Lower Extremity Angiography;  Surgeon: Algernon Huxley, MD;  Location: [place] [facility];  Service: Cardiovascular;  Laterality: Left;  . LOWER EXTREMITY INTERVENTION  09/30/2016   Procedure: Lower Extremity Intervention;  Surgeon: Algernon Huxley, MD;  Location: [place] [facility];  Service: Cardiovascular;;  . MASTECTOMY Right 1980's  . MASTECTOMY Right   . MEDIAL PARTIAL KNEE REPLACEMENT Right 1980's?  . PERIPHERAL VASCULAR CATHETERIZATION  04/28/2015   Procedure: Lower  Extremity Intervention;  Surgeon: Algernon Huxley, MD;  Location: [place] CV LAB;  Service: Cardiovascular;;  . PERIPHERAL VASCULAR CATHETERIZATION N/A 04/28/2015   Procedure: Abdominal Aortogram w/Lower Extremity;  Surgeon: Algernon Huxley, MD;  Location: [place] CV LAB;  Service: Cardiovascular;  Laterality: N/A;  . PERIPHERAL VASCULAR CATHETERIZATION Left 10/20/2015   Procedure: Lower Extremity Angiography;  Surgeon: Algernon Huxley, MD;  Location: [place] [facility];  Service: Cardiovascular;  Laterality: Left;  . PERIPHERAL VASCULAR CATHETERIZATION  10/20/2015   Procedure: Lower Extremity Intervention;  Surgeon: Algernon Huxley, MD;  Location: [place] CV LAB;  Service: Cardiovascular;;  . WOUND DEBRIDEMENT Left 04/21/2017   Procedure: DEBRIDEMENT WOUND;  Surgeon: Algernon Huxley, MD;  Location: [facility] ORS;  Service: Vascular;  Laterality: Left;   Family History  Problem Relation Age of Onset  .  Heart disease Mother   . Diabetes Son   . Diabetes Son   . Kidney disease Son   . Kidney cancer Neg Hx   . Bladder Cancer Neg Hx   . Prostate cancer Neg Hx    No Known Allergies     Assessment & Plan:  Patient presents for her first postoperative follow-up.  She is status post irrigation and debridement of 3 left leg wounds including skin, soft tissue, and muscle encompassing approximately 70 cm combining all 3 wounds with negative pressure dressing placement. Nonviable tissue and tissue necrosis of left medial ankle, medial calf, and lateral calf wounds on April 21, 2017.  The patient has been receiving home nursing services there.  It seems that the patient's daughter was removing the VAC dressing at night due to the [facility] machine beeping.  The [place] was called and a representative was sent to the home to assess the VAC.  The [facility] machine is working fine however there seems to may possibly be an issue with the application of the dressing itself.  At this time, the patient is not receiving VAC  dressings.  The home nurse was asked to place wet-to-dry dressings into the wound until we saw the patient.  The patient is a poor historian however does not states she is experiencing any pain to the left lower extremity.  The patient and her daughter who was present during this office visit denies any fever, nausea or vomiting.  1. Atherosclerosis of native arteries of the extremities with ulceration ([place]) s/p wound debridement - New I told the daughter that the [facility] should be kept on the wound.  I encouraged her not to remove it at night.  I explained how the VAC assists in wound healing.  I have asked the visiting nurse to please call me in the office to discuss reapplying the [place].  If the patient's daughter refuses then at least placing some calcium alginate or Santyl into the wound to help assist with the removal of the fibrinous exudate. The patient is to follow up in 10 days.  If the lateral wound does not show any improvement in healing we may need to discuss the possibility of further debridement. The daughter made mention of possibly placing her mother into hospice care.  I asked the daughter to please keep [place] informed  2. DM (diabetes mellitus), type 2 with neurological complications (HCC) - Stable Encouraged good control as its slows the progression of atherosclerotic disease  Current Outpatient Medications on File Prior to Visit  Medication Sig Dispense Refill  . Alcohol Swabs (B-D SINGLE USE SWABS REGULAR) PADS Use as directed. 100 each 11  . amLODipine (NORVASC) 5 MG tablet TAKE 1 TABLET EVERY DAY (Patient taking differently: TAKE 86m EVERY DAY) 90 tablet 2  . aspirin 81 MG EC tablet Take 81 mg by mouth daily at 12 noon. Takes at noon    . B-D UF III MINI PEN NEEDLES 31G X 5 MM MISC Use when checking blood sugar. 100 each 11  . blood glucose meter kit and supplies Dispense a Accu Check meter..  E11.29 1 each 0  . brimonidine (ALPHAGAN P) 0.1 % SOLN PLACE 1 DROP INTO BOTH EYES 2 (TWO)  TIMES DAILY. 5 mL 1  . Calcium Carb-Cholecalciferol (CALCIUM 600 + D PO) Take 1 tablet by mouth daily.     . Calcium Polycarbophil (FIBERCON PO) Take 1 tablet by mouth every other day.    . Cholecalciferol (VITAMIN D3)  1000 UNITS CAPS Take 1,000 Units by mouth daily.     . clopidogrel (PLAVIX) 75 MG tablet TAKE 1 TABLET EVERY DAY 90 tablet 2  . CRANBERRY PO Take 1 capsule by mouth daily.     Marland Kitchen DIAPER RASH PRODUCTS EX Apply 1 application topically daily as needed (sores, diaper rash).     Marland Kitchen exemestane (AROMASIN) 25 MG tablet TAKE 1 TABLET EVERY DAY AT BEDTIME (Patient taking differently: TAKE 64m  EVERY DAY AT BEDTIME) 90 tablet 6  . Fluocinolone Acetonide 0.01 % SHAM Apply to scalp once daily; work into lCarMaxand allow to remain on scalp for ~5 minutes then rinse with water. (Patient taking differently: Apply 1 application topically every other day. Apply to scalp every other day; work into lCarMaxand allow to remain on scalp for ~5 minutes then rinse with water.) 120 mL 1  . glucose blood (COOL BLOOD GLUCOSE TEST STRIPS) test strip Use as instructed 100 each 12  . hydrALAZINE (APRESOLINE) 50 MG tablet TAKE 1 TABLET FOUR TIMES DAILY (Patient taking differently: TAKE 542mFOUR TIMES DAILY) 360 tablet 2  . ketoconazole (NIZORAL) 2 % shampoo Apply 1 application topically every other day.     . Lancets (ACCU-CHEK SOFT TOUCH) lancets Use as instructed 100 each 12  . LUMIGAN 0.01 % SOLN PLACE 1 DROP INTO BOTH EYES AT BEDTIME. 3 Bottle 0  . magnesium hydroxide (MILK OF MAGNESIA) 400 MG/5ML suspension Take 30 mLs by mouth daily as needed for constipation.    . Multiple Vitamins-Minerals (MULTIVITAMIN GUMMIES ADULT PO) Take 1 tablet by mouth daily.     . Marland Kitchenmeprazole (PRILOSEC) 20 MG capsule TAKE 1 CAPSULE EVERY DAY (Patient not taking: Reported on 04/21/2017) 90 capsule 2  . ranitidine (ZANTAC) 150 MG capsule TAKE 1 CAPSULE TWICE DAILY (Patient taking differently: TAKE 15031mWICE DAILY) 180 capsule 2  .  SANTYL ointment Apply 1 application topically daily. Apply to left shin wound with each dressing change.  0  . SIMBRINZA 1-0.2 % SUSP Place 1 drop into both eyes 2 (two) times daily.     . traMADol (ULTRAM) 50 MG tablet Take 1-2 tablets (50-100 mg total) by mouth every 8 (eight) hours as needed. (Patient taking differently: Take 50-100 mg by mouth every 8 (eight) hours as needed for moderate pain. ) 90 tablet 1  . zinc gluconate 50 MG tablet Take 50 mg by mouth daily.     No current facility-administered medications on file prior to visit.    There are no Patient Instructions on file for this visit. No Follow-up on file.  Mick Tanguma A Nykayla Marcelli, PA-C

## 2017-05-13 ENCOUNTER — Telehealth (INDEPENDENT_AMBULATORY_CARE_PROVIDER_SITE_OTHER): Payer: Self-pay

## 2017-05-13 NOTE — Telephone Encounter (Signed)
Phone number for Tricia:[phone] Soyla Murphy)  Vinnie Level called back and said that the wound VAC is not working at all to hold a seal and that she called back out to Ms. Ojo' home to try to reposition it on multiple calls today. Above is the number to the rep for [employer], the nurse suggests the [facility] from the representative above at [facility].

## 2017-05-13 NOTE — Telephone Encounter (Signed)
Nurse called to get clear orders for the wound VAC placement and follow up date. I read her the order over the phone, she took the verbal and said that she will be changing the wound vac 3 times weekly since your orders didn't specify how often it should be changed.  FYI----She was also advised that the daughter had previously refused the [facility] or didn't quite understand what it was used for.

## 2017-05-13 NOTE — Telephone Encounter (Signed)
I had a long conversation with the daughter. She should now understand. Three changes a week is standard and appropriate. The daughter was given a follow up date when she left the office yesterday (10 day wound check).

## 2017-05-16 NOTE — Telephone Encounter (Signed)
Vinnie Level called back to state that she is the nurse that's been out to the home to apply the wound VAC, and she states that it's simply not working. She states that it will not hold a seal at all. She requesting that you reach out to [facility] in order for the patient to get a new VAC from [facility], currently the patient is using a brand from [place], and it is not working.

## 2017-05-16 NOTE — Telephone Encounter (Signed)
A [employer] representative has come to the house and the [facility] machine is working fine. I may be the application of the VAC sponge. Perhaps another visiting nurse to assist with placement? During the patients (her daughter was preset) last visit last week, I explained to the daughter (who had been removed the VAC a night) its importance and not to remove it.

## 2017-05-17 NOTE — Telephone Encounter (Signed)
We are applying for a [employer] VAC today. Her insurance may not cover it - that is why we ordered an [employer] VAC originally. Please let the nurse know. Thank you.

## 2017-05-20 ENCOUNTER — Ambulatory Visit (INDEPENDENT_AMBULATORY_CARE_PROVIDER_SITE_OTHER): Payer: Medicare PPO | Admitting: Vascular Surgery

## 2017-05-20 ENCOUNTER — Telehealth (INDEPENDENT_AMBULATORY_CARE_PROVIDER_SITE_OTHER): Payer: Self-pay

## 2017-06-14 NOTE — Telephone Encounter (Signed)
[facility] called [facility] to let us know that the patient expired this morning and to let me know that she appreciated my help and patience with assisting her in trying to get the patient her wound VAC.

## 2017-06-14 DEATH — deceased

## 2017-07-28 ENCOUNTER — Ambulatory Visit: Payer: Self-pay | Admitting: Oncology

## 2017-08-11 ENCOUNTER — Ambulatory Visit: Payer: Self-pay

## 2017-08-19 ENCOUNTER — Ambulatory Visit (INDEPENDENT_AMBULATORY_CARE_PROVIDER_SITE_OTHER): Payer: Medicare PPO | Admitting: Vascular Surgery

## 2017-08-19 ENCOUNTER — Encounter (INDEPENDENT_AMBULATORY_CARE_PROVIDER_SITE_OTHER): Payer: Medicare PPO

## 2017-08-23 ENCOUNTER — Ambulatory Visit: Payer: Self-pay

## 2023-02-24 ENCOUNTER — Other Ambulatory Visit (HOSPITAL_COMMUNITY): Payer: Self-pay
